# Patient Record
Sex: Female | Born: 1950 | Race: White | Hispanic: No | Smoking: Former smoker
Health system: Southern US, Community
[De-identification: ages and names within clinical notes are randomized; demographics above are authoritative.]

## PROBLEM LIST (undated history)

## (undated) DIAGNOSIS — I890 Lymphedema, not elsewhere classified: Secondary | ICD-10-CM

## (undated) DIAGNOSIS — Z9189 Other specified personal risk factors, not elsewhere classified: Secondary | ICD-10-CM

## (undated) DIAGNOSIS — R51 Headache: Secondary | ICD-10-CM

## (undated) DIAGNOSIS — J302 Other seasonal allergic rhinitis: Secondary | ICD-10-CM

## (undated) DIAGNOSIS — F419 Anxiety disorder, unspecified: Secondary | ICD-10-CM

## (undated) DIAGNOSIS — K219 Gastro-esophageal reflux disease without esophagitis: Secondary | ICD-10-CM

## (undated) DIAGNOSIS — B009 Herpesviral infection, unspecified: Secondary | ICD-10-CM

## (undated) DIAGNOSIS — M1990 Unspecified osteoarthritis, unspecified site: Secondary | ICD-10-CM

## (undated) DIAGNOSIS — F329 Major depressive disorder, single episode, unspecified: Secondary | ICD-10-CM

## (undated) DIAGNOSIS — C801 Malignant (primary) neoplasm, unspecified: Secondary | ICD-10-CM

## (undated) DIAGNOSIS — Z5111 Encounter for antineoplastic chemotherapy: Secondary | ICD-10-CM

## (undated) DIAGNOSIS — Z923 Personal history of irradiation: Secondary | ICD-10-CM

## (undated) DIAGNOSIS — C50919 Malignant neoplasm of unspecified site of unspecified female breast: Secondary | ICD-10-CM

## (undated) DIAGNOSIS — T7840XA Allergy, unspecified, initial encounter: Secondary | ICD-10-CM

## (undated) DIAGNOSIS — I10 Essential (primary) hypertension: Secondary | ICD-10-CM

## (undated) DIAGNOSIS — Z91B Personal risk factor of exposure to diethylstilbestrol: Secondary | ICD-10-CM

## (undated) HISTORY — DX: Essential (primary) hypertension: I10

## (undated) HISTORY — DX: Gastro-esophageal reflux disease without esophagitis: K21.9

## (undated) HISTORY — DX: Major depressive disorder, single episode, unspecified: F32.9

## (undated) HISTORY — DX: Encounter for antineoplastic chemotherapy: Z51.11

## (undated) HISTORY — DX: Herpesviral infection, unspecified: B00.9

## (undated) HISTORY — DX: Personal risk factor of exposure to diethylstilbestrol: Z91.B

## (undated) HISTORY — DX: Malignant (primary) neoplasm, unspecified: C80.1

## (undated) HISTORY — DX: Other specified personal risk factors, not elsewhere classified: Z91.89

---

## 1997-11-28 ENCOUNTER — Other Ambulatory Visit: Admission: RE | Admit: 1997-11-28 | Discharge: 1997-11-28 | Payer: Self-pay | Admitting: *Deleted

## 1999-03-14 ENCOUNTER — Ambulatory Visit (HOSPITAL_COMMUNITY): Admission: RE | Admit: 1999-03-14 | Discharge: 1999-03-14 | Payer: Self-pay | Admitting: Gastroenterology

## 1999-04-16 ENCOUNTER — Other Ambulatory Visit: Admission: RE | Admit: 1999-04-16 | Discharge: 1999-04-16 | Payer: Self-pay | Admitting: *Deleted

## 1999-11-13 ENCOUNTER — Encounter: Payer: Self-pay | Admitting: *Deleted

## 1999-11-13 ENCOUNTER — Encounter: Admission: RE | Admit: 1999-11-13 | Discharge: 1999-11-13 | Payer: Self-pay | Admitting: *Deleted

## 2002-08-12 ENCOUNTER — Encounter: Admission: RE | Admit: 2002-08-12 | Discharge: 2002-08-12 | Payer: Self-pay | Admitting: *Deleted

## 2002-08-12 ENCOUNTER — Encounter: Payer: Self-pay | Admitting: *Deleted

## 2011-07-10 ENCOUNTER — Ambulatory Visit: Payer: Self-pay | Admitting: Internal Medicine

## 2011-07-10 VITALS — BP 149/91 | HR 77 | Temp 97.7°F | Resp 16 | Ht 63.5 in | Wt 160.0 lb

## 2011-07-10 DIAGNOSIS — N6459 Other signs and symptoms in breast: Secondary | ICD-10-CM

## 2011-07-10 DIAGNOSIS — Z719 Counseling, unspecified: Secondary | ICD-10-CM

## 2011-07-10 DIAGNOSIS — N6452 Nipple discharge: Secondary | ICD-10-CM

## 2011-07-10 DIAGNOSIS — N63 Unspecified lump in unspecified breast: Secondary | ICD-10-CM

## 2011-07-10 DIAGNOSIS — N644 Mastodynia: Secondary | ICD-10-CM

## 2011-07-10 DIAGNOSIS — Z789 Other specified health status: Secondary | ICD-10-CM | POA: Insufficient documentation

## 2011-07-10 NOTE — Patient Instructions (Signed)
Galactorrhea Galactorrhea is when there is a milky nipple discharge. It is different from normal milk in nursing mothers. It usually comes from both nipples. Galactorrhea is not a disease but may be a symptom of a problem. It may continue for years after weaning. Galactorrhea is caused by the hormone prolactin, which stimulates milk production. If the breast discharge looks like pus, is bloody or if there is a lump present in the affected breast, the discharge may be caused by other problems including:  A benign cyst.   Papilloma.   Breast cancer.   A breast infection.   A breast abscess.  It can also be seen in men who have a low or absent female hormone (testosterone) level. Galactorrhea can be present in a newborn if the mother had high female hormone (estrogen) levels that crossed into the baby through the placenta. The baby usually has enlarged breasts, but in time, it all goes away on its own. CAUSES   Tumor of the pituitary gland in the brain.   Problems with the hypothalamus in the brain that stimulates the pituitary gland.   Low thyroid function (hypothyroid disease).   Chronic kidney failure.   Medications, antidepressants, tranquilizers and blood pressure medication.   Herbal medications (nettle, fennel, blessed thistle, anise and fenugreek seed).   Illegal drugs (marijuana and opiates).   Breast stimulation during sexual activity or too many and frequent self breast exams.   Birth control pills.   Surgery or trauma to the breast causing nerve damage.   Spinal cord injury.  SYMPTOMS   White, yellow or green discharge from one or both breasts.   No menstrual period (amenorrhea) or infrequent menstrual periods (hypomenorrhea).   Hot flashes, lack of sexual desire or vaginal dryness.   Infertility in women and men.   Headaches and vision problems.   Decrease in calcium in your bones (developing osteopenia or osteoporosis).  DIAGNOSIS  Your caregiver may be able  to know your problem by taking a detailed history and physical exam of you. Tests that may be done, include:  Blood tests to check for the prolactin hormone, your female and thyroid hormones and a pregnancy test.   A detailed eye exam.   Mammogram.   X-rays, CT scan or MRI of breasts or your brain looking for a tumor.  TREATMENT   Stopping medications that may be causing the galactorrhea.   Treating low thyroid function with thyroid hormones.   Medical or surgical (if necessary) treatment of a pituitary gland tumor.   Medication to lower the prolactin hormone level when no cause can be found.   Surgery as a last resort to remove the breasts ducts if the discharge persists with treatment and is a problem.   Treatment may not be necessary if you are not bothered by the breast discharge.  HOME CARE INSTRUCTIONS   Before seeing your caregiver, make a list of all your symptoms, medications, when the breast discharge started and questions you may have.   Avoid breast stimulation during sexual activity.   Perform breast self exam once a month.   Avoid clothes that rub on your nipples.   Use breasts pads to absorb the discharge.   Wear a support bra.  SEEK MEDICAL CARE IF:   You have galactorrhea and you are trying to get pregnant.   You develop hot flashes, vaginal dryness or lack of sexual desire.   You stop having menstrual periods or they are irregular or far apart.     You have headaches.   You have vision problems.  SEEK IMMEDIATE MEDICAL CARE IF:   Your breast discharge is bloody or pus-like.   You have breast pain.   You feel a lump in your breast.   Your breast shows wrinkling or dimpling.   Your breast becomes red and swollen.  Document Released: 05/22/2004 Document Revised: 04/03/2011 Document Reviewed: 04/04/2008 Exit[employer] Patient Information 2012 [employer]. 

## 2011-07-10 NOTE — Progress Notes (Signed)
  Subjective:    Patient ID: Rachael Weaver, female    DOB: August 04, 1950, 61 y.o.   MRN: [id number]  HPI Has 3 weeks of new breast sxs. Has discharge and pain R breast. Repeat BP 146/96. Exercises daily, no smoke , eats healthy.   Review of Systems Migrains controlled OTC    Objective:   Physical Exam  Constitutional: She is oriented to person, place, and time. She appears well-developed and well-nourished.  Eyes: EOM are normal.  Neck: Normal range of motion. Neck supple. No thyromegaly present.  Cardiovascular: Normal rate, regular rhythm and normal heart sounds.   Pulmonary/Chest: Effort normal and breath sounds normal. Right breast exhibits mass, nipple discharge and tenderness. Right breast exhibits no inverted nipple and no skin change. Left breast exhibits tenderness. Left breast exhibits no inverted nipple, no mass, no nipple discharge and no skin change.       Both breasts appear normal to inspection. R breast has a superior mobile , irregular, tender, mass. L normal  Neurological: She is alert and oriented to person, place, and time.  Skin: Skin is warm and dry. No rash noted.  Psychiatric: She has a normal mood and affect.          Assessment & Plan:   Order stat US and diagnostic mammogram Schedule CPE Monitor home BP DASH diet

## 2011-07-18 ENCOUNTER — Ambulatory Visit
Admission: RE | Admit: 2011-07-18 | Discharge: 2011-07-18 | Disposition: A | Discharge status: Home / Self Care | Payer: Self-pay | Source: Ambulatory Visit | Attending: Internal Medicine | Admitting: Internal Medicine

## 2011-07-18 ENCOUNTER — Other Ambulatory Visit: Payer: Self-pay | Admitting: Internal Medicine

## 2011-07-18 ENCOUNTER — Other Ambulatory Visit: Payer: Self-pay | Admitting: Obstetrics and Gynecology

## 2011-07-18 DIAGNOSIS — N6452 Nipple discharge: Secondary | ICD-10-CM

## 2011-07-18 DIAGNOSIS — N63 Unspecified lump in unspecified breast: Secondary | ICD-10-CM

## 2011-07-18 DIAGNOSIS — N6310 Unspecified lump in the right breast, unspecified quadrant: Secondary | ICD-10-CM

## 2011-07-22 ENCOUNTER — Other Ambulatory Visit: Payer: Self-pay | Admitting: Obstetrics and Gynecology

## 2011-07-22 ENCOUNTER — Ambulatory Visit
Admission: RE | Admit: 2011-07-22 | Discharge: 2011-07-22 | Disposition: A | Discharge status: Home / Self Care | Payer: No Typology Code available for payment source | Source: Ambulatory Visit | Attending: Obstetrics and Gynecology | Admitting: Obstetrics and Gynecology

## 2011-07-22 ENCOUNTER — Ambulatory Visit (INDEPENDENT_AMBULATORY_CARE_PROVIDER_SITE_OTHER): Payer: Self-pay | Admitting: *Deleted

## 2011-07-22 VITALS — BP 142/94 | HR 85 | Temp 98.0°F | Ht 64.0 in | Wt 161.2 lb

## 2011-07-22 DIAGNOSIS — Z01419 Encounter for gynecological examination (general) (routine) without abnormal findings: Secondary | ICD-10-CM

## 2011-07-22 DIAGNOSIS — N63 Unspecified lump in unspecified breast: Secondary | ICD-10-CM

## 2011-07-22 DIAGNOSIS — N6310 Unspecified lump in the right breast, unspecified quadrant: Secondary | ICD-10-CM

## 2011-07-22 NOTE — Patient Instructions (Addendum)
Taught patient how to perform BSE. Let her know [facility] will cover Pap smears every 3 years unless has a history of abnormal Pap smears. Patient is scheduled for a right breast biopsy this afternoon at 1400. Patient aware of appointment and will be there. Let patient know will follow up with her within the next couple weeks with results.Talked with patient about blood pressure since elevated today. Per patient it has been running around 140/90 and spoken to her doctor about that. Patient encouraged to check blood pressure regularly and to follow up with PCP. Patient verbalized understanding.

## 2011-07-22 NOTE — Progress Notes (Signed)
Complaints of right breast lump. Patient referred from the [facility] [place]. Diagnostic mammogram was completed 07/18/11.  Pap Smear:    Pap smear completed today. Per patient last Pap smear was 6 years ago and normal. Per patient no history of abnormal Pap smears. Patient stated her mother took DES while pregnant for her and has had multiple colposcopies due to that. No Pap smear results in EPIC.  Physical exam: Breasts Breasts symmetrical. No skin abnormalities bilateral breasts. No nipple retraction bilateral breasts. No nipple discharge bilateral breasts. Patient stated she has had some clear right nipple discharge a couple of weeks ago. No lymphadenopathy. No lumps palpated left breast. Palpated lump in right breast at 12 o'clock 7 cm from the nipple. Patient complained of pain on palpation of lump. Patient scheduled for a right breast biopsy today at 1400 at the [facility] [place].          Pelvic/Bimanual   Ext Genitalia No lesions, no swelling and no discharge observed on external genitalia.         Vagina Vagina pink and normal texture. No lesions or discharge observed in vagina.          Cervix Cervix is present. Cervix pink and of normal texture. No discharge observed.     Uterus Uterus is present and palpable. Uterus in normal position and normal size.        Adnexae Bilateral ovaries present and palpable. No tenderness on palpation.          Rectovaginal No rectal exam completed today since patient had no rectal complaints. No skin abnormalities observed on exam.

## 2011-07-23 ENCOUNTER — Other Ambulatory Visit: Payer: Self-pay | Admitting: Obstetrics and Gynecology

## 2011-07-23 DIAGNOSIS — C50911 Malignant neoplasm of unspecified site of right female breast: Secondary | ICD-10-CM

## 2011-07-24 ENCOUNTER — Telehealth: Payer: Self-pay | Admitting: *Deleted

## 2011-07-24 ENCOUNTER — Other Ambulatory Visit: Payer: Self-pay | Admitting: *Deleted

## 2011-07-24 DIAGNOSIS — C50419 Malignant neoplasm of upper-outer quadrant of unspecified female breast: Secondary | ICD-10-CM

## 2011-07-24 DIAGNOSIS — C50411 Malignant neoplasm of upper-outer quadrant of right female breast: Secondary | ICD-10-CM | POA: Insufficient documentation

## 2011-07-24 DIAGNOSIS — Z170 Estrogen receptor positive status [ER+]: Secondary | ICD-10-CM | POA: Insufficient documentation

## 2011-07-24 NOTE — Telephone Encounter (Signed)
Confirmed [facility] for 07/30/11 at 0800.  Instructions and contact information given.  

## 2011-07-25 ENCOUNTER — Ambulatory Visit
Admission: RE | Admit: 2011-07-25 | Discharge: 2011-07-25 | Disposition: A | Discharge status: Home / Self Care | Payer: No Typology Code available for payment source | Source: Ambulatory Visit | Attending: Obstetrics and Gynecology | Admitting: Obstetrics and Gynecology

## 2011-07-25 DIAGNOSIS — C50911 Malignant neoplasm of unspecified site of right female breast: Secondary | ICD-10-CM

## 2011-07-25 MED ORDER — GADOBENATE DIMEGLUMINE 529 MG/ML IV SOLN
15.0000 mL | Freq: Once | INTRAVENOUS | Status: AC | PRN
  Administered 2011-07-25: 15 mL via INTRAVENOUS

## 2011-07-30 ENCOUNTER — Encounter: Payer: Self-pay | Admitting: Oncology

## 2011-07-30 ENCOUNTER — Ambulatory Visit (HOSPITAL_BASED_OUTPATIENT_CLINIC_OR_DEPARTMENT_OTHER): Payer: Self-pay | Admitting: Oncology

## 2011-07-30 ENCOUNTER — Ambulatory Visit: Payer: Medicaid Other | Attending: Surgery | Admitting: Physical Therapy

## 2011-07-30 ENCOUNTER — Other Ambulatory Visit (INDEPENDENT_AMBULATORY_CARE_PROVIDER_SITE_OTHER): Payer: Self-pay | Admitting: Surgery

## 2011-07-30 ENCOUNTER — Ambulatory Visit: Payer: Self-pay

## 2011-07-30 ENCOUNTER — Other Ambulatory Visit: Payer: Self-pay | Admitting: Lab

## 2011-07-30 ENCOUNTER — Encounter: Payer: Self-pay | Admitting: *Deleted

## 2011-07-30 ENCOUNTER — Ambulatory Visit
Admission: RE | Admit: 2011-07-30 | Discharge: 2011-07-30 | Disposition: A | Discharge status: Home / Self Care | Payer: Medicaid Other | Source: Ambulatory Visit | Attending: Radiation Oncology | Admitting: Radiation Oncology

## 2011-07-30 ENCOUNTER — Ambulatory Visit (HOSPITAL_BASED_OUTPATIENT_CLINIC_OR_DEPARTMENT_OTHER): Payer: PRIVATE HEALTH INSURANCE | Admitting: Surgery

## 2011-07-30 VITALS — BP 151/92 | HR 78 | Temp 98.3°F | Ht 64.0 in | Wt 160.9 lb

## 2011-07-30 VITALS — BP 151/92 | HR 78 | Temp 98.3°F | Wt 160.0 lb

## 2011-07-30 DIAGNOSIS — C50419 Malignant neoplasm of upper-outer quadrant of unspecified female breast: Secondary | ICD-10-CM

## 2011-07-30 DIAGNOSIS — IMO0001 Reserved for inherently not codable concepts without codable children: Secondary | ICD-10-CM | POA: Insufficient documentation

## 2011-07-30 DIAGNOSIS — R2230 Localized swelling, mass and lump, unspecified upper limb: Secondary | ICD-10-CM

## 2011-07-30 DIAGNOSIS — R293 Abnormal posture: Secondary | ICD-10-CM | POA: Insufficient documentation

## 2011-07-30 LAB — CBC WITH DIFFERENTIAL/PLATELET
Basophils Absolute: 0 10*3/uL (ref 0.0–0.1)
Eosinophils Absolute: 0.1 10*3/uL (ref 0.0–0.5)
HCT: 44.6 % (ref 34.8–46.6)
HGB: 15.2 g/dL (ref 11.6–15.9)
LYMPH%: 40 % (ref 14.0–49.7)
MCV: 89.9 fL (ref 79.5–101.0)
MONO%: 7.6 % (ref 0.0–14.0)
NEUT#: 3 10*3/uL (ref 1.5–6.5)
NEUT%: 49.9 % (ref 38.4–76.8)
Platelets: 240 10*3/uL (ref 145–400)
RBC: 4.96 10*6/uL (ref 3.70–5.45)

## 2011-07-30 LAB — COMPREHENSIVE METABOLIC PANEL
BUN: 16 mg/dL (ref 6–23)
CO2: 27 mEq/L (ref 19–32)
Calcium: 9.7 mg/dL (ref 8.4–10.5)
Creatinine, Ser: 0.8 mg/dL (ref 0.50–1.10)
Glucose, Bld: 107 mg/dL — ABNORMAL HIGH (ref 70–99)
Total Bilirubin: 0.6 mg/dL (ref 0.3–1.2)

## 2011-07-30 NOTE — Progress Notes (Signed)
Patient came in today as a new patient,she has no insurance.I gave her an epp application to fill out and return to [place].

## 2011-07-30 NOTE — Progress Notes (Signed)
Referral MD Dr Ivor Reining; Dr Patrina Levering Reason for Referral: 61 year old woman who presents with nipple discharge and breast pain.    Chief Complaint  Patient presents with  . Breast Cancer  : This patient has not had mammogram in September 2001. She had presented there because of pain and nipple discharge. Diagnostic mammogram right breast ultrasound on 07/18/2011 showed a spiculated hypoechoic mass at 12:00, 7 cm from the nipple measuring 2.9 x 2.8 x 1.5 cm. This was palpable as well. Biopsy performed on 07/22/2011 showed this to be ER and PR positive at 100 and 59% respectively., Proliferative index 66% and HER-2 positive at 2.82. MRI scan of both breasts performed 07/25/2011 showed a mass measuring 2.9 x 2.6 x 1.8 cm at 12:00 9 cm from nipple. The right axilla there is a level I lymph node measuring 15 mm which is thickened cortex. Was not identified on ultrasound. This will likely require biopsy under MRI guidance.   HPI:   Past Medical History  Diagnosis Date  . Hypertension     borderline- no meds  :  Past Surgical History  Procedure Date  . Cesarean section   :  Current outpatient prescriptions:aspirin-acetaminophen-caffeine (EXCEDRIN MIGRAINE) 250-250-65 MG per tablet, Take 2 tablets by mouth as needed., Disp: , Rfl: ;  fexofenadine (ALLEGRA) 180 MG tablet, Take 180 mg by mouth as needed., Disp: , Rfl: :    :  No Known Allergies:  Family History  Problem Relation Age of Onset  . Diabetes Paternal Grandfather   . Heart disease Paternal Grandfather   . Breast cancer Maternal Grandmother Age 56's  . Cancer Maternal Grandmother   . Hypertension Father   . Stroke Father   . Breast cancer Maternal Aunt Age 23's  . Cancer Maternal Aunt   :  History   Social History  . Marital Status: Single, divorced x 66 y    Spouse Name: N/A    Number of Children: 2- 46, 7 yo  . Years of Education: N/A   Occupational History  . Not on file.  Teaching laboratory technician    Social History Main Topics  . Smoking status: Former Smoker -- 1.0 packs/day    Quit date: 04/29/1971  . Smokeless tobacco: Not on file  . Alcohol Use: 0.0 oz/week    0-1 Glasses of wine per week     rarely  . Drug Use: No  . Sexually Active: Not Currently    Birth Control/ Protection: None   Other Topics Concern  . Not on file   Social History Narrative  . No narrative on file5 brothers, both parents deceased; 1 brother died of HIV  :  @Reproductive  History@ G5P1 Menarche 15 Menopause since 2005 Hx of DEX exposure; extensive f/u till mid 9's A comprehensive review of systems was negative. She has lost 50 lbs over 2 y Exam:  @IPVITALS @ General appearance: alert, cooperative and appears stated age Eyes: conjunctivae/corneas clear. PERRL, EOM's intact. Fundi benign. Throat: lips, mucosa, and tongue normal; teeth and gums normal Resp: clear to auscultation bilaterally and normal percussion bilaterally Breasts: normal appearance, no masses or tenderness, mass ~2 cm upper outer rt breast Cardio: regular rate and rhythm, S1, S2 normal, no murmur, click, rub or gallop and normal apical impulse GI: soft, non-tender; bowel sounds normal; no masses,  no organomegaly Extremities: extremities normal, atraumatic, no cyanosis or edema Pulses: 2+ and symmetric Skin: Skin color, texture, turgor normal. No rashes or lesions Lymph nodes: Cervical, supraclavicular, and  axillary nodes normal. Neurologic: Grossly normal   Basename 07/30/11 0812  WBC 6.1  HGB 15.2  HCT 44.6  PLT 240    Basename 07/30/11 0812  NA 138  K 3.9  CL 102  CO2 27  GLUCOSE 107*  BUN 16  CREATININE 0.80  CALCIUM 9.7    Blood smear review: n/a  Pathology:as above  US Breast Right  07/18/2011  *RADIOLOGY REPORT*  Clinical Data:  Questioned palpable finding right breast 12 o'clock location  DIGITAL DIAGNOSTIC BILATERAL MAMMOGRAM WITH CAD AND RIGHT BREAST ULTRASOUND:  Comparison:  None.  Findings:   The breast parenchymal pattern demonstrates scattered fibroglandular densities.  There is an irregular spiculated mass in the right breast 12 o'clock location at the site of the questioned palpable finding.  No suspicious finding is seen in the left breast.  There is asymmetric benign appearing glandular tissue in the left upper outer quadrant incidentally noted. Mammographic images were processed with CAD.  On physical exam, I palpate a firm mass like thickening in the right breast 12 o'clock location.  Ultrasound is performed, showing a spiculated irregular hypoechoic shadowing mass with a component of adjacent satellite nodules in the right breast 12 o'clock location 7 cm from the nipple measuring 2.9 x 2.8 x 1.5 cm.  This corresponds to the mammographic and palpable finding. No right axillary lymphadenopathy is identified.  IMPRESSION: Suspicious right breast mass, 12 o'clock location.  Ultrasound- guided core biopsy will be performed after the patient is enrolled in the [facility] program.  She has an appointment 07/22/2011 at a.m. at [facility] for program involvement. Findings and recommendations discussed with the patient and provided in written form at the time of the exam.  I also called these findings to Dr. Perrin Maltese at the time of the exam.  BI-RADS CATEGORY 5:  Highly suggestive of malignancy - appropriate action should be taken.  Recommendation:  Right breast biopsy, ultrasound-guided  Original Report Authenticated By: Harrel Lemon, M.D.   Mr Breast Bilateral W Wo Contrast  07/25/2011  *RADIOLOGY REPORT*  Clinical Data: new diagnosis of breast cancer on the right  BUN and creatinine were obtained on site at [facility] at [address] Results:  BUN 14 mg/dL,  Creatinine 0.9 mg/dL.  BILATERAL BREAST MRI WITH AND WITHOUT CONTRAST  Technique: Multiplanar, multisequence MR images of both breasts were obtained prior to and following the intravenous administration of 15ml of Multihance.   Three dimensional images were evaluated at the independent DynaCad workstation.  Comparison:  07/18/11 mammogram  Findings: There is moderate symmetric bilateral background parenchymal enhancement.  There are no suspicious findings in the left breast.  On the right, there is a biopsy marker clip associated with a mass in the 12 o'clock position, 9cm from the nipple.  The mass demonstrates an irregular shape with spiculated margins, rapid initial phase enhancement, and a mixture of different delayed phase kinetics including washout.  The mass measures 29 x 26 x 18mm. There are no other masses in the right breast.  In the upper right axilla, there is a 15mm level I lymph node that demonstrates thickened cortex and loss of normal hilum.  It is hypervascular with rapid enhancment and washout.  IMPRESSION: Known invasive carcinoma in the 12 o'clock right breast, with morphologically suspicious right axillary lymph node.  BI-RADS CATEGORY 6:  Known biopsy-proven malignancy - appropriate action should be taken.  THREE-DIMENSIONAL MR IMAGE RENDERING ON INDEPENDENT WORKSTATION:  Three-dimensional MR images were rendered by post-processing  of the original MR data on an independent workstation.  The three- dimensional MR images were interpreted, and findings were reported in the accompanying complete MRI report for this study.  Original Report Authenticated By: [email or web address]   [place] Core Biopsy  07/23/2011  *RADIOLOGY REPORT*  Clinical Data:  2.9 x 2.8 x 1.5 cm spiculated mass at 12 o'clock 7 cm from the right nipple  ULTRASOUND GUIDED CORE BIOPSY OF THE RIGHT BREAST  The patient and I discussed the procedure of ultrasound-guided biopsy, including benefits and alternatives.  We discussed the high likelihood of a successful procedure. We discussed the risks of the procedure, including infection, bleeding, tissue injury, clip migration and inadequate sampling.  Informed written consent was given.  Using sterile technique, 2% lidocaine,  ultrasound guidance and a 14 gauge automated biopsy device, biopsy was performed of the mass at 12 o'clock in the right breast.  At the conclusion of the procedure, a ribbon tissue marker clip was deployed into the biopsy cavity.  Follow up 2 view mammogram was performed and dictated separately.  Histologic evaluation demonstrates invasive mammary carcinoma which is concordant with the imaging findings.  Report was discussed with the patient by telephone at her request.  Patient is very claustrophobic; she has been scheduled for possible breast MRI on 07/25/2011.  She has been scheduled to be seen in the the [facility] on 07/30/2011.  She reports no complications from the procedure.  IMPRESSION: Ultrasound guided biopsy of a mass at 12 o'clock in the right breast.  Invasive mammary carcinoma is diagnosed.  The patient has been scheduled to be seen in the [facility] on 07/30/2011.  We will attempt to breast MRI on 07/25/2011; the patient is very claustrophobic and may not be able to complete the exam.  No apparent complications.  Original Report Authenticated By: Daryl Eastern, M.D.   Mm Digital Diagnostic Bilat  07/18/2011  *RADIOLOGY REPORT*  Clinical Data:  Questioned palpable finding right breast 12 o'clock location  DIGITAL DIAGNOSTIC BILATERAL MAMMOGRAM WITH CAD AND RIGHT BREAST ULTRASOUND:  Comparison:  None.  Findings:  The breast parenchymal pattern demonstrates scattered fibroglandular densities.  There is an irregular spiculated mass in the right breast 12 o'clock location at the site of the questioned palpable finding.  No suspicious finding is seen in the left breast.  There is asymmetric benign appearing glandular tissue in the left upper outer quadrant incidentally noted. Mammographic images were processed with CAD.  On physical exam, I palpate a firm mass like thickening in the right breast 12 o'clock location.  Ultrasound  is performed, showing a spiculated irregular hypoechoic shadowing mass with a component of adjacent satellite nodules in the right breast 12 o'clock location 7 cm from the nipple measuring 2.9 x 2.8 x 1.5 cm.  This corresponds to the mammographic and palpable finding. No right axillary lymphadenopathy is identified.  IMPRESSION: Suspicious right breast mass, 12 o'clock location.  Ultrasound- guided core biopsy will be performed after the patient is enrolled in the [facility] program.  She has an appointment 07/22/2011 at a.m. at [facility] for program involvement. Findings and recommendations discussed with the patient and provided in written form at the time of the exam.  I also called these findings to Dr. Perrin Maltese at the time of the exam.  BI-RADS CATEGORY 5:  Highly suggestive of malignancy - appropriate action should be taken.  Recommendation:  Right breast biopsy, ultrasound-guided  Original Report Authenticated By: Estevan Oaks  GREEN, M.D.   Mm Digital Diagnostic Unilat R  07/22/2011  *RADIOLOGY REPORT*  Clinical Data:  Ultrasound-guided core needle biopsy of a mass at 12 o'clock in the right breast with clip placement  DIGITAL DIAGNOSTIC RIGHT MAMMOGRAM  Comparison:  None.  Findings:  Films are performed following ultrasound guided biopsy of a spiculated mass at 12 o'clock 7 cm from the right nipple.  The ribbon clip is appropriately positioned.  IMPRESSION: Appropriate clip placement following ultrasound-guided core needle biopsy of a mass at 12 o'clock 7 cm from the right nipple.  Original Report Authenticated By: Daryl Eastern, M.D.    Assessment and Plan:  Pleasant 61 year old woman who presents with ER/PR positive and HER-2 positive breast cancer. With a fairly lengthy discussion today regarding neoadjuvant therapy. She ultimately elect to go ahead with lumpectomy upfront followed by definitive therapy. We will go ahead with imaging studies, 2-D echo, chemotherapy teaching and port placement at  the time of surgery. I will therefore see her after she's had all these interventions to further define our treatment plan. I idea to lead to of chemotherapy side effects and choice of treatment and with Herceptin this setting. We also discussed side effects of hormonal therapy to some degree. She understands to go ahead of all these measures and is anxious to proceed.   45 minutes were spent with this patient half the time and patient-related counseling.  Pierce Crane M.D. FRCP C.

## 2011-07-30 NOTE — Progress Notes (Signed)
Patient ID: Rachael Weaver, female   DOB: 11/23/1950, 61 y.o.   MRN: [id number]  Chief Complaint  Patient presents with  . Breast Cancer    right    HPI Rachael Weaver is a 61 y.o. female.  She was referred by her primary physician for evaluation of a newly diagnosed right breast cancer. The patient has been having some increasing pain in her breasts particularly the right side. This prompted a mammogram and further evaluation has shown an invasive ductal carcinoma, receptor positive, HER-2 positive, 66% Ki-67. She is seen today for discussion about treatment options. She has a family history of breast cancer and is also scheduled to see genetic counselor. She has already been seen by the medical oncologist, Dr. Peter Rubin HPI  Past Medical History  Diagnosis Date  . Hypertension     borderline- no meds    Past Surgical History  Procedure Date  . Cesarean section     Family History  Problem Relation Age of Onset  . Diabetes Paternal Grandfather   . Heart disease Paternal Grandfather   . Breast cancer Maternal Grandmother   . Cancer Maternal Grandmother   . Hypertension Father   . Stroke Father   . Breast cancer Maternal Aunt   . Cancer Maternal Aunt     Social History History  Substance Use Topics  . Smoking status: Former Smoker -- 1.0 packs/day    Quit date: 04/29/1971  . Smokeless tobacco: Not on file  . Alcohol Use: 0.0 oz/week    0-1 Glasses of wine per week     rarely    No Known Allergies  Current Outpatient Prescriptions  Medication Sig Dispense Refill  . aspirin-acetaminophen-caffeine (EXCEDRIN MIGRAINE) 250-250-65 MG per tablet Take 2 tablets by mouth as needed.      . fexofenadine (ALLEGRA) 180 MG tablet Take 180 mg by mouth as needed.        Review of Systems Review of Systems  Constitutional: Negative for fever, chills and unexpected weight change.  HENT: Negative for hearing loss, congestion, sore throat, trouble swallowing and voice change.   Eyes:  Negative for visual disturbance.  Respiratory: Negative for cough and wheezing.   Cardiovascular: Negative for chest pain, palpitations and leg swelling.  Gastrointestinal: Negative for nausea, vomiting, abdominal pain, diarrhea, constipation, blood in stool, abdominal distention and anal bleeding.  Genitourinary: Negative for hematuria, vaginal bleeding and difficulty urinating.  Musculoskeletal: Positive for arthralgias.  Skin: Negative for rash and wound.  Neurological: Negative for seizures, syncope and headaches.  Hematological: Negative for adenopathy. Does not bruise/bleed easily.  Psychiatric/Behavioral: Negative for confusion.    Blood pressure 151/92, pulse 78, temperature 98.3 F (36.8 C), weight 160 lb (72.576 kg).  Physical Exam Wt Readings from Last 3 Encounters:  07/30/11 160 lb (72.576 kg)  07/30/11 160 lb 14.4 oz (72.984 kg)  07/22/11 161 lb 3.2 oz (73.12 kg)   Temp Readings from Last 3 Encounters:  07/30/11 98.3 F (36.8 C)   07/30/11 98.3 F (36.8 C)   07/22/11 98 F (36.7 C) Oral   BP Readings from Last 3 Encounters:  07/30/11 151/92  07/30/11 151/92  07/22/11 142/94   Pulse Readings from Last 3 Encounters:  07/30/11 78  07/30/11 78  07/22/11 85    Physical Exam  Vitals reviewed. Constitutional: She is oriented to person, place, and time. She appears well-developed and well-nourished. No distress.  HENT:  Head: Normocephalic and atraumatic.  Mouth/Throat: Oropharynx is clear and moist.  Eyes: Conjunctivae   and EOM are normal. Pupils are equal, round, and reactive to light. No scleral icterus.  Neck: Normal range of motion. Neck supple. No tracheal deviation present. No thyromegaly present.  Cardiovascular: Normal rate, regular rhythm, normal heart sounds and intact distal pulses.  Exam reveals no gallop and no friction rub.   No murmur heard. Pulmonary/Chest: Effort normal and breath sounds normal. No respiratory distress. She has no wheezes. She  has no rales.         About 3 cm hard area - ? The tumor vs post biopsy hematoma  Abdominal: Soft. Bowel sounds are normal. She exhibits no distension and no mass. There is no tenderness. There is no rebound and no guarding.  Musculoskeletal: Normal range of motion. She exhibits no edema and no tenderness.  Neurological: She is alert and oriented to person, place, and time.  Skin: Skin is warm and dry. No rash noted. She is not diaphoretic. No erythema.  Psychiatric: She has a normal mood and affect. Her behavior is normal. Judgment and thought content normal.    Data Reviewed I have reviewed the radiology films and reports personally and reviewed them with the radiologist. I have reviewed the pathology slides and reports with the pathologist.  Assessment    Clinical stage II right breast cancer, 12:00 position, receptor positive, HER-2 positive Suspicious right axillary lymph node found on MRI    Plan    I think she is a good candidate for a lumpectomy. We need a biopsy of the axillary node to see if this is positive to make a decision about whether she would need to have an axillary dissection or a sentinel node evaluation at the time of surgery. She is going to have chemotherapy and a decision will need to be made by her about whether she wishes to do neoadjuvant chemotherapy or proceed directly to lumpectomy and node dissection. She'll need a port for chemotherapy. I have explained the pathophysiology and staging of breast cancer with particular attention to her exact situation. We discussed the multidisciplinary approach to breast cancer which often includes both medical and radiation oncology consultations.  We also discussed surgical options for the treatment of breast cancer including lumpectomy and mastectomy with possible reconstructive surgery. In addition we talked about the evaluation and management of lymph nodes including a description of sentinel lymph node biopsy and  axillary dissections. We reviewed potential complications and risks including bleeding, infection, numbness,  lymphedema, and the potential need for additional surgery.  She understands that for patients who are candidate for lumpectomy or mastectomy there is an equal survival rate with either technique, but a slightly higher local recurrence rate with lumpectomy. In addition she knows that a lumpectomy usually requires postoperative radiation as part of the management of the breast cancer.  We have discussed the likely postoperative course and plans for followup.  I have given the patient some written information that reviewed all of these issues. I believe her questions are answered and that she has a good understanding of the issues.  She has discussed the issues with Dr Wentworth and Dr Rubin and would like to proceed with lumpectomy,and either sln or node dissection depending on the findings of the node biopsy. She will also need a portacath     CC:[name], CHRIS WARREN, MD, MD      [place] J 07/30/2011, 11:39 AM    

## 2011-07-30 NOTE — Progress Notes (Signed)
Radiation Oncology         [phone] ________________________________  Initial outpatient Consultation  Name: Rachael Weaver MRN: [id number]  Date: 07/30/2011  DOB: [birth year]  WU:JWJXB, [name], MD, MD  Streck, Reola Mosher, MD   REFERRING PHYSICIAN: Currie Paris, MD  DIAGNOSIS: Right breast cancer  HISTORY OF PRESENT ILLNESS::Rachael Weaver is a 61 y.o. female who is   Who is referred today for my opinion regarding radiation in the management of her newly diagnosed right breast cancer.  She has been having increasing tenderness and her bilateral breasts. The pain seemed to be worse on the right which prompted a mammogram. This showed a 2.9 x 2.8 x 1.5 cm mass. A biopsy of this area showed a grade 2 invasive ductal carcinoma. This tumor is ER positive HER-2 positive with a Ki 67 of 66%. Due to her family history of cancer she has been referred to see genetic counselor. Prior to this she had no other breast related complaints. She does state that her breasts have always been tender. An MRI of the bilateral breast showed this area to measure 2.9 x 2.6 x 1.8 cm. An enlarged right axillary lymph node was noted measuring 1.5 cm. This lymph node has not been biopsied.  PREVIOUS RADIATION THERAPY: No  PAST MEDICAL HISTORY:  has a past medical history of Hypertension.    PAST SURGICAL HISTORY: Past Surgical History  Procedure Date  . Cesarean section     FAMILY HISTORY: family history includes Breast cancer in her maternal aunt and maternal grandmother; Cancer in her maternal aunt and maternal grandmother; Diabetes in her paternal grandfather; Heart disease in her paternal grandfather; Hypertension in her father; and Stroke in her father.  SOCIAL HISTORY:  reports that she quit smoking about 40 years ago. She does not have any smokeless tobacco history on file. She reports that she drinks alcohol. She reports that she does not use illicit drugs.  ALLERGIES: Review of patient's allergies  indicates no known allergies.  MEDICATIONS:  Current Outpatient Prescriptions  Medication Sig Dispense Refill  . aspirin-acetaminophen-caffeine (EXCEDRIN MIGRAINE) 250-250-65 MG per tablet Take 2 tablets by mouth as needed.      . fexofenadine (ALLEGRA) 180 MG tablet Take 180 mg by mouth as needed.        REVIEW OF SYSTEMS:  A 15 point review of systems is documented in the electronic medical record. This was obtained by the nursing staff. However, I reviewed this with the patient to discuss relevant findings and make appropriate changes.  Pertinent items are noted in HPI.   PHYSICAL EXAM:   Well nourished, no distress. Appears stated age.  No palpable axillary, cervical, supraclavicular lymph nodes Palpable right upper quadrant mass with biopsy change No abnormalities of the left breast.    LABORATORY DATA:  Lab Results  Component Value Date   WBC 6.1 07/30/2011   HGB 15.2 07/30/2011   HCT 44.6 07/30/2011   MCV 89.9 07/30/2011   PLT 240 07/30/2011   Lab Results  Component Value Date   NA 138 07/30/2011   K 3.9 07/30/2011   CL 102 07/30/2011   CO2 27 07/30/2011   Lab Results  Component Value Date   ALT 15 07/30/2011   AST 21 07/30/2011   ALKPHOS 98 07/30/2011   BILITOT 0.6 07/30/2011     RADIOGRAPHY: US Breast Right  07/18/2011  *RADIOLOGY REPORT*  Clinical Data:  Questioned palpable finding right breast 12 o'clock location  DIGITAL DIAGNOSTIC BILATERAL MAMMOGRAM  WITH CAD AND RIGHT BREAST ULTRASOUND:  Comparison:  None.  Findings:  The breast parenchymal pattern demonstrates scattered fibroglandular densities.  There is an irregular spiculated mass in the right breast 12 o'clock location at the site of the questioned palpable finding.  No suspicious finding is seen in the left breast.  There is asymmetric benign appearing glandular tissue in the left upper outer quadrant incidentally noted. Mammographic images were processed with CAD.  On physical exam, I palpate a firm mass like thickening in the  right breast 12 o'clock location.  Ultrasound is performed, showing a spiculated irregular hypoechoic shadowing mass with a component of adjacent satellite nodules in the right breast 12 o'clock location 7 cm from the nipple measuring 2.9 x 2.8 x 1.5 cm.  This corresponds to the mammographic and palpable finding. No right axillary lymphadenopathy is identified.  IMPRESSION: Suspicious right breast mass, 12 o'clock location.  Ultrasound- guided core biopsy will be performed after the patient is enrolled in the [facility] program.  She has an appointment 07/22/2011 at a.m. at [facility] for program involvement. Findings and recommendations discussed with the patient and provided in written form at the time of the exam.  I also called these findings to Dr. Perrin Maltese at the time of the exam.  BI-RADS CATEGORY 5:  Highly suggestive of malignancy - appropriate action should be taken.  Recommendation:  Right breast biopsy, ultrasound-guided  Original Report Authenticated By: Harrel Lemon, M.D.   Mr Breast Bilateral W Wo Contrast  07/25/2011  *RADIOLOGY REPORT*  Clinical Data: new diagnosis of breast cancer on the right  BUN and creatinine were obtained on site at [facility] at [address] Results:  BUN 14 mg/dL,  Creatinine 0.9 mg/dL.  BILATERAL BREAST MRI WITH AND WITHOUT CONTRAST  Technique: Multiplanar, multisequence MR images of both breasts were obtained prior to and following the intravenous administration of 15ml of Multihance.  Three dimensional images were evaluated at the independent DynaCad workstation.  Comparison:  07/18/11 mammogram  Findings: There is moderate symmetric bilateral background parenchymal enhancement.  There are no suspicious findings in the left breast.  On the right, there is a biopsy marker clip associated with a mass in the 12 o'clock position, 9cm from the nipple.  The mass demonstrates an irregular shape with spiculated margins, rapid initial phase enhancement, and a  mixture of different delayed phase kinetics including washout.  The mass measures 29 x 26 x 18mm. There are no other masses in the right breast.  In the upper right axilla, there is a 15mm level I lymph node that demonstrates thickened cortex and loss of normal hilum.  It is hypervascular with rapid enhancment and washout.  IMPRESSION: Known invasive carcinoma in the 12 o'clock right breast, with morphologically suspicious right axillary lymph node.  BI-RADS CATEGORY 6:  Known biopsy-proven malignancy - appropriate action should be taken.  THREE-DIMENSIONAL MR IMAGE RENDERING ON INDEPENDENT WORKSTATION:  Three-dimensional MR images were rendered by post-processing of the original MR data on an independent workstation.  The three- dimensional MR images were interpreted, and findings were reported in the accompanying complete MRI report for this study.  Original Report Authenticated By: [email or web address]   [place] Core Biopsy  07/23/2011  *RADIOLOGY REPORT*  Clinical Data:  2.9 x 2.8 x 1.5 cm spiculated mass at 12 o'clock 7 cm from the right nipple  ULTRASOUND GUIDED CORE BIOPSY OF THE RIGHT BREAST  The patient and I discussed the procedure of ultrasound-guided biopsy, including benefits  and alternatives.  We discussed the high likelihood of a successful procedure. We discussed the risks of the procedure, including infection, bleeding, tissue injury, clip migration and inadequate sampling.  Informed written consent was given.  Using sterile technique, 2% lidocaine, ultrasound guidance and a 14 gauge automated biopsy device, biopsy was performed of the mass at 12 o'clock in the right breast.  At the conclusion of the procedure, a ribbon tissue marker clip was deployed into the biopsy cavity.  Follow up 2 view mammogram was performed and dictated separately.  Histologic evaluation demonstrates invasive mammary carcinoma which is concordant with the imaging findings.  Report was discussed with the patient by telephone at her request.   Patient is very claustrophobic; she has been scheduled for possible breast MRI on 07/25/2011.  She has been scheduled to be seen in the the [facility] on 07/30/2011.  She reports no complications from the procedure.  IMPRESSION: Ultrasound guided biopsy of a mass at 12 o'clock in the right breast.  Invasive mammary carcinoma is diagnosed.  The patient has been scheduled to be seen in the [facility] on 07/30/2011.  We will attempt to breast MRI on 07/25/2011; the patient is very claustrophobic and may not be able to complete the exam.  No apparent complications.  Original Report Authenticated By: Daryl Eastern, M.D.   Mm Digital Diagnostic Bilat  07/18/2011  *RADIOLOGY REPORT*  Clinical Data:  Questioned palpable finding right breast 12 o'clock location  DIGITAL DIAGNOSTIC BILATERAL MAMMOGRAM WITH CAD AND RIGHT BREAST ULTRASOUND:  Comparison:  None.  Findings:  The breast parenchymal pattern demonstrates scattered fibroglandular densities.  There is an irregular spiculated mass in the right breast 12 o'clock location at the site of the questioned palpable finding.  No suspicious finding is seen in the left breast.  There is asymmetric benign appearing glandular tissue in the left upper outer quadrant incidentally noted. Mammographic images were processed with CAD.  On physical exam, I palpate a firm mass like thickening in the right breast 12 o'clock location.  Ultrasound is performed, showing a spiculated irregular hypoechoic shadowing mass with a component of adjacent satellite nodules in the right breast 12 o'clock location 7 cm from the nipple measuring 2.9 x 2.8 x 1.5 cm.  This corresponds to the mammographic and palpable finding. No right axillary lymphadenopathy is identified.  IMPRESSION: Suspicious right breast mass, 12 o'clock location.  Ultrasound- guided core biopsy will be performed after the patient is enrolled in the B[facility]CP  program.  She has an appointment 07/22/2011 at a.m. at [facility] for program involvement. Findings and recommendations discussed with the patient and provided in written form at the time of the exam.  I also called these findings to Dr. Perrin Maltese at the time of the exam.  BI-RADS CATEGORY 5:  Highly suggestive of malignancy - appropriate action should be taken.  Recommendation:  Right breast biopsy, ultrasound-guided  Original Report Authenticated By: Harrel Lemon, M.D.   Mm Digital Diagnostic Unilat R  07/22/2011  *RADIOLOGY REPORT*  Clinical Data:  Ultrasound-guided core needle biopsy of a mass at 12 o'clock in the right breast with clip placement  DIGITAL DIAGNOSTIC RIGHT MAMMOGRAM  Comparison:  None.  Findings:  Films are performed following ultrasound guided biopsy of a spiculated mass at 12 o'clock 7 cm from the right nipple.  The ribbon clip is appropriately positioned.  IMPRESSION: Appropriate clip placement following ultrasound-guided core needle biopsy of a mass at 12  o'clock 7 cm from the right nipple.  Original Report Authenticated By: Daryl Eastern, M.D.      IMPRESSION: Stage II breast cancer  PLAN: Rachael Weaver is a pleasant 61 year old female with a newly diagnosed right breast mass. Dr. Jamey Ripa does think she is a good candidate for upfront lumpectomy. We do however need to know the status of this enlarged lymph node in that scheduled a biopsy of this area. I believe she is leaning towards upfront surgery. She honestly would be a candidate for neoadjuvant chemotherapy if this area it needs to shrink down to ensure adequate origins. Due to her HER-2 positive disease she will require chemotherapy regardless. We discussed the equivalence in terms of survival between lumpectomy and radiation versus mastectomy. We discussed that if she elected for mastectomy she wouldn't need radiation if this lymph node turned out to be positive. He discussed that radiation decreases local failure. We  discussed the difference between local and systemic treatment options. We discussed the process of simulation and the placement of tattoos. We discussed possible side effects of treatment including but not limited to skin redness, pain and lung damage. We discussed that radiation would occur after her surgery and chemotherapy. I will plan on seeing her back after her surgery and chemotherapy. I spent 60 minutes minutes face to face with the patient and more than 50% of that time was spent in counseling and/or coordination of care.   -----------------------------------------------

## 2011-07-31 ENCOUNTER — Encounter: Payer: Self-pay | Admitting: *Deleted

## 2011-07-31 ENCOUNTER — Encounter: Payer: Self-pay | Admitting: Specialist

## 2011-07-31 NOTE — Progress Notes (Signed)
I saw patient in multi-disciplinary breast clinic on 07/30/11.  She had not completed the distress screening, but she told me she would rate her distress at a "2".  She has some concern regarding her two teenage children and their response to her cancer, but otherwise, she seemed calm and determined to keep a positive attitude.  I gave March the support program calendar and told her about the breast cancer support group.  She also requested that I make a referral for her to [facility].

## 2011-07-31 NOTE — Progress Notes (Signed)
Mailed after appt letter to pt. 

## 2011-08-01 ENCOUNTER — Ambulatory Visit
Admission: RE | Admit: 2011-08-01 | Discharge: 2011-08-01 | Disposition: A | Discharge status: Home / Self Care | Payer: No Typology Code available for payment source | Source: Ambulatory Visit | Attending: Surgery | Admitting: Surgery

## 2011-08-01 ENCOUNTER — Other Ambulatory Visit (INDEPENDENT_AMBULATORY_CARE_PROVIDER_SITE_OTHER): Payer: Self-pay | Admitting: Surgery

## 2011-08-01 DIAGNOSIS — R2230 Localized swelling, mass and lump, unspecified upper limb: Secondary | ICD-10-CM

## 2011-08-04 ENCOUNTER — Encounter: Payer: Self-pay | Admitting: *Deleted

## 2011-08-04 ENCOUNTER — Telehealth: Payer: Self-pay | Admitting: *Deleted

## 2011-08-04 NOTE — Telephone Encounter (Signed)
patient confirmed over the phone all appointments for 08-12-2011 and 08-26-2011

## 2011-08-04 NOTE — Telephone Encounter (Signed)
Spoke to pt concerning [facility] from 4/3.  Pt denies questions or concerns regarding dx or treatment care plan.  Encourage pt to call with needs.  Informed pt she would be hearing from our schedulers about dates and times for CT/Bone Scan, echo, chemo class, and f/u appt with Dr. Donnie Coffin.  Received verbal understanding.  Contact information given.

## 2011-08-07 ENCOUNTER — Ambulatory Visit (HOSPITAL_BASED_OUTPATIENT_CLINIC_OR_DEPARTMENT_OTHER): Payer: Self-pay | Admitting: Genetic Counselor

## 2011-08-07 DIAGNOSIS — Z170 Estrogen receptor positive status [ER+]: Secondary | ICD-10-CM

## 2011-08-07 DIAGNOSIS — C50919 Malignant neoplasm of unspecified site of unspecified female breast: Secondary | ICD-10-CM

## 2011-08-07 NOTE — Progress Notes (Signed)
Dr. Donnie Coffin requested a consultation for genetic counseling and risk assessment for Rachael Weaver, a 61 y.o. female, for discussion of her breast cancer. She presents to clinic today to discuss the possibility of a genetic predisposition to cancer, and to further clarify her risks, as well as her family members' risks for cancer.   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 This will be treated with surgery on August 21, 2011 and chemotherapy.    Past Medical History  Diagnosis Date  . Hypertension     borderline- no meds    Past Surgical History  Procedure Date  . Cesarean section     History  Substance Use Topics  . Smoking status: Former Smoker -- 1.0 packs/day    Quit date: 04/29/1971  . Smokeless tobacco: Not on file  . Alcohol Use: 0.0 oz/week    0-1 Glasses of wine per week     rarely    REPRODUCTIVE HISTORY AND PERSONAL RISK ASSESSMENT FACTORS: Menarche was at age 83.   Post menopausal Uterus Intact: Yes Ovaries Intact: Yes [id number] , first live birth at age 89  She has not previously undergone treatment for infertility.   OCP use: 4 years   She has not used HRT in the past.    FAMILY HISTORY:  We obtained a detailed, 4-generation family history.  Significant diagnoses are listed below: Family History  Problem Relation Age of Onset  . Diabetes Paternal Grandfather   . Heart disease Paternal Grandfather   . Breast cancer Maternal Grandmother   . Cancer Maternal Grandmother   . Hypertension Father   . Stroke Father   . Breast cancer Maternal Aunt   . Cancer Maternal Aunt   The patient was diagnosed with breast cancer at age 57.  Her father was diagnosed with prostate cancer in his 59s-80s.  A paternal cousin was diagnosed with eitehr cervical cancer or uterine cancer in her 3s and died.  The patient's maternal aunt was diagnosed with breast cancer in her 61s and  died in her 62s.  The patient's maternal grandmother was diagnosed with breast cancer at 58.  There is no other reported cancer history.  Patient's maternal ancestors are of [place] and [place] descent, and paternal ancestors are of [place], [place] and [place] descent. There is no reported Ashkenazi Jewish ancestry. There is no known consanguinity.  GENETIC COUNSELING RISK ASSESSMENT, DISCUSSION, AND SUGGESTED FOLLOW UP: We reviewed the natural history and genetic etiology of sporadic, familial and hereditary cancer syndromes. Approximately 5-10% of breast cancer is hereditary, and of this, about 85% is the result of a BRCA1 or BRCA2 mutation.  We reviewed the red flags of hereditary breast cancer, and the dominant inheritance pattern seen in hereditary breast cancer.  We discussed that her insurance, currently the [employer] program, does not pay for genetic testing.  She is attempting to get [place] [employer]; however, unfortunately, Medicaid does not cover genetic testing either.  Ms. Vanecek does not qualify for financial assistance through the [employer] program either.    The patient's personal history of breast cancer is suggestive of the following possible diagnosis: hereditary breast and ovarian cancer.  We discussed that identification of a hereditary cancer syndrome may help her care providers tailor the patients medical management. If a mutation indicating hereditary breast cancer is detected in this case, the [employer] recommendations would include increased cancer screening and  possible prophylactic surgery. If a mutation is detected, the patient will be referred back to the referring provider and to any additional appropriate care providers to discuss the relevant options.   If a mutation is not found in the patient, this will decrease the likelihood of hereditary breast cancer as the explanation for her breast cancer. Cancer surveillance options would be discussed for the patient  according to the appropriate standard National Comprehensive Cancer Network and American Cancer Society guidelines, with consideration of their personal and family history risk factors. In this case, the patient will be referred back to their care providers for discussions of management.   In order to estimate her chance of having a BRCA1 or BRCA2 mutation, we used statistical models (Penn II) and laboratory data that take into account her personal medical history, family history and ancestry.  Because each model is different, there can be a lot of variability in the risks they give.  Therefore, these numbers must be considered a rough range and not a precise risk of having a BRCA1 and BRCA2 mutation.  These models estimate that she has approximately a 7% chance of having a mutation. Specifically, there is a 3% chance for a BRCA1 mutation and a 4% chance of a BRCA2 mutation.  Based on this assessment of her family and personal history, genetic testing is recommended.  After considering the risks, benefits, and limitations, the patient decided that she would wait until she either was covered by insurance or qualified for Medicare and reconsider genetic testing at that time..   The patient was seen for a total of 45 minutes, greater than 50% of which was spent face-to-face counseling.  This plan is being carried out per Dr. Renelda Loma recommendations.  This note will also be sent to the referring provider via the electronic medical record. The patient will be supplied with a summary of this genetic counseling discussion as well as educational information on the discussed hereditary cancer syndromes following the conclusion of their visit.   Patient was discussed with Dr. Drue Second.   _______________________________________________________________________ For Office Staff:  Number of people involved in session: 2 Was an Intern/ student involved with case: not applicable

## 2011-08-08 ENCOUNTER — Encounter: Payer: Self-pay | Admitting: Oncology

## 2011-08-08 NOTE — Progress Notes (Signed)
Patient denied for financial assistance (EPP) for family of 3 patient income [phone]

## 2011-08-12 ENCOUNTER — Other Ambulatory Visit: Payer: Self-pay | Admitting: *Deleted

## 2011-08-12 ENCOUNTER — Encounter (HOSPITAL_COMMUNITY)
Admission: RE | Admit: 2011-08-12 | Discharge: 2011-08-12 | Disposition: A | Discharge status: Home / Self Care | Payer: Medicaid Other | Source: Ambulatory Visit | Attending: Oncology | Admitting: Oncology

## 2011-08-12 ENCOUNTER — Encounter: Payer: Self-pay | Admitting: *Deleted

## 2011-08-12 ENCOUNTER — Encounter (HOSPITAL_COMMUNITY): Payer: Self-pay

## 2011-08-12 ENCOUNTER — Telehealth: Payer: Self-pay | Admitting: Oncology

## 2011-08-12 ENCOUNTER — Other Ambulatory Visit: Payer: Self-pay

## 2011-08-12 DIAGNOSIS — C50419 Malignant neoplasm of upper-outer quadrant of unspecified female breast: Secondary | ICD-10-CM

## 2011-08-12 DIAGNOSIS — M25519 Pain in unspecified shoulder: Secondary | ICD-10-CM | POA: Insufficient documentation

## 2011-08-12 DIAGNOSIS — C50919 Malignant neoplasm of unspecified site of unspecified female breast: Secondary | ICD-10-CM | POA: Insufficient documentation

## 2011-08-12 DIAGNOSIS — K7689 Other specified diseases of liver: Secondary | ICD-10-CM | POA: Insufficient documentation

## 2011-08-12 DIAGNOSIS — R599 Enlarged lymph nodes, unspecified: Secondary | ICD-10-CM | POA: Insufficient documentation

## 2011-08-12 DIAGNOSIS — R911 Solitary pulmonary nodule: Secondary | ICD-10-CM | POA: Insufficient documentation

## 2011-08-12 HISTORY — DX: Malignant neoplasm of unspecified site of unspecified female breast: C50.919

## 2011-08-12 MED ORDER — IOHEXOL 300 MG/ML  SOLN
100.0000 mL | Freq: Once | INTRAMUSCULAR | Status: AC | PRN
  Administered 2011-08-12: 100 mL via INTRAVENOUS

## 2011-08-12 MED ORDER — TECHNETIUM TC 99M MEDRONATE IV KIT
25.4000 | PACK | Freq: Once | INTRAVENOUS | Status: AC | PRN
  Administered 2011-08-12: 25.4 via INTRAVENOUS

## 2011-08-12 NOTE — Telephone Encounter (Signed)
S/w the pt and she is aware of her echo appt this Thursday 08/14/2011

## 2011-08-14 ENCOUNTER — Ambulatory Visit (HOSPITAL_COMMUNITY)
Admission: RE | Admit: 2011-08-14 | Discharge: 2011-08-14 | Disposition: A | Discharge status: Home / Self Care | Payer: Medicaid Other | Source: Ambulatory Visit | Attending: Oncology | Admitting: Oncology

## 2011-08-14 DIAGNOSIS — C50419 Malignant neoplasm of upper-outer quadrant of unspecified female breast: Secondary | ICD-10-CM | POA: Insufficient documentation

## 2011-08-14 DIAGNOSIS — Z5111 Encounter for antineoplastic chemotherapy: Secondary | ICD-10-CM

## 2011-08-14 DIAGNOSIS — Z01818 Encounter for other preprocedural examination: Secondary | ICD-10-CM | POA: Insufficient documentation

## 2011-08-14 NOTE — Progress Notes (Signed)
*  PRELIMINARY RESULTS* Echocardiogram 2D Echocardiogram has been performed.  Glean Salen [facility] 08/14/2011, 10:54 AM

## 2011-08-15 ENCOUNTER — Encounter (HOSPITAL_BASED_OUTPATIENT_CLINIC_OR_DEPARTMENT_OTHER): Payer: Self-pay | Admitting: *Deleted

## 2011-08-18 ENCOUNTER — Telehealth: Payer: Self-pay | Admitting: Nutrition

## 2011-08-18 NOTE — Telephone Encounter (Signed)
Scheduled patient for nutrition appointment at her request.

## 2011-08-21 ENCOUNTER — Ambulatory Visit (HOSPITAL_COMMUNITY): Payer: Medicaid Other

## 2011-08-21 ENCOUNTER — Ambulatory Visit
Admission: RE | Admit: 2011-08-21 | Discharge: 2011-08-21 | Disposition: A | Discharge status: Home / Self Care | Payer: Self-pay | Source: Ambulatory Visit | Attending: Surgery | Admitting: Surgery

## 2011-08-21 ENCOUNTER — Ambulatory Visit (HOSPITAL_BASED_OUTPATIENT_CLINIC_OR_DEPARTMENT_OTHER)
Admission: RE | Admit: 2011-08-21 | Discharge: 2011-08-21 | Disposition: A | Discharge status: Home Health | Payer: Medicaid Other | Source: Ambulatory Visit | Attending: Surgery | Admitting: Surgery

## 2011-08-21 ENCOUNTER — Ambulatory Visit (HOSPITAL_COMMUNITY)
Admission: RE | Admit: 2011-08-21 | Discharge: 2011-08-21 | Disposition: A | Discharge status: Home / Self Care | Payer: Medicaid Other | Source: Ambulatory Visit | Attending: Surgery | Admitting: Surgery

## 2011-08-21 ENCOUNTER — Encounter (HOSPITAL_BASED_OUTPATIENT_CLINIC_OR_DEPARTMENT_OTHER)
Admission: RE | Disposition: A | Discharge status: Home Health | Payer: Self-pay | Source: Ambulatory Visit | Attending: Surgery

## 2011-08-21 ENCOUNTER — Encounter (HOSPITAL_BASED_OUTPATIENT_CLINIC_OR_DEPARTMENT_OTHER): Payer: Self-pay | Admitting: Anesthesiology

## 2011-08-21 ENCOUNTER — Ambulatory Visit (HOSPITAL_BASED_OUTPATIENT_CLINIC_OR_DEPARTMENT_OTHER): Payer: Medicaid Other | Admitting: Anesthesiology

## 2011-08-21 DIAGNOSIS — C50419 Malignant neoplasm of upper-outer quadrant of unspecified female breast: Secondary | ICD-10-CM

## 2011-08-21 DIAGNOSIS — C50919 Malignant neoplasm of unspecified site of unspecified female breast: Secondary | ICD-10-CM | POA: Insufficient documentation

## 2011-08-21 DIAGNOSIS — D0590 Unspecified type of carcinoma in situ of unspecified breast: Secondary | ICD-10-CM

## 2011-08-21 DIAGNOSIS — I10 Essential (primary) hypertension: Secondary | ICD-10-CM | POA: Insufficient documentation

## 2011-08-21 DIAGNOSIS — R51 Headache: Secondary | ICD-10-CM | POA: Insufficient documentation

## 2011-08-21 HISTORY — PX: BREAST LUMPECTOMY: SHX2

## 2011-08-21 HISTORY — PX: PORTACATH PLACEMENT: SHX2246

## 2011-08-21 HISTORY — DX: Headache: R51

## 2011-08-21 SURGERY — BREAST LUMPECTOMY WITH NEEDLE LOCALIZATION AND AXILLARY SENTINEL LYMPH NODE BX
Anesthesia: General | Site: Chest | Laterality: Right | Wound class: Clean

## 2011-08-21 MED ORDER — SUCCINYLCHOLINE CHLORIDE 20 MG/ML IJ SOLN
INTRAMUSCULAR | Status: DC | PRN
  Administered 2011-08-21: 100 mg via INTRAVENOUS

## 2011-08-21 MED ORDER — CHLORHEXIDINE GLUCONATE 4 % EX LIQD: 1.0000 "application " | Freq: Once | CUTANEOUS | Status: DC

## 2011-08-21 MED ORDER — BUPIVACAINE HCL (PF) 0.25 % IJ SOLN
INTRAMUSCULAR | Status: DC | PRN
  Administered 2011-08-21: 30 mL

## 2011-08-21 MED ORDER — CEFAZOLIN SODIUM-DEXTROSE 2-3 GM-% IV SOLR
2.0000 g | INTRAVENOUS | Status: AC
  Administered 2011-08-21: 2 g via INTRAVENOUS

## 2011-08-21 MED ORDER — FENTANYL CITRATE 0.05 MG/ML IJ SOLN
100.0000 ug | INTRAMUSCULAR | Status: DC | PRN
  Administered 2011-08-21: 50 ug via INTRAVENOUS

## 2011-08-21 MED ORDER — TECHNETIUM TC 99M SULFUR COLLOID FILTERED: 1.0000 | Freq: Once | INTRAVENOUS | Status: AC | PRN

## 2011-08-21 MED ORDER — HEPARIN SOD (PORK) LOCK FLUSH 100 UNIT/ML IV SOLN
INTRAVENOUS | Status: DC | PRN
  Administered 2011-08-21: 500 [IU] via INTRAVENOUS

## 2011-08-21 MED ORDER — DEXAMETHASONE SODIUM PHOSPHATE 4 MG/ML IJ SOLN
INTRAMUSCULAR | Status: DC | PRN
  Administered 2011-08-21: 10 mg via INTRAVENOUS

## 2011-08-21 MED ORDER — MIDAZOLAM HCL 2 MG/2ML IJ SOLN
2.0000 mg | INTRAMUSCULAR | Status: AC | PRN
  Administered 2011-08-21 (×2): 1 mg via INTRAVENOUS

## 2011-08-21 MED ORDER — HEPARIN (PORCINE) IN NACL 2-0.9 UNIT/ML-% IJ SOLN
INTRAMUSCULAR | Status: DC | PRN
  Administered 2011-08-21: 500 mL via INTRAVENOUS

## 2011-08-21 MED ORDER — PROPOFOL 10 MG/ML IV EMUL
INTRAVENOUS | Status: DC | PRN
  Administered 2011-08-21: 50 mg via INTRAVENOUS
  Administered 2011-08-21: 30 mg via INTRAVENOUS
  Administered 2011-08-21: 20 mg via INTRAVENOUS
  Administered 2011-08-21: 200 mg via INTRAVENOUS

## 2011-08-21 MED ORDER — ACETAMINOPHEN 10 MG/ML IV SOLN
1000.0000 mg | Freq: Once | INTRAVENOUS | Status: AC
  Administered 2011-08-21: 1000 mg via INTRAVENOUS

## 2011-08-21 MED ORDER — ONDANSETRON HCL 4 MG/2ML IJ SOLN
INTRAMUSCULAR | Status: DC | PRN
  Administered 2011-08-21: 4 mg via INTRAVENOUS

## 2011-08-21 MED ORDER — FENTANYL CITRATE 0.05 MG/ML IJ SOLN
INTRAMUSCULAR | Status: DC | PRN
  Administered 2011-08-21 (×2): 25 ug via INTRAVENOUS
  Administered 2011-08-21: 100 ug via INTRAVENOUS

## 2011-08-21 MED ORDER — LACTATED RINGERS IV SOLN
INTRAVENOUS | Status: DC
  Administered 2011-08-21 (×2): via INTRAVENOUS

## 2011-08-21 MED ORDER — HYDROMORPHONE HCL PF 1 MG/ML IJ SOLN: 0.2500 mg | INTRAMUSCULAR | Status: DC | PRN

## 2011-08-21 MED ORDER — DROPERIDOL 2.5 MG/ML IJ SOLN
INTRAMUSCULAR | Status: DC | PRN
  Administered 2011-08-21: 0.625 mg via INTRAVENOUS

## 2011-08-21 MED ORDER — SODIUM CHLORIDE 0.9 % IJ SOLN
INTRAMUSCULAR | Status: DC | PRN
  Administered 2011-08-21: 12:00:00 via INTRADERMAL

## 2011-08-21 MED ORDER — MIDAZOLAM HCL 5 MG/5ML IJ SOLN
INTRAMUSCULAR | Status: DC | PRN
  Administered 2011-08-21: 1 mg via INTRAVENOUS

## 2011-08-21 MED ORDER — HYDROMORPHONE HCL 2 MG PO TABS: 2.0000 mg | ORAL_TABLET | ORAL | Status: DC | PRN

## 2011-08-21 SURGICAL SUPPLY — 81 items
APPLIER CLIP 11 MED OPEN (CLIP)
APPLIER CLIP 9.375 MED OPEN (MISCELLANEOUS)
BAG DECANTER FOR FLEXI CONT (MISCELLANEOUS) ×3 IMPLANT
BENZOIN TINCTURE PRP APPL 2/3 (GAUZE/BANDAGES/DRESSINGS) IMPLANT
BLADE HEX COATED 2.75 (ELECTRODE) ×6 IMPLANT
BLADE SURG 15 STRL LF DISP TIS (BLADE) ×4 IMPLANT
BLADE SURG 15 STRL SS (BLADE) ×2
CANISTER SUCTION 1200CC (MISCELLANEOUS) ×3 IMPLANT
CHLORAPREP W/TINT 26ML (MISCELLANEOUS) ×6 IMPLANT
CLIP APPLIE 11 MED OPEN (CLIP) IMPLANT
CLIP APPLIE 9.375 MED OPEN (MISCELLANEOUS) IMPLANT
CLIP TI MEDIUM 6 (CLIP) IMPLANT
CLIP TI WIDE RED SMALL 6 (CLIP) ×6 IMPLANT
CLOTH BEACON ORANGE TIMEOUT ST (SAFETY) ×3 IMPLANT
COVER MAYO STAND STRL (DRAPES) ×6 IMPLANT
COVER PROBE 5X48 (MISCELLANEOUS)
COVER PROBE W GEL 5X96 (DRAPES) ×3 IMPLANT
COVER TABLE BACK 60X90 (DRAPES) ×6 IMPLANT
DECANTER SPIKE VIAL GLASS SM (MISCELLANEOUS) IMPLANT
DERMABOND ADVANCED (GAUZE/BANDAGES/DRESSINGS) ×2
DERMABOND ADVANCED .7 DNX12 (GAUZE/BANDAGES/DRESSINGS) ×4 IMPLANT
DEVICE DUBIN W/COMP PLATE 8390 (MISCELLANEOUS) ×3 IMPLANT
DRAIN CHANNEL 19F RND (DRAIN) IMPLANT
DRAPE C-ARM 42X72 X-RAY (DRAPES) ×6 IMPLANT
DRAPE LAPAROSCOPIC ABDOMINAL (DRAPES) ×3 IMPLANT
DRAPE LAPAROTOMY TRNSV 102X78 (DRAPE) IMPLANT
DRAPE PED LAPAROTOMY (DRAPES) ×3 IMPLANT
DRAPE UTILITY XL STRL (DRAPES) ×6 IMPLANT
ELECT BLADE 4.0 EZ CLEAN MEGAD (MISCELLANEOUS)
ELECT REM PT RETURN 9FT ADLT (ELECTROSURGICAL) ×3
ELECTRODE BLDE 4.0 EZ CLN MEGD (MISCELLANEOUS) IMPLANT
ELECTRODE REM PT RTRN 9FT ADLT (ELECTROSURGICAL) ×2 IMPLANT
EVACUATOR SILICONE 100CC (DRAIN) IMPLANT
GLOVE BIO SURGEON STRL SZ 6.5 (GLOVE) ×3 IMPLANT
GLOVE BIO SURGEON STRL SZ8 (GLOVE) ×6 IMPLANT
GLOVE BIOGEL PI IND STRL 8 (GLOVE) ×4 IMPLANT
GLOVE BIOGEL PI INDICATOR 8 (GLOVE) ×2
GLOVE EUDERMIC 7 POWDERFREE (GLOVE) ×9 IMPLANT
GOWN PREVENTION PLUS XLARGE (GOWN DISPOSABLE) ×12 IMPLANT
GOWN PREVENTION PLUS XXLARGE (GOWN DISPOSABLE) ×6 IMPLANT
IV CATH PLACEMENT UNIT 16 GA (IV SOLUTION) IMPLANT
IV HEPARIN 1000UNITS/500ML (IV SOLUTION) ×3 IMPLANT
IV KIT MINILOC 20X1 SAFETY (NEEDLE) IMPLANT
KIT BARDPORT ISP (Port) IMPLANT
KIT CVR 48X5XPRB PLUP LF (MISCELLANEOUS) IMPLANT
KIT MARKER MARGIN INK (KITS) ×3 IMPLANT
KIT PORT POWER ISP 8FR (Catheter) IMPLANT
KIT POWER CATH 8FR (Catheter) ×3 IMPLANT
KIT POWER PORT SLIM 6FR (PORTABLE EQUIPMENT SUPPLIES) IMPLANT
NDL SAFETY ECLIPSE 18X1.5 (NEEDLE) ×2 IMPLANT
NEEDLE HYPO 18GX1.5 SHARP (NEEDLE) ×1
NEEDLE HYPO 25X1 1.5 SAFETY (NEEDLE) ×9 IMPLANT
NS IRRIG 1000ML POUR BTL (IV SOLUTION) ×3 IMPLANT
PACK BASIN DAY SURGERY FS (CUSTOM PROCEDURE TRAY) ×6 IMPLANT
PENCIL BUTTON HOLSTER BLD 10FT (ELECTRODE) ×6 IMPLANT
PIN SAFETY STERILE (MISCELLANEOUS) IMPLANT
SET SHEATH INTRODUCER 10FR (MISCELLANEOUS) IMPLANT
SHEATH COOK PEEL AWAY SET 9F (SHEATH) IMPLANT
SHEET MEDIUM DRAPE 40X70 STRL (DRAPES) IMPLANT
SLEEVE SCD COMPRESS KNEE MED (MISCELLANEOUS) ×3 IMPLANT
SPONGE GAUZE 4X4 12PLY (GAUZE/BANDAGES/DRESSINGS) IMPLANT
SPONGE INTESTINAL PEANUT (DISPOSABLE) IMPLANT
SPONGE LAP 18X18 X RAY DECT (DISPOSABLE) IMPLANT
SPONGE LAP 4X18 X RAY DECT (DISPOSABLE) ×6 IMPLANT
STAPLER VISISTAT (STAPLE) IMPLANT
STAPLER VISISTAT 35W (STAPLE) IMPLANT
STRIP CLOSURE SKIN 1/2X4 (GAUZE/BANDAGES/DRESSINGS) IMPLANT
SUT ETHILON 2 0 FS 18 (SUTURE) IMPLANT
SUT ETHILON 3 0 FSL (SUTURE) IMPLANT
SUT MNCRL AB 4-0 PS2 18 (SUTURE) ×6 IMPLANT
SUT PROLENE 2 0 SH DA (SUTURE) ×3 IMPLANT
SUT VIC AB 4-0 BRD 54 (SUTURE) IMPLANT
SUT VICRYL 3-0 CR8 SH (SUTURE) ×6 IMPLANT
SYR 5ML LUER SLIP (SYRINGE) ×3 IMPLANT
SYR BULB 3OZ (MISCELLANEOUS) ×3 IMPLANT
SYR CONTROL 10ML LL (SYRINGE) ×9 IMPLANT
TOWEL OR 17X24 6PK STRL BLUE (TOWEL DISPOSABLE) ×6 IMPLANT
TOWEL OR NON WOVEN STRL DISP B (DISPOSABLE) ×3 IMPLANT
TUBE CONNECTING 20X1/4 (TUBING) ×6 IMPLANT
WATER STERILE IRR 1000ML POUR (IV SOLUTION) IMPLANT
YANKAUER SUCT BULB TIP NO VENT (SUCTIONS) ×6 IMPLANT

## 2011-08-21 NOTE — H&P (View-Only) (Signed)
Patient ID: Rachael Weaver, female   DOB: 08-Nov-1950, 61 y.o.   MRN: [id number]  Chief Complaint  Patient presents with  . Breast Cancer    right    HPI Rachael Weaver is a 61 y.o. female.  She was referred by her primary physician for evaluation of a newly diagnosed right breast cancer. The patient has been having some increasing pain in her breasts particularly the right side. This prompted a mammogram and further evaluation has shown an invasive ductal carcinoma, receptor positive, HER-2 positive, 66% Ki-67. She is seen today for discussion about treatment options. She has a family history of breast cancer and is also scheduled to see genetic counselor. She has already been seen by the medical oncologist, Dr. Pierce Crane HPI  Past Medical History  Diagnosis Date  . Hypertension     borderline- no meds    Past Surgical History  Procedure Date  . Cesarean section     Family History  Problem Relation Age of Onset  . Diabetes Paternal Grandfather   . Heart disease Paternal Grandfather   . Breast cancer Maternal Grandmother   . Cancer Maternal Grandmother   . Hypertension Father   . Stroke Father   . Breast cancer Maternal Aunt   . Cancer Maternal Aunt     Social History History  Substance Use Topics  . Smoking status: Former Smoker -- 1.0 packs/day    Quit date: 04/29/1971  . Smokeless tobacco: Not on file  . Alcohol Use: 0.0 oz/week    0-1 Glasses of wine per week     rarely    No Known Allergies  Current Outpatient Prescriptions  Medication Sig Dispense Refill  . aspirin-acetaminophen-caffeine (EXCEDRIN MIGRAINE) 250-250-65 MG per tablet Take 2 tablets by mouth as needed.      . fexofenadine (ALLEGRA) 180 MG tablet Take 180 mg by mouth as needed.        Review of Systems Review of Systems  Constitutional: Negative for fever, chills and unexpected weight change.  HENT: Negative for hearing loss, congestion, sore throat, trouble swallowing and voice change.   Eyes:  Negative for visual disturbance.  Respiratory: Negative for cough and wheezing.   Cardiovascular: Negative for chest pain, palpitations and leg swelling.  Gastrointestinal: Negative for nausea, vomiting, abdominal pain, diarrhea, constipation, blood in stool, abdominal distention and anal bleeding.  Genitourinary: Negative for hematuria, vaginal bleeding and difficulty urinating.  Musculoskeletal: Positive for arthralgias.  Skin: Negative for rash and wound.  Neurological: Negative for seizures, syncope and headaches.  Hematological: Negative for adenopathy. Does not bruise/bleed easily.  Psychiatric/Behavioral: Negative for confusion.    Blood pressure 151/92, pulse 78, temperature 98.3 F (36.8 C), weight 160 lb (72.576 kg).  Physical Exam Wt Readings from Last 3 Encounters:  07/30/11 160 lb (72.576 kg)  07/30/11 160 lb 14.4 oz (72.984 kg)  07/22/11 161 lb 3.2 oz (73.12 kg)   Temp Readings from Last 3 Encounters:  07/30/11 98.3 F (36.8 C)   07/30/11 98.3 F (36.8 C)   07/22/11 98 F (36.7 C) Oral   BP Readings from Last 3 Encounters:  07/30/11 151/92  07/30/11 151/92  07/22/11 142/94   Pulse Readings from Last 3 Encounters:  07/30/11 78  07/30/11 78  07/22/11 85    Physical Exam  Vitals reviewed. Constitutional: She is oriented to person, place, and time. She appears well-developed and well-nourished. No distress.  HENT:  Head: Normocephalic and atraumatic.  Mouth/Throat: Oropharynx is clear and moist.  Eyes: Conjunctivae  and EOM are normal. Pupils are equal, round, and reactive to light. No scleral icterus.  Neck: Normal range of motion. Neck supple. No tracheal deviation present. No thyromegaly present.  Cardiovascular: Normal rate, regular rhythm, normal heart sounds and intact distal pulses.  Exam reveals no gallop and no friction rub.   No murmur heard. Pulmonary/Chest: Effort normal and breath sounds normal. No respiratory distress. She has no wheezes. She  has no rales.         About 3 cm hard area - ? The tumor vs post biopsy hematoma  Abdominal: Soft. Bowel sounds are normal. She exhibits no distension and no mass. There is no tenderness. There is no rebound and no guarding.  Musculoskeletal: Normal range of motion. She exhibits no edema and no tenderness.  Neurological: She is alert and oriented to person, place, and time.  Skin: Skin is warm and dry. No rash noted. She is not diaphoretic. No erythema.  Psychiatric: She has a normal mood and affect. Her behavior is normal. Judgment and thought content normal.    Data Reviewed I have reviewed the radiology films and reports personally and reviewed them with the radiologist. I have reviewed the pathology slides and reports with the pathologist.  Assessment    Clinical stage II right breast cancer, 12:00 position, receptor positive, HER-2 positive Suspicious right axillary lymph node found on MRI    Plan    I think she is a good candidate for a lumpectomy. We need a biopsy of the axillary node to see if this is positive to make a decision about whether she would need to have an axillary dissection or a sentinel node evaluation at the time of surgery. She is going to have chemotherapy and a decision will need to be made by her about whether she wishes to do neoadjuvant chemotherapy or proceed directly to lumpectomy and node dissection. She'll need a port for chemotherapy. I have explained the pathophysiology and staging of breast cancer with particular attention to her exact situation. We discussed the multidisciplinary approach to breast cancer which often includes both medical and radiation oncology consultations.  We also discussed surgical options for the treatment of breast cancer including lumpectomy and mastectomy with possible reconstructive surgery. In addition we talked about the evaluation and management of lymph nodes including a description of sentinel lymph node biopsy and  axillary dissections. We reviewed potential complications and risks including bleeding, infection, numbness,  lymphedema, and the potential need for additional surgery.  She understands that for patients who are candidate for lumpectomy or mastectomy there is an equal survival rate with either technique, but a slightly higher local recurrence rate with lumpectomy. In addition she knows that a lumpectomy usually requires postoperative radiation as part of the management of the breast cancer.  We have discussed the likely postoperative course and plans for followup.  I have given the patient some written information that reviewed all of these issues. I believe her questions are answered and that she has a good understanding of the issues.  She has discussed the issues with Dr Michell Heinrich and Dr Donnie Coffin and would like to proceed with lumpectomy,and either sln or node dissection depending on the findings of the node biopsy. She will also need a portacath     WU:[id number], [name], MD, MD      Cataleya Cristina J 07/30/2011, 11:39 AM

## 2011-08-21 NOTE — Op Note (Signed)
Rachael Weaver Sep 02, 1950 [id number] 07/30/2011  Preoperative diagnosis: Breast cancer, right, invasive ductal, clinical stage II  Postoperative diagnosis: Same  Procedure: Needle guided right partial mastectomy with blue dye injection and axillary sentinel lymph node excision; Port-A-Cath placement  Surgeon: Currie Paris, MD, F  Anesthesia: General   Clinical History and Indications: This patient has recently been diagnosed with a right breast cancer upper-outer quadrant that she has decided to proceed to surgical excision and sentinel lymph node evaluation. She is also planning postoperative chemotherapy and a port was required.    Description of Procedure: I saw the patient in the preoperative area and confirmed the plans and answered questions. I marked the right breast to the operative side for the lumpectomy. I reviewed the x-rays for the wire localization placement.  The patient was taken to the operating room and after satisfactory general endotracheal anesthesia had been obtained the right breast was prepped and draped in the timeout performed. I had injected blue dye after the initial induction of anesthesia and timeout was performed at that point.  I made a curvilinear incision just below the guidewire entry site. I could palpate what I thought was a tumor. I raised fairly long thin skin flaps in all directions. I divided the breast tissue to the chest wall superior to the mass in medial thin inferior. I was unable to free it up from the underlying muscle taking the muscle fascia. The specimen was manipulated into the wound and I divided the final lateral attachments. By gross inspection I was well around the tumor in all directions. A specimen mammogram showed the clip in the specimen to contain the tumor. This was inked for pathology orientation purposes. The incision was injected with 20 cc of 0.25% plain Marcaine and closed in layers with 3-0 Vicryl, 4-0 Monocryl subcuticular, and  Dermabond.  Using the neoprobe identified a hot area in the right axilla. I divided the skin and subcutaneous tissues and identified axillary fat. Almost immediately I saw it lymphatic leading to a blue lymph node which was excised and had counts of about 4500. Using the neoprobe I found a second area that was hot and found a second lymph node had counts of around [phone] but no blue dye.There were no other abnormal palpable lymph nodes nor any areas of high counts. This incision was closed with 3-0 Vicryl, 4-0 Monocryl subcuticular and Dermabond. 10 cc of 0.25% plain Marcaine was injected here.  The patient was repositioned with the arms tucked. The left breast and upper chest were prepped and draped as a sterile field. Another timeout was performed. The left subclavian vein was entered on the initial attempt and the guidewire threaded but did not advance. Fluoroscopy showed it was curling up so it was removed. Another attempt did not enter the vein but on the third attempt I I entered the vein and easily passed the guidewire using fluoroscopy to confirm its position in the superior vena cava right atrial area.  A transverse skin incision was madeAnd a pocket fashioned with cautery. The catheter tubing was pulled through from the skin pocket to the guidewire site and flushed. Using fluoroscopy I advanced the peel-away sheath and dilator over the guidewire then removed the dilator and guidewire and threaded the catheter to 25 cm. The peel-away sheath was removed. The guidewires back up to 20 cm where it appeared to be in the distal SVC. It aspirated and flushed easily. The reservoir was flushed and attached in a locking mechanism engaged.  This aspirated and flushed easily. It was placed in the pocket and secured with 2-0 Prolene sutures. Fluoroscopy showed good positioning, no kinks, and the tip in the SVC. It was then flushed with concentrated aqueous heparin.  The incision was closed in layers with 3-0  Vicryl, 4-0 Monocryl subcuticular, and Dermabond.  The patient tolerated the procedure well. There were no complications. All counts were correct.  Currie Paris, MD, FACS 08/21/2011 1:58 PM

## 2011-08-21 NOTE — Anesthesia Postprocedure Evaluation (Signed)
  Anesthesia Post-op Note  Patient: Rachael Weaver  Procedure(s) Performed: Procedure(s) (LRB): BREAST LUMPECTOMY WITH NEEDLE LOCALIZATION AND AXILLARY SENTINEL LYMPH NODE BX (Right) INSERTION PORT-A-CATH (Left)  Patient Location: PACU  Anesthesia Type: General  Level of Consciousness: awake and alert   Airway and Oxygen Therapy: Patient Spontanous Breathing  Post-op Pain: mild  Post-op Assessment: Post-op Vital signs reviewed, Patient's Cardiovascular Status Stable, Respiratory Function Stable, Patent Airway and No signs of Nausea or vomiting  Post-op Vital Signs: Reviewed and stable  Complications: No apparent anesthesia complications

## 2011-08-21 NOTE — Interval H&P Note (Signed)
History and Physical Interval Note:  08/21/2011 11:21 AM  Rachael Weaver  has presented today for surgery, with the diagnosis of right breast cancer  The various methods of treatment have been discussed with the patient and family. After consideration of risks, benefits and other options for treatment, the patient has consented to  Procedure(s) (LRB): BREAST LUMPECTOMY WITH NEEDLE LOCALIZATION AND AXILLARY SENTINEL LYMPH NODE BX (N/A) INSERTION PORT-A-CATH (N/A) as a surgical intervention .  The patients' history has been reviewed, patient examined, no change in status, stable for surgery.  I have reviewed the patients' chart and labs.  Questions were answered to the patient's satisfaction.   We had planned a bx of a axillary LN but after additional review it was believed that the node was negative and we should proceed to SLN.  Gjon Letarte J

## 2011-08-21 NOTE — Discharge Instructions (Addendum)
[facility]___[facility] [place] °[phone] ° ° °BREAST BIOPSY/ PARTIAL MASTECTOMY: POST OP INSTRUCTIONS ° °Always review your discharge instruction sheet given to you by the facility where your surgery was performed. ° °IF YOU HAVE DISABILITY OR FAMILY LEAVE FORMS, YOU MUST BRING THEM TO THE OFFICE FOR PROCESSING.  DO NOT GIVE THEM TO YOUR DOCTOR. ° °1. A prescription for pain medication will be given to you upon discharge.  Take your pain medication as prescribed, as needed.  If narcotic pain medicine is not needed, then you may take ibuprofen (Advil) as needed. °2. Take your usually prescribed medications unless otherwise directed °3. If you need a refill on your pain medication, please contact your pharmacy.  They will contact our office to request authorization.  Prescriptions will not be filled after 5pm or on week-ends. °4. You should eat very light the first 24 hours after surgery, such as soup, crackers, pudding, etc.  Resume your normal diet the day after surgery. °5. Most patients will experience some swelling and bruising in the breast.  Ice packs and a good support bra will help.  Swelling and bruising can take several days to resolve.  °6. It is common to experience some constipation if taking pain medication after surgery.  Increasing fluid intake and taking a stool softener will usually help or prevent this problem from occurring.  A mild laxative (Milk of Magnesia or Miralax) should be taken according to package directions if there are no bowel movements after 48 hours. °7. Unless discharge instructions indicate otherwise, you may remove your bandages 24 hours after surgery, and you may shower at that time.  If your surgeon used skin glue on the incision, you may shower in 24 hours.  The glue will flake off over the next 2-3 weeks. °8. DRAINS:  If you have drain, it is important to keep a list of the amount of drainage produced each day in your drains.  Before leaving the hospital, you should  be instructed on drain care.  Call our office if you have any questions about your drains. BE SURE TO BRING THE RECORD OF THE AMOUNT OF DRAINAGE TO YOUR OFFICE VISITS. We use this to determine when the drains can be removed. °9. ACTIVITIES:  You may resume regular daily activities (gradually increasing) beginning the next day.  Wearing a good support bra or sports bra minimizes pain and swelling.  You may have sexual intercourse when it is comfortable. °a. You may drive when you no longer are taking prescription pain medication, you can comfortably wear a seatbelt, and you can safely maneuver your car and apply brakes. °b. RETURN TO WORK:  ______________________________________________________________________________________ °10. You should see your doctor in the office for a follow-up appointment approximately two weeks after your surgery.  Your doctor’s nurse will typically make your follow-up appointment when she calls you with your pathology report.  Expect your pathology report 2-3 business days after your surgery.  You may call to check if you do not hear from us after three days. °11. OTHER INSTRUCTIONS: _______________________________________________________________________________________________ _____________________________________________________________________________________________________________________________________ °_____________________________________________________________________________________________________________________________________ °_____________________________________________________________________________________________________________________________________ ° °WHEN TO CALL YOUR DOCTOR: °1. Fever over 101.0 °2. Nausea and/or vomiting. °3. Extreme swelling or bruising. °4. Continued bleeding from incision. °5. Increased pain, redness, or drainage from the incision. ° °The clinic staff is available to answer your questions during regular business hours.  Please don’t  hesitate to call and ask to speak to one of the nurses for clinical concerns.  If you have a medical emergency, go   to the nearest emergency room or call 911.  A surgeon from [facility] is always on call at the hospital. ° °[address], [address], [place], [place]  [address] ?  °[address] [address], , [place]   [address] °                          [phone] ? [phone] ? FAX [phone] °Web site: [email or web address] ° ° ° °Post Anesthesia Home Care Instructions ° °Activity: °Get plenty of rest for the remainder of the day. A responsible adult should stay with you for 24 hours following the procedure.  °For the next 24 hours, DO NOT: °-Drive a car °-Operate machinery °-Drink alcoholic beverages °-Take any medication unless instructed by your physician °-Make any legal decisions or sign important papers. ° °Meals: °Start with liquid foods such as gelatin or soup. Progress to regular foods as tolerated. Avoid greasy, spicy, heavy foods. If nausea and/or vomiting occur, drink only clear liquids until the nausea and/or vomiting subsides. Call your physician if vomiting continues. ° °Special Instructions/Symptoms: °Your throat may feel dry or sore from the anesthesia or the breathing tube placed in your throat during surgery. If this causes discomfort, gargle with warm salt water. The discomfort should disappear within 24 hours. ° ° °

## 2011-08-21 NOTE — Anesthesia Preprocedure Evaluation (Addendum)
Anesthesia Evaluation  Patient identified by MRN, date of birth, ID band Patient awake    Reviewed: Allergy & Precautions, H&P , NPO status , Patient's Chart, lab work & pertinent test results  Airway Mallampati: II TM Distance: >3 FB Neck ROM: Full    Dental No notable dental hx. (+) Teeth Intact and Dental Advisory Given   Pulmonary neg pulmonary ROS,  breath sounds clear to auscultation  Pulmonary exam normal       Cardiovascular hypertension, Rhythm:Regular Rate:Normal     Neuro/Psych  Headaches, negative psych ROS   GI/Hepatic negative GI ROS, Neg liver ROS,   Endo/Other  negative endocrine ROS  Renal/GU negative Renal ROS  negative genitourinary   Musculoskeletal   Abdominal   Peds  Hematology negative hematology ROS (+)   Anesthesia Other Findings   Reproductive/Obstetrics negative OB ROS                          Anesthesia Physical Anesthesia Plan  ASA: II  Anesthesia Plan: General   Post-op Pain Management:    Induction: Intravenous  Airway Management Planned: LMA  Additional Equipment:   Intra-op Plan:   Post-operative Plan: Extubation in OR  Informed Consent: I have reviewed the patients History and Physical, chart, labs and discussed the procedure including the risks, benefits and alternatives for the proposed anesthesia with the patient or authorized representative who has indicated his/her understanding and acceptance.   Dental advisory given  Plan Discussed with: CRNA  Anesthesia Plan Comments:         Anesthesia Quick Evaluation

## 2011-08-21 NOTE — Anesthesia Procedure Notes (Addendum)
Procedure Name: LMA Insertion Date/Time: 08/21/2011 11:46 AM Performed by: Gar Gibbon Pre-anesthesia Checklist: Patient identified, Emergency Drugs available, Suction available and Patient being monitored Patient Re-evaluated:Patient Re-evaluated prior to inductionOxygen Delivery Method: Circle System Utilized Preoxygenation: Pre-oxygenation with 100% oxygen Intubation Type: IV induction Ventilation: Mask ventilation without difficulty LMA: LMA inserted LMA Size: 4.0 Number of attempts: 1 Airway Equipment and Method: bite block Placement Confirmation: positive ETCO2 Tube secured with: Tape Dental Injury: Teeth and Oropharynx as per pre-operative assessment    Procedure Name: Intubation Date/Time: 08/21/2011 12:09 PM Performed by: Gar Gibbon Pre-anesthesia Checklist: Patient identified, Emergency Drugs available, Suction available and Patient being monitored Patient Re-evaluated:Patient Re-evaluated prior to inductionOxygen Delivery Method: Circle System Utilized Preoxygenation: Pre-oxygenation with 100% oxygen Intubation Type: IV induction Ventilation: Mask ventilation without difficulty Laryngoscope Size: Mac and 3 Grade View: Grade II Tube type: Oral Tube size: 7.0 mm Number of attempts: 1 Airway Equipment and Method: stylet and oral airway Placement Confirmation: ETT inserted through vocal cords under direct vision,  positive ETCO2 and breath sounds checked- equal and bilateral Secured at: 22 cm Tube secured with: Tape Dental Injury: Teeth and Oropharynx as per pre-operative assessment

## 2011-08-21 NOTE — Transfer of Care (Signed)
Immediate Anesthesia Transfer of Care Note  Patient: Rachael Weaver  Procedure(s) Performed: Procedure(s) (LRB): BREAST LUMPECTOMY WITH NEEDLE LOCALIZATION AND AXILLARY SENTINEL LYMPH NODE BX (Right) INSERTION PORT-A-CATH (Left)  Patient Location: PACU  Anesthesia Type: General  Level of Consciousness: sedated  Airway & Oxygen Therapy: Patient Spontanous Breathing and Patient connected to face mask oxygen  Post-op Assessment: Report given to PACU RN and Post -op Vital signs reviewed and stable  Post vital signs: Reviewed and stable  Complications: No apparent anesthesia complications

## 2011-08-22 ENCOUNTER — Encounter (HOSPITAL_BASED_OUTPATIENT_CLINIC_OR_DEPARTMENT_OTHER): Payer: Self-pay | Admitting: Surgery

## 2011-08-25 ENCOUNTER — Other Ambulatory Visit: Payer: Self-pay | Admitting: *Deleted

## 2011-08-25 DIAGNOSIS — C50419 Malignant neoplasm of upper-outer quadrant of unspecified female breast: Secondary | ICD-10-CM

## 2011-08-25 LAB — POCT HEMOGLOBIN-HEMACUE: Hemoglobin: 12.4 g/dL (ref 12.0–15.0)

## 2011-08-26 ENCOUNTER — Other Ambulatory Visit (HOSPITAL_BASED_OUTPATIENT_CLINIC_OR_DEPARTMENT_OTHER): Payer: Self-pay | Admitting: Lab

## 2011-08-26 ENCOUNTER — Ambulatory Visit (HOSPITAL_BASED_OUTPATIENT_CLINIC_OR_DEPARTMENT_OTHER): Payer: Medicaid Other | Admitting: Oncology

## 2011-08-26 ENCOUNTER — Encounter (INDEPENDENT_AMBULATORY_CARE_PROVIDER_SITE_OTHER): Payer: Self-pay | Admitting: Surgery

## 2011-08-26 ENCOUNTER — Ambulatory Visit (INDEPENDENT_AMBULATORY_CARE_PROVIDER_SITE_OTHER): Payer: PRIVATE HEALTH INSURANCE | Admitting: Surgery

## 2011-08-26 VITALS — BP 148/88 | HR 88 | Temp 98.4°F | Ht 63.0 in | Wt 165.7 lb

## 2011-08-26 VITALS — BP 132/76 | HR 76 | Resp 16 | Ht 64.0 in | Wt 164.0 lb

## 2011-08-26 DIAGNOSIS — C50419 Malignant neoplasm of upper-outer quadrant of unspecified female breast: Secondary | ICD-10-CM

## 2011-08-26 DIAGNOSIS — C50919 Malignant neoplasm of unspecified site of unspecified female breast: Secondary | ICD-10-CM | POA: Insufficient documentation

## 2011-08-26 DIAGNOSIS — E559 Vitamin D deficiency, unspecified: Secondary | ICD-10-CM

## 2011-08-26 DIAGNOSIS — Z9889 Other specified postprocedural states: Secondary | ICD-10-CM

## 2011-08-26 DIAGNOSIS — Z170 Estrogen receptor positive status [ER+]: Secondary | ICD-10-CM

## 2011-08-26 LAB — CBC WITH DIFFERENTIAL/PLATELET
BASO%: 0.4 % (ref 0.0–2.0)
HCT: 42 % (ref 34.8–46.6)
HGB: 14.3 g/dL (ref 11.6–15.9)
MCHC: 34 g/dL (ref 31.5–36.0)
MONO#: 0.4 10*3/uL (ref 0.1–0.9)
NEUT#: 3.7 10*3/uL (ref 1.5–6.5)
NEUT%: 55.7 % (ref 38.4–76.8)
WBC: 6.7 10*3/uL (ref 3.9–10.3)
lymph#: 2.4 10*3/uL (ref 0.9–3.3)

## 2011-08-26 MED ORDER — LORAZEPAM 0.5 MG PO TABS: 0.5000 mg | ORAL_TABLET | Freq: Four times a day (QID) | ORAL | Status: DC | PRN

## 2011-08-26 MED ORDER — PROCHLORPERAZINE MALEATE 10 MG PO TABS: 10.0000 mg | ORAL_TABLET | Freq: Four times a day (QID) | ORAL | Status: DC | PRN

## 2011-08-26 MED ORDER — PROCHLORPERAZINE 25 MG RE SUPP: 25.0000 mg | Freq: Two times a day (BID) | RECTAL | Status: DC | PRN

## 2011-08-26 MED ORDER — LIDOCAINE-PRILOCAINE 2.5-2.5 % EX CREA: TOPICAL_CREAM | Freq: Once | CUTANEOUS | Status: DC

## 2011-08-26 MED ORDER — ACYCLOVIR 400 MG PO TABS: 400.0000 mg | ORAL_TABLET | Freq: Two times a day (BID) | ORAL | Status: AC

## 2011-08-26 MED ORDER — ONDANSETRON HCL 8 MG PO TABS: ORAL_TABLET | ORAL | Status: DC

## 2011-08-26 MED ORDER — DEXAMETHASONE 4 MG PO TABS: ORAL_TABLET | ORAL | Status: DC

## 2011-08-26 NOTE — Progress Notes (Signed)
Rachael Weaver    [id number] 08/26/2011    1950-08-28   CC: Post op lumpectomy, sln and PAC  HPI: The patient returns for post op follow-up. She underwent a right lumpectomy, SLN and PAC placement on 08/21/2011. Over all she feels that she is doing well. She has taken only Tylenol or advil for pain  PE: The incision is healing nicely and there is no evidence of infection or hematoma.   DATA REVIEWED: Pathology report showed 3.1 cm IDC, negative margins and 0/3 involved LN's  IMPRESSION: Patient doing well.   PLAN: Her next visit will be in two months. She starts chemo soon.

## 2011-08-26 NOTE — Progress Notes (Signed)
Hematology and Oncology Follow Up Visit  Rachael Weaver [id number] 02-Apr-1951 61 y.o. 08/26/2011 11:56 AM   DIAGNOSIS: ER/PR positive breast cancer, HER-2 positive  Encounter Diagnoses  Name Primary?  . Cancer of upper-outer quadrant of female breast Yes  . Unspecified vitamin D deficiency   . Breast cancer      PAST THERAPY: Lumpectomy sentinel lymph node evaluation, 08/21/2011    Interim History:  Returns for followup. She had surgery about a week ago. Final pathology showed a 3.1 cm lymph node negative breast cancer. Of the original pathology showed this to be ER PR positive, HER-2 positive with a ratio of 2.88 with a high proliferative index. 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 Her staging evaluation was essentially negative, bone scan negative, CT scan showing a tiny oh lung nodule measuring 5 mm without evidence of metastatic disease. A 2-D echo done was normal ejection fraction of 60%.  Medications: I have reviewed the patient's current medications.  Allergies: No Known Allergies  Past Medical History, Surgical history, Social history, and Family History were reviewed and updated.  Review of Systems: Constitutional:  Negative for fever, chills, night sweats, anorexia, weight loss, pain. Cardiovascular: negative Respiratory: negative Neurological: negative Dermatological: negative ENT: negative Skin Gastrointestinal: negative Genito-Urinary: negative Hematological and Lymphatic: negative Breast: negative Musculoskeletal: negative Remaining ROS negative.  Physical Exam:  Blood pressure 148/88, pulse 88, temperature 98.4 F (36.9 C), height 5\' 3"  (1.6 m), weight 165 lb 11.2 oz (75.161 kg).  ECOG: 0   General appearance: alert, cooperative and appears stated age Resp: clear to auscultation bilaterally and normal percussion bilaterally Breasts: normal appearance, no masses or tenderness GI: soft, non-tender; bowel sounds normal; no masses,  no  organomegaly Skin: Skin color, texture, turgor normal. No rashes or lesions Lymph nodes: Cervical, supraclavicular, and axillary nodes normal.   Lab Results: Lab Results  Component Value Date   WBC 6.7 08/26/2011   HGB 14.3 08/26/2011   HCT 42.0 08/26/2011   MCV 90.0 08/26/2011   PLT 239 08/26/2011     Chemistry      Component Value Date/Time   NA 138 07/30/2011 0812   K 3.9 07/30/2011 0812   CL 102 07/30/2011 0812   CO2 27 07/30/2011 0812   BUN 16 07/30/2011 0812   CREATININE 0.80 07/30/2011 0812      Component Value Date/Time   CALCIUM 9.7 07/30/2011 0812   ALKPHOS 98 07/30/2011 0812   AST 21 07/30/2011 0812   ALT 15 07/30/2011 0812   BILITOT 0.6 07/30/2011 4098       Radiological Studies:  Ct Chest W Contrast  08/12/2011  *RADIOLOGY REPORT*  Clinical Data:  Staging breast cancer.  Chemotherapy radiation therapy planning. Right breast cancer.  CT CHEST, ABDOMEN AND PELVIS WITH CONTRAST  Technique:  Multidetector CT imaging of the chest, abdomen and pelvis was performed following the standard protocol during bolus administration of intravenous contrast.  Contrast: OMNIPAQUE IOHEXOL 300 MG/ML  SOLN  Comparison:   None.  CT CHEST  Findings:  There is an irregular 2.2 x 2.3 mass in the right breast with a central biopsy clip (image 15).  No pathologically enlarged right axillary lymph node.  Prominent axillary node measures 7 mm short axis (image 17).  No sub pectoralis adenopathy.  No supraclavicular or infraclavicular lymphadenopathy.  No enlarged internal mammary lymph nodes.  There is a high right paratracheal lymph node which measures 5 mm short axis (image 14).  This is not pathologic by  CT size criteria.  No mediastinal lymphadenopathy in the left axillary adenopathy.  The review of the lung parenchyma demonstrates a 5 mm nodule in the right upper lobe (image 29).  IMPRESSION:  1.  Small axillary and  mediastinal lymph nodes are not pathologic by CT size criteria. 2.  Right breast mass with  biopsy clip. 3.  Small 5 mm right lobe pulmonary nodule is likely benign. Recommend attention on follow-up.  CT ABDOMEN AND PELVIS  Findings:  There is a hypodense lesion within the right hepatic lobe measures 4 mm (image 54) and is too small to characterize. There is focal fatty infiltration along the falciform ligament. The gallbladder, pancreas, spleen, and kidneys are normal.  The adrenal glands are mildly thickened which likely represents hyperplasia.  The stomach, small bowel, and cecum are normal.  The colon and rectosigmoid colon normal.  No retroperitoneal or periportal lymphadenopathy.  There are mildly enlarged central mesenteric lymph nodes.  For example 7 mm short axis node along the SMA (image 78).  Small lymph nodes in the ileocecal mesentery additionally (image 86).  No peritoneal implants present.  No free fluid the pelvis.  Uterus and ovaries are normal.  No pelvic lymphadenopathy.  Review of  bone windows demonstrates no aggressive osseous lesions.  IMPRESSION:  1.  No evidence metastasis in the abdomen pelvis. 2.  Small hypodense liver is too small to characterize but likely benign. 3.  Prominent mesenteric lymph nodes likely inflammatory.  Unlikely pattern for breast cancer nodal metastasis.  Original Report Authenticated By: Genevive Bi, M.D.   Nm Bone Scan Whole Body  08/12/2011  *RADIOLOGY REPORT*  Clinical Data: Right breast cancer and right shoulder blade pain.  NUCLEAR MEDICINE WHOLE BODY BONE SCINTIGRAPHY  Technique:  Whole body anterior and posterior images were obtained approximately 3 hours after intravenous injection of radiopharmaceutical.  Radiopharmaceutical: 25.4MILLI CURIE TC-MDP TECHNETIUM TC 28M MEDRONATE IV KIT  Comparison: CT of the chest abdomen pelvis dated 08/12/2011  Findings: Expected uptake within the urinary bladder.  Patchy areas of uptake involving both knees and both feet.  No abnormal uptake in the right shoulder blade region.  No abnormal uptake involving  the axial skeleton. There is a small focus of increased uptake involving the posterior left greater trochanter.  This uptake corresponds with a small area of sclerosis and lucency on the recent CT but this finding is nonspecific.  Small amount of uptake in the left cervical spine is likely degenerative in etiology.  IMPRESSION: No evidence for osseous metastatic disease.  Increased uptake involving both feet and knees.  These findings are likely degenerative in etiology.  Small focus of increased uptake in the posterior left femoral trochanter.  There is a small area of irregularity on the CT images but these findings are nonspecific.  Recommend attention to this area on followup imaging.  Original Report Authenticated By: Richarda Overlie, M.D.   US Breast Right  08/01/2011  *RADIOLOGY REPORT*  Clinical Data:  Right-sided breast cancer.  Lymphadenopathy by MRI.  RIGHT BREAST ULTRASOUND  Comparison:  None.  On physical exam, I palpate normal tissue in the right axilla.  Findings: Ultrasound is performed, showing two adjacent lymph nodes in the right axilla, both of which have a thin cortex and a fatty hilum.  Therefore, no biopsy is done.  IMPRESSION: Normal right axillary ultrasound.  BI-RADS CATEGORY 6:  Known biopsy-proven malignancy - appropriate action should be taken.  Original Report Authenticated By: Hiram Gash, M.D.   Ct  Abdomen Pelvis W Contrast  08/12/2011  *RADIOLOGY REPORT*  Clinical Data:  Staging breast cancer.  Chemotherapy radiation therapy planning. Right breast cancer.  CT CHEST, ABDOMEN AND PELVIS WITH CONTRAST  Technique:  Multidetector CT imaging of the chest, abdomen and pelvis was performed following the standard protocol during bolus administration of intravenous contrast.  Contrast: OMNIPAQUE IOHEXOL 300 MG/ML  SOLN  Comparison:   None.  CT CHEST  Findings:  There is an irregular 2.2 x 2.3 mass in the right breast with a central biopsy clip (image 15).  No pathologically  enlarged right axillary lymph node.  Prominent axillary node measures 7 mm short axis (image 17).  No sub pectoralis adenopathy.  No supraclavicular or infraclavicular lymphadenopathy.  No enlarged internal mammary lymph nodes.  There is a high right paratracheal lymph node which measures 5 mm short axis (image 14).  This is not pathologic by CT size criteria.  No mediastinal lymphadenopathy in the left axillary adenopathy.  The review of the lung parenchyma demonstrates a 5 mm nodule in the right upper lobe (image 29).  IMPRESSION:  1.  Small axillary and  mediastinal lymph nodes are not pathologic by CT size criteria. 2.  Right breast mass with biopsy clip. 3.  Small 5 mm right lobe pulmonary nodule is likely benign. Recommend attention on follow-up.  CT ABDOMEN AND PELVIS  Findings:  There is a hypodense lesion within the right hepatic lobe measures 4 mm (image 54) and is too small to characterize. There is focal fatty infiltration along the falciform ligament. The gallbladder, pancreas, spleen, and kidneys are normal.  The adrenal glands are mildly thickened which likely represents hyperplasia.  The stomach, small bowel, and cecum are normal.  The colon and rectosigmoid colon normal.  No retroperitoneal or periportal lymphadenopathy.  There are mildly enlarged central mesenteric lymph nodes.  For example 7 mm short axis node along the SMA (image 78).  Small lymph nodes in the ileocecal mesentery additionally (image 86).  No peritoneal implants present.  No free fluid the pelvis.  Uterus and ovaries are normal.  No pelvic lymphadenopathy.  Review of  bone windows demonstrates no aggressive osseous lesions.  IMPRESSION:  1.  No evidence metastasis in the abdomen pelvis. 2.  Small hypodense liver is too small to characterize but likely benign. 3.  Prominent mesenteric lymph nodes likely inflammatory.  Unlikely pattern for breast cancer nodal metastasis.  Original Report Authenticated By: Genevive Bi, M.D.    Nm Sentinel Node Inj-no Rpt (breast)  08/21/2011  CLINICAL DATA: right breast cancer   Sulfur colloid was injected intradermally by the nuclear medicine  technologist for breast cancer sentinel node localization.     Dg Chest Port 1 View  08/21/2011  *RADIOLOGY REPORT*  Clinical Data: Status post Port-A-Cath placement.  PORTABLE CHEST - 1 VIEW  Comparison: CT chest 08/12/2011.  Findings: New left subclavian Port-A-Cath is in place.  Tip is in the lower superior vena cava.  No pneumothorax.  Lungs are clear. Heart size is normal.  IMPRESSION: Port-A-Cath in good position.  No pneumothorax or other acute finding.  Original Report Authenticated By: Bernadene Bell. Maricela Curet, M.D.   Mm Breast Surgical Specimen  08/21/2011  *RADIOLOGY REPORT*  Clinical Data:  Cancer involving the right breast.  The patient presents for needle localization prior to lumpectomy.  NEEDLE LOCALIZATION WITH MAMMOGRAPHIC GUIDANCE AND SPECIMEN RADIOGRAPH  Patient presents for needle localization prior to lumpectomy.  I met with the patient and we discussed the  procedure of needle localization including benefits and alternatives. We discussed the high likelihood of a successful procedure. We discussed the risks of the procedure, including infection, bleeding, tissue injury, and further surgery. Informed, written consent was given.  Using mammographic guidance, sterile technique, 2% lidocaine and a modified Kopans needle, mass and clip are localized using a cephalad approach.  The films are marked for Dr. Jamey Ripa.  [facility], and confirms mass, clip, and wire to be present in the tissue sample. The specimen is marked for pathology.  IMPRESSION: Needle localization of the right breast.  No apparent complications.  Original Report Authenticated By: Patterson Hammersmith, M.D.   Mm Breast Wire Localization Right  08/21/2011  *RADIOLOGY REPORT*  Clinical Data:  Cancer involving the right breast.   The patient presents for needle localization prior to lumpectomy.  NEEDLE LOCALIZATION WITH MAMMOGRAPHIC GUIDANCE AND SPECIMEN RADIOGRAPH  Patient presents for needle localization prior to lumpectomy.  I met with the patient and we discussed the procedure of needle localization including benefits and alternatives. We discussed the high likelihood of a successful procedure. We discussed the risks of the procedure, including infection, bleeding, tissue injury, and further surgery. Informed, written consent was given.  Using mammographic guidance, sterile technique, 2% lidocaine and a modified Kopans needle, mass and clip are localized using a cephalad approach.  The films are marked for Dr. Jamey Ripa.  Specimen radiograph [facility], and confirms mass, clip, and wire to be present in the tissue sample. The specimen is marked for pathology.  IMPRESSION: Needle localization of the right breast.  No apparent complications.  Original Report Authenticated By: Patterson Hammersmith, M.D.   Dg Fluoro Guide Cv Line-no Report  08/21/2011  CLINICAL DATA: portacath insertion   FLOURO GUIDE CV LINE  Fluoroscopy was utilized by the requesting physician.  No radiographic  interpretation.       IMPRESSIONS AND PLAN: A 61 y.o. female with   History of HER-2 positive, ER/PR positive breast cancer. We spent 30 minutes today discussing adjuvant treatment with [facility]. I have sent prescriptions with appropriate premedications as well as EMLA cream. Her port is in place. Anticipate starting treatment on May 9. She will receive Neulasta on May 10 and be seen in followup a week later. I am optimistic that she will do well, tolerate treatment well and did have a excellent prognosis. Of note is a genetic testing was  Not performed as should this was not covered by her insurance. 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  Spent more than half the time coordinating care.    Mariyah Upshaw 4/30/201311:56 AM

## 2011-08-26 NOTE — Patient Instructions (Signed)
See me again in about two months 

## 2011-08-27 ENCOUNTER — Telehealth: Payer: Self-pay | Admitting: *Deleted

## 2011-08-27 LAB — COMPREHENSIVE METABOLIC PANEL
Alkaline Phosphatase: 90 U/L (ref 39–117)
BUN: 18 mg/dL (ref 6–23)
CO2: 23 mEq/L (ref 19–32)
Creatinine, Ser: 0.8 mg/dL (ref 0.50–1.10)
Glucose, Bld: 145 mg/dL — ABNORMAL HIGH (ref 70–99)
Sodium: 139 mEq/L (ref 135–145)
Total Bilirubin: 0.5 mg/dL (ref 0.3–1.2)

## 2011-08-27 LAB — VITAMIN D 25 HYDROXY (VIT D DEFICIENCY, FRACTURES): Vit D, 25-Hydroxy: 43 ng/mL (ref 30–89)

## 2011-08-27 NOTE — Telephone Encounter (Signed)
left voice message to inform the patient of the add on time for 09-11-2011 starting at 08:45 will give patient printed out calendar on 09-04-2011

## 2011-08-29 ENCOUNTER — Telehealth (INDEPENDENT_AMBULATORY_CARE_PROVIDER_SITE_OTHER): Payer: Self-pay

## 2011-08-29 NOTE — Telephone Encounter (Signed)
The pt called complaining of a rash on the left side of her body that the port is on and it starts from the breast to the armpit.  Her neck is now also itching.  She has no fever.  It is not happening on the right side that the lumpectomy was done on.  She starts chemo next Thursday.  I spoke to Dr Luisa Hart and he thinks it was from the cleanser/skin prep that was used in surgery.  He recommends Hydrocortisone cream for one to two days.  She can start taking Benadryl 25mg  q 6hrs which will make her drowsy so don't drive.  I notified the pt and asked her to let us know early next week if no better.

## 2011-09-01 ENCOUNTER — Other Ambulatory Visit: Payer: Self-pay | Admitting: *Deleted

## 2011-09-01 NOTE — Telephone Encounter (Signed)
Emla cream called into [facility] pharmacy per pt request. MD e-scribed all premeds to [facility] on [address], however Emla cream cannot be escribed

## 2011-09-04 ENCOUNTER — Ambulatory Visit (HOSPITAL_BASED_OUTPATIENT_CLINIC_OR_DEPARTMENT_OTHER): Payer: Medicaid Other

## 2011-09-04 ENCOUNTER — Ambulatory Visit: Payer: Medicaid Other

## 2011-09-04 VITALS — BP 134/83 | HR 98 | Temp 98.4°F

## 2011-09-04 DIAGNOSIS — C50419 Malignant neoplasm of upper-outer quadrant of unspecified female breast: Secondary | ICD-10-CM

## 2011-09-04 DIAGNOSIS — C50919 Malignant neoplasm of unspecified site of unspecified female breast: Secondary | ICD-10-CM

## 2011-09-04 DIAGNOSIS — Z5111 Encounter for antineoplastic chemotherapy: Secondary | ICD-10-CM

## 2011-09-04 MED ORDER — HEPARIN SOD (PORK) LOCK FLUSH 100 UNIT/ML IV SOLN
500.0000 [IU] | Freq: Once | INTRAVENOUS | Status: DC | PRN
  Filled 2011-09-04: qty 5

## 2011-09-04 MED ORDER — SODIUM CHLORIDE 0.9 % IV SOLN: Freq: Once | INTRAVENOUS | Status: DC

## 2011-09-04 MED ORDER — SODIUM CHLORIDE 0.9 % IV SOLN
Freq: Once | INTRAVENOUS | Status: AC
  Administered 2011-09-04: 12:00:00 via INTRAVENOUS

## 2011-09-04 MED ORDER — SODIUM CHLORIDE 0.9 % IV SOLN
650.0000 mg | Freq: Once | INTRAVENOUS | Status: AC
  Administered 2011-09-04: 650 mg via INTRAVENOUS
  Filled 2011-09-04: qty 65

## 2011-09-04 MED ORDER — SODIUM CHLORIDE 0.9 % IJ SOLN
10.0000 mL | INTRAMUSCULAR | Status: DC | PRN
  Filled 2011-09-04: qty 10

## 2011-09-04 MED ORDER — TRASTUZUMAB CHEMO INJECTION 440 MG
4.0000 mg/kg | Freq: Once | INTRAVENOUS | Status: AC
  Administered 2011-09-04: 294 mg via INTRAVENOUS
  Filled 2011-09-04: qty 14

## 2011-09-04 MED ORDER — DIPHENHYDRAMINE HCL 25 MG PO CAPS
50.0000 mg | ORAL_CAPSULE | Freq: Once | ORAL | Status: AC
  Administered 2011-09-04: 50 mg via ORAL

## 2011-09-04 MED ORDER — DEXAMETHASONE SODIUM PHOSPHATE 4 MG/ML IJ SOLN
20.0000 mg | Freq: Once | INTRAMUSCULAR | Status: AC
  Administered 2011-09-04: 20 mg via INTRAVENOUS

## 2011-09-04 MED ORDER — ONDANSETRON 16 MG/50ML IVPB (CHCC)
16.0000 mg | Freq: Once | INTRAVENOUS | Status: AC
  Administered 2011-09-04: 16 mg via INTRAVENOUS

## 2011-09-04 MED ORDER — ACETAMINOPHEN 325 MG PO TABS
650.0000 mg | ORAL_TABLET | Freq: Once | ORAL | Status: AC
  Administered 2011-09-04: 650 mg via ORAL

## 2011-09-04 MED ORDER — DOCETAXEL CHEMO INJECTION 160 MG/16ML
75.0000 mg/m2 | Freq: Once | INTRAVENOUS | Status: AC
  Administered 2011-09-04: 140 mg via INTRAVENOUS
  Filled 2011-09-04: qty 14

## 2011-09-04 NOTE — Patient Instructions (Signed)
[facility] Discharge Instructions for Patients Receiving Chemotherapy  Today you received the following chemotherapy agents taxotere, carboplatin, herceptin  To help prevent nausea and vomiting after your treatment, we encourage you to take your nausea medication * and take it as often as prescribed   If you develop nausea and vomiting that is not controlled by your nausea medication, call the clinic. If it is after clinic hours your family physician or the after hours number for the clinic or go to the Emergency Department.   BELOW ARE SYMPTOMS THAT SHOULD BE REPORTED IMMEDIATELY:  *FEVER GREATER THAN 100.5 F  *CHILLS WITH OR WITHOUT FEVER  NAUSEA AND VOMITING THAT IS NOT CONTROLLED WITH YOUR NAUSEA MEDICATION  *UNUSUAL SHORTNESS OF BREATH  *UNUSUAL BRUISING OR BLEEDING  TENDERNESS IN MOUTH AND THROAT WITH OR WITHOUT PRESENCE OF ULCERS  *URINARY PROBLEMS  *BOWEL PROBLEMS  UNUSUAL RASH Items with * indicate a potential emergency and should be followed up as soon as possible.  One of the nurses will contact you 24 hours after your treatment. Please let the nurse know about any problems that you may have experienced. Feel free to call the clinic you have any questions or concerns. The clinic phone number is [phone].   I have been informed and understand all the instructions given to me. I know to contact the clinic, my physician, or go to the Emergency Department if any problems should occur. I do not have any questions at this time, but understand that I may call the clinic during office hours   should I have any questions or need assistance in obtaining follow up care.    __________________________________________  _____________  __________ Signature of Patient or Authorized Representative            Date                   Time    __________________________________________ Nurse's Signature

## 2011-09-05 ENCOUNTER — Ambulatory Visit: Payer: Medicaid Other | Admitting: Nutrition

## 2011-09-05 ENCOUNTER — Ambulatory Visit (HOSPITAL_BASED_OUTPATIENT_CLINIC_OR_DEPARTMENT_OTHER): Payer: Medicaid Other

## 2011-09-05 VITALS — BP 120/70 | HR 91 | Temp 98.1°F

## 2011-09-05 DIAGNOSIS — C50919 Malignant neoplasm of unspecified site of unspecified female breast: Secondary | ICD-10-CM

## 2011-09-05 DIAGNOSIS — C50419 Malignant neoplasm of upper-outer quadrant of unspecified female breast: Secondary | ICD-10-CM

## 2011-09-05 MED ORDER — PEGFILGRASTIM INJECTION 6 MG/0.6ML
6.0000 mg | Freq: Once | SUBCUTANEOUS | Status: AC
  Administered 2011-09-05: 6 mg via SUBCUTANEOUS
  Filled 2011-09-05: qty 0.6

## 2011-09-05 NOTE — Assessment & Plan Note (Signed)
MEDICAL DIAGNOSIS:  Rachael Weaver is a 61 year old female patient of Dr. Donnie Coffin diagnosed with ER PR positive breast cancer, status post lumpectomy and her 1st chemotherapy treatment.  MEDICAL HISTORY INCLUDES:  Vitamin D deficiency.  MEDICATIONS INCLUDE:  Decadron, Ativan, Zofran and Compazine.  LABS:  Glucose of 135, and vitamin D of 43.  HEIGHT:  64 inches. WEIGHT:  164 pounds. USUAL BODY WEIGHT:  About 160 pounds. BMI:  28.14.  REASON FOR ASSESSMENT:  Patient is interested in nutrition information during chemotherapy.  She reports recent weight gain since diagnosis. She does have a history of obesity, for which she had managed to lose quite a bit weight by incorporating a healthier diet and pretty strenuous exercise regimen.  She has been unable to exercise during the last month or so secondary to her diagnosis. However, she has been given clearance by her physician to return to her normal activity level.  NUTRITION DIAGNOSIS:  Food and nutrition related knowledge deficit related to diagnosis of breast cancer and associated treatments as evidenced by no prior need for nutrition related information.  INTERVENTION:  I have encouraged Rachael Weaver to follow a lower fat, plant based diet with plenty of protein and adequate calories to promote maintenance of lean body minimum and safe weight loss.  I have reviewed recommendations for breast cancer patients to include topics on organic foods, soy consumption, antioxidants and plant based diet.  I have also reviewed exercise recommendations with the patient and provided her with fact sheets and my contact information and I have answered her questions.  MONITORING/EVALUATION (GOALS):  The patient will tolerate a healthy plant based diet to preserve lean body mass and promote slow safe weight loss.  NEXT VISIT:  Patient will call with questions or concerns.    ______________________________ Zenovia Jarred, RD, LDN Clinical Nutrition Specialist BN/MEDQ   D:  09/05/2011  T:  09/05/2011  Job:  [id number]

## 2011-09-11 ENCOUNTER — Telehealth: Payer: Self-pay | Admitting: Oncology

## 2011-09-11 ENCOUNTER — Ambulatory Visit (HOSPITAL_BASED_OUTPATIENT_CLINIC_OR_DEPARTMENT_OTHER): Payer: Medicaid Other

## 2011-09-11 ENCOUNTER — Other Ambulatory Visit (HOSPITAL_BASED_OUTPATIENT_CLINIC_OR_DEPARTMENT_OTHER): Payer: Medicaid Other | Admitting: Lab

## 2011-09-11 ENCOUNTER — Ambulatory Visit (HOSPITAL_BASED_OUTPATIENT_CLINIC_OR_DEPARTMENT_OTHER): Payer: Medicaid Other | Admitting: Physician Assistant

## 2011-09-11 VITALS — BP 122/79 | HR 106 | Temp 99.0°F | Ht 64.0 in | Wt 163.9 lb

## 2011-09-11 DIAGNOSIS — C50919 Malignant neoplasm of unspecified site of unspecified female breast: Secondary | ICD-10-CM

## 2011-09-11 DIAGNOSIS — C50419 Malignant neoplasm of upper-outer quadrant of unspecified female breast: Secondary | ICD-10-CM

## 2011-09-11 DIAGNOSIS — Z5112 Encounter for antineoplastic immunotherapy: Secondary | ICD-10-CM

## 2011-09-11 LAB — CBC WITH DIFFERENTIAL/PLATELET
Eosinophils Absolute: 0.1 10*3/uL (ref 0.0–0.5)
HCT: 40.6 % (ref 34.8–46.6)
LYMPH%: 20.2 % (ref 14.0–49.7)
MCV: 90.8 fL (ref 79.5–101.0)
MONO#: 1.9 10*3/uL — ABNORMAL HIGH (ref 0.1–0.9)
MONO%: 10.1 % (ref 0.0–14.0)
NEUT#: 12.7 10*3/uL — ABNORMAL HIGH (ref 1.5–6.5)
NEUT%: 69.1 % (ref 38.4–76.8)
Platelets: 180 10*3/uL (ref 145–400)
RBC: 4.47 10*6/uL (ref 3.70–5.45)
nRBC: 0 % (ref 0–0)

## 2011-09-11 LAB — COMPREHENSIVE METABOLIC PANEL
ALT: 17 U/L (ref 0–35)
BUN: 19 mg/dL (ref 6–23)
CO2: 24 mEq/L (ref 19–32)
Calcium: 8.4 mg/dL (ref 8.4–10.5)
Chloride: 103 mEq/L (ref 96–112)
Creatinine, Ser: 1.04 mg/dL (ref 0.50–1.10)
Glucose, Bld: 118 mg/dL — ABNORMAL HIGH (ref 70–99)
Total Bilirubin: 0.4 mg/dL (ref 0.3–1.2)

## 2011-09-11 MED ORDER — ACETAMINOPHEN 325 MG PO TABS
650.0000 mg | ORAL_TABLET | Freq: Once | ORAL | Status: AC
  Administered 2011-09-11: 650 mg via ORAL

## 2011-09-11 MED ORDER — DIPHENHYDRAMINE HCL 50 MG/ML IJ SOLN
25.0000 mg | Freq: Once | INTRAMUSCULAR | Status: AC
  Administered 2011-09-11: 25 mg via INTRAVENOUS

## 2011-09-11 MED ORDER — SODIUM CHLORIDE 0.9 % IV SOLN
Freq: Once | INTRAVENOUS | Status: AC
  Administered 2011-09-11: 11:00:00 via INTRAVENOUS

## 2011-09-11 MED ORDER — TRASTUZUMAB CHEMO INJECTION 440 MG
2.0000 mg/kg | Freq: Once | INTRAVENOUS | Status: AC
  Administered 2011-09-11: 147 mg via INTRAVENOUS
  Filled 2011-09-11: qty 7

## 2011-09-11 MED ORDER — SODIUM CHLORIDE 0.9 % IJ SOLN
10.0000 mL | INTRAMUSCULAR | Status: DC | PRN
  Administered 2011-09-11: 10 mL
  Filled 2011-09-11: qty 10

## 2011-09-11 MED ORDER — HEPARIN SOD (PORK) LOCK FLUSH 100 UNIT/ML IV SOLN
500.0000 [IU] | Freq: Once | INTRAVENOUS | Status: AC | PRN
  Administered 2011-09-11: 500 [IU]
  Filled 2011-09-11: qty 5

## 2011-09-11 NOTE — Telephone Encounter (Signed)
gve the pt her may appt calendar. Pt is aware to pick up her June 2013 appt calendar when she comes in next week. Had michelle to add the chemo appts pt is aware of the process

## 2011-09-11 NOTE — Telephone Encounter (Signed)
gve the pt her June 2013 appt calendar 

## 2011-09-11 NOTE — Progress Notes (Signed)
Hematology and Oncology Follow Up Visit  Rachael Weaver [id number] March 02, 1951 61 y.o. 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 She is currently week 2 cycle 1 of 6 planned every 3 week TCH with weekly Herceptin. Neulasta is utilize for granulocyte support on day 2.  Interim History:   Rachael Weaver is seen today for followup after her first cycle of every 3 week TCH. She is due for Herceptin today. She actually feels quite well overall denying any unexplained fevers, chills, or night sweats. She did receive Neulasta on day 2, she denies a diffuse bony discomfort. No shortness of breath or chest pain. She has not been troubled with nausea, or emesis. She had a small amount of constipation which has resolved. She is staying well hydrated. Her only complaint is the noted mild "chemo brain". 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  A detailed review of systems is otherwise noncontributory as noted below.  Review of Systems: Constitutional:  no weight loss, fever, night sweats, feels well and fatigued Eyes: uses glasses ENT: no complaints Cardiovascular: no chest pain or dyspnea on exertion Respiratory: no cough, shortness of breath, or wheezing Neurological: no TIA or stroke symptoms Dermatological: negative Gastrointestinal: no abdominal pain, change in bowel habits, or black or bloody stools Genito-Urinary: no dysuria, trouble voiding, or hematuria Hematological and Lymphatic: negative Breast: negative Musculoskeletal: negative Remaining ROS negative.  Medications:   I have reviewed the patient's current medications.  Current Outpatient Prescriptions    Medication Sig Dispense Refill  . aspirin-acetaminophen-caffeine (EXCEDRIN MIGRAINE) 250-250-65 MG per tablet Take 2 tablets by mouth as needed.      Marland Kitchen dexamethasone (DECADRON) 4 MG tablet Take 2 tablets two times a day the day before Taxotere. Then take 2 tabs two times a day starting the day after chemo for 3 days.  30 tablet  1  . fexofenadine (ALLEGRA) 180 MG tablet Take 180 mg by mouth as needed.      Marland Kitchen LORazepam (ATIVAN) 0.5 MG tablet Take 1 tablet (0.5 mg total) by mouth every 6 (six) hours as needed (Nausea or vomiting).  30 tablet  0  . ondansetron (ZOFRAN) 8 MG tablet Take 1 tablet two times a day starting the day after chemo for 3 days. Then take 1 tab two times a day as needed for nausea or vomiting.  30 tablet  1  . prochlorperazine (COMPAZINE) 10 MG tablet Take 1 tablet (10 mg total) by mouth every 6 (six) hours as needed (Nausea or vomiting).  30 tablet  1  . prochlorperazine (COMPAZINE) 25 MG suppository Place 1 suppository (25 mg total) rectally every 12 (twelve) hours as needed for nausea.  12 suppository  3   No current facility-administered medications for this visit.   Facility-Administered Medications Ordered in Other Visits  Medication Dose Route Frequency Provider Last Rate Last Dose  . lidocaine-prilocaine (EMLA) cream   Topical Once Pierce Crane, MD        Allergies: No Known Allergies  Physical Exam: Filed Vitals:   09/11/11 0915  BP: 122/79  Pulse: 106  Temp: 99 F (37.2 C)    Body mass index is 28.13 kg/(m^2). Weight: 163 lbs. HEENT:  Sclerae anicteric, conjunctivae pink.  Oropharynx clear.  No mucositis or candidiasis.   Nodes:  No cervical, supraclavicular, or axillary lymphadenopathy palpated.  Breast Exam:  Deferred. Lungs:  Clear to auscultation bilaterally.  No crackles, rhonchi, or wheezes.   Heart:  Regular rate and rhythm.   Abdomen:  Soft, nontender.  Positive bowel sounds.  No organomegaly or masses palpated.   Musculoskeletal:  No focal  spinal tenderness to palpation.  Extremities:  Benign.  No peripheral edema or cyanosis.   Skin:  Benign.   Neuro:  Nonfocal, alert and oriented x 3.  Lab Results: Lab Results  Component Value Date   WBC 18.4* 09/11/2011   HGB 14.0 09/11/2011   HCT 40.6 09/11/2011   MCV 90.8 09/11/2011   PLT 180 09/11/2011   NEUTROABS 12.7* 09/11/2011     Chemistry      Component Value Date/Time   NA 139 08/26/2011 1007   K 4.1 08/26/2011 1007   CL 103 08/26/2011 1007   CO2 23 08/26/2011 1007   BUN 18 08/26/2011 1007   CREATININE 0.80 08/26/2011 1007      Component Value Date/Time   CALCIUM 9.0 08/26/2011 1007   ALKPHOS 90 08/26/2011 1007   AST 19 08/26/2011 1007   ALT 11 08/26/2011 1007   BILITOT 0.5 08/26/2011 1007      Lab Results  Component Value Date   LABCA2 33 07/30/2011    Assessment:  Ms. 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 She is currently week 2 cycle 1 of 6 planned every 3 week TCH with weekly Herceptin. Neulasta is utilize for granulocyte support on day 2.  Case to be reviewed with Dr. Donnie Coffin upon his return.  Plan:  Rachael Weaver will receive week 2 cycle 1 of every 3 week TCH today as scheduled which is single agent Herceptin. Her Benadryl has been decreased to 25 mg. She will return in one week's time for labs and week 3 cycle 1 which again is single agent Herceptin, to be seen by Dr. Donnie Coffin on 09/25/2011 correlating with week 1 cycle 2 of every 3 week TCH. This plan was reviewed with the patient, who voices understanding and agreement.  She knows to call with any changes or problems.    Abdulkarim Eberlin T, PA-C 09/11/2011

## 2011-09-16 ENCOUNTER — Telehealth: Payer: Self-pay

## 2011-09-16 ENCOUNTER — Ambulatory Visit (HOSPITAL_BASED_OUTPATIENT_CLINIC_OR_DEPARTMENT_OTHER): Payer: Medicaid Other | Admitting: Lab

## 2011-09-16 ENCOUNTER — Ambulatory Visit (HOSPITAL_BASED_OUTPATIENT_CLINIC_OR_DEPARTMENT_OTHER): Payer: Medicaid Other | Admitting: Oncology

## 2011-09-16 ENCOUNTER — Ambulatory Visit (HOSPITAL_COMMUNITY)
Admission: RE | Admit: 2011-09-16 | Discharge: 2011-09-16 | Disposition: A | Discharge status: Home / Self Care | Payer: Medicaid Other | Source: Ambulatory Visit | Attending: Oncology | Admitting: Oncology

## 2011-09-16 VITALS — BP 139/75 | HR 114 | Temp 101.2°F | Ht 64.0 in | Wt 167.9 lb

## 2011-09-16 DIAGNOSIS — R509 Fever, unspecified: Secondary | ICD-10-CM | POA: Insufficient documentation

## 2011-09-16 DIAGNOSIS — C50419 Malignant neoplasm of upper-outer quadrant of unspecified female breast: Secondary | ICD-10-CM

## 2011-09-16 DIAGNOSIS — Z170 Estrogen receptor positive status [ER+]: Secondary | ICD-10-CM

## 2011-09-16 DIAGNOSIS — Z5111 Encounter for antineoplastic chemotherapy: Secondary | ICD-10-CM

## 2011-09-16 DIAGNOSIS — R05 Cough: Secondary | ICD-10-CM | POA: Insufficient documentation

## 2011-09-16 DIAGNOSIS — R059 Cough, unspecified: Secondary | ICD-10-CM | POA: Insufficient documentation

## 2011-09-16 DIAGNOSIS — J984 Other disorders of lung: Secondary | ICD-10-CM | POA: Insufficient documentation

## 2011-09-16 LAB — URINALYSIS, MICROSCOPIC - CHCC
Bilirubin (Urine): NEGATIVE
Glucose: NEGATIVE g/dL
Ketones: NEGATIVE mg/dL
Leukocyte Esterase: NEGATIVE
RBC count: NEGATIVE (ref 0–2)

## 2011-09-16 LAB — CBC WITH DIFFERENTIAL/PLATELET
BASO%: 0.3 % (ref 0.0–2.0)
Eosinophils Absolute: 0 10*3/uL (ref 0.0–0.5)
HCT: 37.8 % (ref 34.8–46.6)
LYMPH%: 7.7 % — ABNORMAL LOW (ref 14.0–49.7)
MCHC: 34.1 g/dL (ref 31.5–36.0)
MCV: 89.4 fL (ref 79.5–101.0)
MONO%: 4.2 % (ref 0.0–14.0)
NEUT%: 87.8 % — ABNORMAL HIGH (ref 38.4–76.8)
Platelets: 127 10*3/uL — ABNORMAL LOW (ref 145–400)
RBC: 4.23 10*6/uL (ref 3.70–5.45)
nRBC: 0 % (ref 0–0)

## 2011-09-16 MED ORDER — LEVOFLOXACIN 500 MG PO TABS: 500.0000 mg | ORAL_TABLET | Freq: Every day | ORAL | Status: DC

## 2011-09-16 NOTE — Telephone Encounter (Signed)
per orders from the triage nurse patient has an fever and the md ok the patient coming in

## 2011-09-16 NOTE — Telephone Encounter (Signed)
Received call from pt reporting she woke up feeling "spacey & out of it."  Pt did not improve with time, so took her temperature.  Pt found her temp to be 102.5.   Pt states "I didn't want to take anything or do anything until I talked to you."    Pt sounds hoarse on the phone and confirms that she is.  Also reports dry cough, but denies congestion.  Denies urinary sx.  Pt reports rt axillary lymph node bx site is tender and warm to touch; "maybe slightly red."   Pt received chemo on 5/9, and had nadar check on 5/16.    Per Dr Donnie Coffin, pt to come in for labs and to be evaluated by MD.  appt given to pt who confirms "I'll be there."  dph

## 2011-09-18 ENCOUNTER — Other Ambulatory Visit (HOSPITAL_BASED_OUTPATIENT_CLINIC_OR_DEPARTMENT_OTHER): Payer: Medicaid Other | Admitting: Lab

## 2011-09-18 ENCOUNTER — Ambulatory Visit (HOSPITAL_BASED_OUTPATIENT_CLINIC_OR_DEPARTMENT_OTHER): Payer: Medicaid Other

## 2011-09-18 VITALS — BP 118/74 | HR 103 | Temp 99.2°F

## 2011-09-18 DIAGNOSIS — C50419 Malignant neoplasm of upper-outer quadrant of unspecified female breast: Secondary | ICD-10-CM

## 2011-09-18 DIAGNOSIS — Z5112 Encounter for antineoplastic immunotherapy: Secondary | ICD-10-CM

## 2011-09-18 DIAGNOSIS — C50919 Malignant neoplasm of unspecified site of unspecified female breast: Secondary | ICD-10-CM

## 2011-09-18 DIAGNOSIS — Z170 Estrogen receptor positive status [ER+]: Secondary | ICD-10-CM

## 2011-09-18 LAB — CBC WITH DIFFERENTIAL/PLATELET
BASO%: 0.3 % (ref 0.0–2.0)
EOS%: 0.1 % (ref 0.0–7.0)
LYMPH%: 12.4 % — ABNORMAL LOW (ref 14.0–49.7)
MCH: 30.3 pg (ref 25.1–34.0)
MCHC: 33.5 g/dL (ref 31.5–36.0)
MCV: 90.3 fL (ref 79.5–101.0)
MONO%: 4.8 % (ref 0.0–14.0)
Platelets: 132 10*3/uL — ABNORMAL LOW (ref 145–400)
RBC: 3.9 10*6/uL (ref 3.70–5.45)
nRBC: 0 % (ref 0–0)

## 2011-09-18 MED ORDER — SODIUM CHLORIDE 0.9 % IJ SOLN
10.0000 mL | INTRAMUSCULAR | Status: DC | PRN
  Administered 2011-09-18: 10 mL
  Filled 2011-09-18: qty 10

## 2011-09-18 MED ORDER — ACETAMINOPHEN 325 MG PO TABS
650.0000 mg | ORAL_TABLET | Freq: Once | ORAL | Status: AC
  Administered 2011-09-18: 650 mg via ORAL

## 2011-09-18 MED ORDER — DIPHENHYDRAMINE HCL 50 MG/ML IJ SOLN
12.5000 mg | Freq: Once | INTRAMUSCULAR | Status: AC
  Administered 2011-09-18: 12.5 mg via INTRAVENOUS

## 2011-09-18 MED ORDER — SODIUM CHLORIDE 0.9 % IV SOLN
Freq: Once | INTRAVENOUS | Status: AC
  Administered 2011-09-18: 10:00:00 via INTRAVENOUS

## 2011-09-18 MED ORDER — TRASTUZUMAB CHEMO INJECTION 440 MG
2.0000 mg/kg | Freq: Once | INTRAVENOUS | Status: AC
  Administered 2011-09-18: 147 mg via INTRAVENOUS
  Filled 2011-09-18: qty 7

## 2011-09-18 MED ORDER — HEPARIN SOD (PORK) LOCK FLUSH 100 UNIT/ML IV SOLN
500.0000 [IU] | Freq: Once | INTRAVENOUS | Status: AC | PRN
  Administered 2011-09-18: 500 [IU]
  Filled 2011-09-18: qty 5

## 2011-09-18 NOTE — Patient Instructions (Signed)
[facility] Discharge Instructions for Patients Receiving Chemotherapy  Today you received the following chemotherapy agents Herceptin To help prevent nausea and vomiting after your treatment, we encourage you to take your nausea medication as prescribed.  If you develop nausea and vomiting that is not controlled by your nausea medication, call the clinic. If it is after clinic hours your family physician or the after hours number for the clinic or go to the Emergency Department.   BELOW ARE SYMPTOMS THAT SHOULD BE REPORTED IMMEDIATELY:  *FEVER GREATER THAN 100.5 F  *CHILLS WITH OR WITHOUT FEVER  NAUSEA AND VOMITING THAT IS NOT CONTROLLED WITH YOUR NAUSEA MEDICATION  *UNUSUAL SHORTNESS OF BREATH  *UNUSUAL BRUISING OR BLEEDING  TENDERNESS IN MOUTH AND THROAT WITH OR WITHOUT PRESENCE OF ULCERS  *URINARY PROBLEMS  *BOWEL PROBLEMS  UNUSUAL RASH Items with * indicate a potential emergency and should be followed up as soon as possible.  One of the nurses will contact you 24 hours after your treatment. Please let the nurse know about any problems that you may have experienced. Feel free to call the clinic you have any questions or concerns. The clinic phone number is [phone].   I have been informed and understand all the instructions given to me. I know to contact the clinic, my physician, or go to the Emergency Department if any problems should occur. I do not have any questions at this time, but understand that I may call the clinic during office hours   should I have any questions or need assistance in obtaining follow up care.    __________________________________________  _____________  __________ Signature of Patient or Authorized Representative            Date                   Time    __________________________________________ Nurse's Signature    

## 2011-09-18 NOTE — Progress Notes (Signed)
Hematology and Oncology Follow Up Visit  Rachael Weaver [id number] 1950-12-19 61 y.o. 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 She is currently week 2 cycle 1 of 6 planned every 3 week TCH with weekly Herceptin. Neulasta is utilize for granulocyte support on day 2.  Interim History:   Rachael Weaver is seen today , she developed a fever of 102 deg and she was asked to come in. She has  No other complaints, ie dysuria; she might have a little bit of cough and sinus drainage. . 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  A detailed review of systems is otherwise noncontributory as noted below.  Review of Systems: Constitutional:  no weight loss, fever, night sweats, feels well and fatigued Eyes: uses glasses ENT: no complaints Cardiovascular: no chest pain or dyspnea on exertion Respiratory: no cough, shortness of breath, or wheezing Neurological: no TIA or stroke symptoms Dermatological: negative Gastrointestinal: no abdominal pain, change in bowel habits, or black or bloody stools Genito-Urinary: no dysuria, trouble voiding, or hematuria Hematological and Lymphatic: negative Breast: negative Musculoskeletal: negative Remaining ROS negative.  Medications:   I have reviewed the patient's current medications.  Current Outpatient Prescriptions  Medication Sig Dispense Refill  . aspirin-acetaminophen-caffeine (EXCEDRIN MIGRAINE) 250-250-65 MG per tablet Take 2 tablets by mouth as needed.      Marland Kitchen dexamethasone (DECADRON) 4 MG tablet Take 2 tablets two times a day the day before Taxotere. Then take 2 tabs two times a day starting the day after chemo for 3 days.   30 tablet  1  . fexofenadine (ALLEGRA) 180 MG tablet Take 180 mg by mouth as needed.      Marland Kitchen LORazepam (ATIVAN) 0.5 MG tablet Take 1 tablet (0.5 mg total) by mouth every 6 (six) hours as needed (Nausea or vomiting).  30 tablet  0  . ondansetron (ZOFRAN) 8 MG tablet Take 1 tablet two times a day starting the day after chemo for 3 days. Then take 1 tab two times a day as needed for nausea or vomiting.  30 tablet  1  . prochlorperazine (COMPAZINE) 10 MG tablet Take 1 tablet (10 mg total) by mouth every 6 (six) hours as needed (Nausea or vomiting).  30 tablet  1  . prochlorperazine (COMPAZINE) 25 MG suppository Place 1 suppository (25 mg total) rectally every 12 (twelve) hours as needed for nausea.  12 suppository  3  . levofloxacin (LEVAQUIN) 500 MG tablet Take 1 tablet (500 mg total) by mouth daily.  7 tablet  0   No current facility-administered medications for this visit.   Facility-Administered Medications Ordered in Other Visits  Medication Dose Route Frequency Provider Last Rate Last Dose  . lidocaine-prilocaine (EMLA) cream   Topical Once Pierce Crane, MD        Allergies: No Known Allergies  Physical Exam: Filed Vitals:   09/16/11 1238  BP: 139/75  Pulse: 114  Temp: 101.2 F (38.4 C)    Body mass index is 28.82 kg/(m^2). Weight: 163 lbs. HEENT:  Sclerae anicteric, conjunctivae pink.  Oropharynx clear.  No mucositis or candidiasis.   Nodes:  No cervical, supraclavicular, or axillary lymphadenopathy palpated.  Breast Exam:  Deferred. Lungs:  Clear to auscultation bilaterally.  No crackles, rhonchi, or wheezes.   Heart:  Regular rate and rhythm.   Abdomen:  Soft, nontender.  Positive bowel sounds.  No organomegaly or masses palpated.   Musculoskeletal:  No focal spinal tenderness to palpation.  Extremities:  Benign.  No peripheral edema or cyanosis.   Skin:  Benign.   Neuro:  Nonfocal, alert and oriented x 3.  Lab Results: Lab Results  Component Value Date   WBC 19.3*  09/16/2011   HGB 12.9 09/16/2011   HCT 37.8 09/16/2011   MCV 89.4 09/16/2011   PLT 127* 09/16/2011   NEUTROABS 17.0* 09/16/2011     Chemistry      Component Value Date/Time   NA 135 09/11/2011 0845   K 4.4 09/11/2011 0845   CL 103 09/11/2011 0845   CO2 24 09/11/2011 0845   BUN 19 09/11/2011 0845   CREATININE 1.04 09/11/2011 0845      Component Value Date/Time   CALCIUM 8.4 09/11/2011 0845   ALKPHOS 94 09/11/2011 0845   AST 21 09/11/2011 0845   ALT 17 09/11/2011 0845   BILITOT 0.4 09/11/2011 0845      Lab Results  Component Value Date   LABCA2 33 07/30/2011    Assessment:  Her 2 + breast cancer, receiving TCH chemotherapy, s/p 1st cycle , now with high fevers, otherwise asymptomatic Case to be reviewed with Dr. Donnie Coffin upon his return.  Plan:  Rachael Weaver will be pancultured, she looks well and has the appropriate wbc response from neulasta, i will put her on empiric levaquin for 1 week.  Richardson Chiquito 09/18/2011

## 2011-09-22 LAB — CULTURE, BLOOD (SINGLE)

## 2011-09-25 ENCOUNTER — Ambulatory Visit (HOSPITAL_BASED_OUTPATIENT_CLINIC_OR_DEPARTMENT_OTHER): Payer: Medicaid Other | Admitting: Oncology

## 2011-09-25 ENCOUNTER — Other Ambulatory Visit (HOSPITAL_BASED_OUTPATIENT_CLINIC_OR_DEPARTMENT_OTHER): Payer: Medicaid Other | Admitting: Lab

## 2011-09-25 ENCOUNTER — Ambulatory Visit (HOSPITAL_BASED_OUTPATIENT_CLINIC_OR_DEPARTMENT_OTHER): Payer: Medicaid Other

## 2011-09-25 VITALS — BP 147/81 | HR 112 | Temp 98.6°F | Ht 64.0 in | Wt 167.5 lb

## 2011-09-25 DIAGNOSIS — C50419 Malignant neoplasm of upper-outer quadrant of unspecified female breast: Secondary | ICD-10-CM

## 2011-09-25 DIAGNOSIS — Z170 Estrogen receptor positive status [ER+]: Secondary | ICD-10-CM

## 2011-09-25 DIAGNOSIS — Z5111 Encounter for antineoplastic chemotherapy: Secondary | ICD-10-CM

## 2011-09-25 DIAGNOSIS — C50919 Malignant neoplasm of unspecified site of unspecified female breast: Secondary | ICD-10-CM

## 2011-09-25 LAB — CBC WITH DIFFERENTIAL/PLATELET
Eosinophils Absolute: 0 10*3/uL (ref 0.0–0.5)
LYMPH%: 8.8 % — ABNORMAL LOW (ref 14.0–49.7)
MONO#: 0.2 10*3/uL (ref 0.1–0.9)
NEUT#: 9.1 10*3/uL — ABNORMAL HIGH (ref 1.5–6.5)
Platelets: 362 10*3/uL (ref 145–400)
RBC: 4.02 10*6/uL (ref 3.70–5.45)
RDW: 13.4 % (ref 11.2–14.5)
WBC: 10.2 10*3/uL (ref 3.9–10.3)
lymph#: 0.9 10*3/uL (ref 0.9–3.3)

## 2011-09-25 LAB — COMPREHENSIVE METABOLIC PANEL
Albumin: 4.1 g/dL (ref 3.5–5.2)
CO2: 25 mEq/L (ref 19–32)
Chloride: 102 mEq/L (ref 96–112)
Glucose, Bld: 417 mg/dL — ABNORMAL HIGH (ref 70–99)
Potassium: 4.3 mEq/L (ref 3.5–5.3)
Sodium: 136 mEq/L (ref 135–145)
Total Protein: 6.4 g/dL (ref 6.0–8.3)

## 2011-09-25 LAB — LACTATE DEHYDROGENASE: LDH: 198 U/L (ref 94–250)

## 2011-09-25 MED ORDER — ACETAMINOPHEN 325 MG PO TABS
650.0000 mg | ORAL_TABLET | Freq: Once | ORAL | Status: AC
  Administered 2011-09-25: 650 mg via ORAL

## 2011-09-25 MED ORDER — DIPHENHYDRAMINE HCL 50 MG/ML IJ SOLN
12.5000 mg | Freq: Once | INTRAMUSCULAR | Status: AC
  Administered 2011-09-25: 12.5 mg via INTRAVENOUS

## 2011-09-25 MED ORDER — DEXTROSE 5 % IV SOLN
75.0000 mg/m2 | Freq: Once | INTRAVENOUS | Status: AC
  Administered 2011-09-25: 140 mg via INTRAVENOUS
  Filled 2011-09-25: qty 14

## 2011-09-25 MED ORDER — SODIUM CHLORIDE 0.9 % IV SOLN
570.0000 mg | Freq: Once | INTRAVENOUS | Status: AC
  Administered 2011-09-25: 570 mg via INTRAVENOUS
  Filled 2011-09-25: qty 57

## 2011-09-25 MED ORDER — SODIUM CHLORIDE 0.9 % IV SOLN: Freq: Once | INTRAVENOUS | Status: DC

## 2011-09-25 MED ORDER — DEXAMETHASONE SODIUM PHOSPHATE 4 MG/ML IJ SOLN
20.0000 mg | Freq: Once | INTRAMUSCULAR | Status: AC
  Administered 2011-09-25: 20 mg via INTRAVENOUS

## 2011-09-25 MED ORDER — ONDANSETRON 16 MG/50ML IVPB (CHCC)
16.0000 mg | Freq: Once | INTRAVENOUS | Status: AC
  Administered 2011-09-25: 16 mg via INTRAVENOUS

## 2011-09-25 MED ORDER — TRASTUZUMAB CHEMO INJECTION 440 MG
2.0000 mg/kg | Freq: Once | INTRAVENOUS | Status: AC
  Administered 2011-09-25: 147 mg via INTRAVENOUS
  Filled 2011-09-25: qty 7

## 2011-09-25 NOTE — Progress Notes (Signed)
Hematology and Oncology Follow Up Visit  Rachael Weaver [id number] Jul 21, 1950 61 y.o. 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 She is currently day 1 cycle 2 of 6 planned every 3 week TCH with weekly Herceptin. Neulasta is utilize for granulocyte support on day 2.  Interim History:   Rachael Weaver is seen today ,her fever resolved after 24 hrs , she took levaquin for 7 days. She feels fine, no tearing, neuropathy. She notes a slightly scratchy voice. No cough or sob.  A detailed review of systems is otherwise noncontributory as noted below.  Review of Systems: Constitutional:  no weight loss, fever, night sweats, feels well and fatigued Eyes: uses glasses ENT: no complaints Cardiovascular: no chest pain or dyspnea on exertion Respiratory: no cough, shortness of breath, or wheezing Neurological: no TIA or stroke symptoms Dermatological: negative Gastrointestinal: no abdominal pain, change in bowel habits, or black or bloody stools Genito-Urinary: no dysuria, trouble voiding, or hematuria Hematological and Lymphatic: negative Breast: negative Musculoskeletal: negative Remaining ROS negative.  Medications:   I have reviewed the patient's current medications.  Current Outpatient Prescriptions  Medication Sig Dispense Refill  . aspirin-acetaminophen-caffeine (EXCEDRIN MIGRAINE) 250-250-65 MG per tablet Take 2 tablets by mouth as needed.      Marland Kitchen dexamethasone (DECADRON) 4 MG tablet Take 2 tablets two times a day the day before Taxotere. Then take 2 tabs two times a day starting the day after chemo for 3 days.  30 tablet  1  . loratadine (CLARITIN) 10 MG tablet Take 10 mg by  mouth daily.      Marland Kitchen LORazepam (ATIVAN) 0.5 MG tablet Take 1 tablet (0.5 mg total) by mouth every 6 (six) hours as needed (Nausea or vomiting).  30 tablet  0  . ondansetron (ZOFRAN) 8 MG tablet Take 1 tablet two times a day starting the day after chemo for 3 days. Then take 1 tab two times a day as needed for nausea or vomiting.  30 tablet  1  . prochlorperazine (COMPAZINE) 10 MG tablet Take 1 tablet (10 mg total) by mouth every 6 (six) hours as needed (Nausea or vomiting).  30 tablet  1  . prochlorperazine (COMPAZINE) 25 MG suppository Place 1 suppository (25 mg total) rectally every 12 (twelve) hours as needed for nausea.  12 suppository  3   No current facility-administered medications for this visit.   Facility-Administered Medications Ordered in Other Visits  Medication Dose Route Frequency Provider Last Rate Last Dose  . lidocaine-prilocaine (EMLA) cream   Topical Once Pierce Crane, MD        Allergies: No Known Allergies  Physical Exam: Filed Vitals:   09/25/11 1216  BP: 147/81  Pulse: 112  Temp: 98.6 F (37 C)    Body mass index is 28.75 kg/(m^2). Weight: 163 lbs. HEENT:  Sclerae anicteric, conjunctivae pink.  Oropharynx clear.  No mucositis or candidiasis.   Nodes:  No cervical, supraclavicular, or axillary lymphadenopathy palpated.  Breast Exam:  Deferred. Lungs:  Clear to auscultation bilaterally.  No crackles, rhonchi, or wheezes.   Heart:  Regular rate and rhythm.   Abdomen:  Soft, nontender.  Positive bowel sounds.  No organomegaly or masses palpated.   Musculoskeletal:  No focal spinal tenderness to palpation.  Extremities:  Benign.  No peripheral edema or cyanosis.   Skin:  Benign.   Neuro:  Nonfocal, alert and oriented x 3.  Lab Results: Lab Results  Component Value Date   WBC 10.2 09/25/2011   HGB 12.4 09/25/2011   HCT 36.0 09/25/2011   MCV 89.6 09/25/2011   PLT 362 09/25/2011   NEUTROABS 9.1* 09/25/2011     Chemistry      Component Value Date/Time   NA 135  09/11/2011 0845   K 4.4 09/11/2011 0845   CL 103 09/11/2011 0845   CO2 24 09/11/2011 0845   BUN 19 09/11/2011 0845   CREATININE 1.04 09/11/2011 0845      Component Value Date/Time   CALCIUM 8.4 09/11/2011 0845   ALKPHOS 94 09/11/2011 0845   AST 21 09/11/2011 0845   ALT 17 09/11/2011 0845   BILITOT 0.4 09/11/2011 0845      Lab Results  Component Value Date   LABCA2 33 07/30/2011    Assessment:  Her 2 + breast cancer, receiving TCH chemotherapy, due for cycle 2.she is looking well. She will be seen in 1 week for f/u. Case to be reviewed with Dr. Donnie Coffin upon his return.    Pierce Crane, PA-C 09/25/2011

## 2011-09-26 ENCOUNTER — Ambulatory Visit (HOSPITAL_BASED_OUTPATIENT_CLINIC_OR_DEPARTMENT_OTHER): Payer: Medicaid Other

## 2011-09-26 VITALS — BP 127/77 | HR 91 | Temp 98.4°F

## 2011-09-26 DIAGNOSIS — C50419 Malignant neoplasm of upper-outer quadrant of unspecified female breast: Secondary | ICD-10-CM

## 2011-09-26 DIAGNOSIS — C50919 Malignant neoplasm of unspecified site of unspecified female breast: Secondary | ICD-10-CM

## 2011-09-26 MED ORDER — PEGFILGRASTIM INJECTION 6 MG/0.6ML
6.0000 mg | Freq: Once | SUBCUTANEOUS | Status: AC
  Administered 2011-09-26: 6 mg via SUBCUTANEOUS

## 2011-10-02 ENCOUNTER — Ambulatory Visit (HOSPITAL_BASED_OUTPATIENT_CLINIC_OR_DEPARTMENT_OTHER): Payer: Medicaid Other

## 2011-10-02 ENCOUNTER — Ambulatory Visit (HOSPITAL_BASED_OUTPATIENT_CLINIC_OR_DEPARTMENT_OTHER): Payer: Medicaid Other | Admitting: Physician Assistant

## 2011-10-02 ENCOUNTER — Other Ambulatory Visit: Payer: Self-pay | Admitting: *Deleted

## 2011-10-02 ENCOUNTER — Other Ambulatory Visit: Payer: Self-pay | Admitting: Oncology

## 2011-10-02 ENCOUNTER — Telehealth: Payer: Self-pay | Admitting: *Deleted

## 2011-10-02 ENCOUNTER — Other Ambulatory Visit (HOSPITAL_BASED_OUTPATIENT_CLINIC_OR_DEPARTMENT_OTHER): Payer: Medicaid Other

## 2011-10-02 VITALS — BP 125/78 | HR 98 | Temp 98.4°F | Ht 64.0 in | Wt 168.9 lb

## 2011-10-02 VITALS — BP 118/78 | HR 92 | Temp 98.8°F

## 2011-10-02 DIAGNOSIS — Z170 Estrogen receptor positive status [ER+]: Secondary | ICD-10-CM

## 2011-10-02 DIAGNOSIS — C50919 Malignant neoplasm of unspecified site of unspecified female breast: Secondary | ICD-10-CM

## 2011-10-02 DIAGNOSIS — C50419 Malignant neoplasm of upper-outer quadrant of unspecified female breast: Secondary | ICD-10-CM

## 2011-10-02 DIAGNOSIS — Z5112 Encounter for antineoplastic immunotherapy: Secondary | ICD-10-CM

## 2011-10-02 LAB — COMPREHENSIVE METABOLIC PANEL
ALT: 21 U/L (ref 0–35)
AST: 22 U/L (ref 0–37)
Alkaline Phosphatase: 104 U/L (ref 39–117)
Potassium: 4.1 mEq/L (ref 3.5–5.3)
Sodium: 139 mEq/L (ref 135–145)
Total Bilirubin: 0.4 mg/dL (ref 0.3–1.2)
Total Protein: 6.1 g/dL (ref 6.0–8.3)

## 2011-10-02 LAB — CBC WITH DIFFERENTIAL/PLATELET
EOS%: 0.3 % (ref 0.0–7.0)
Eosinophils Absolute: 0.1 10*3/uL (ref 0.0–0.5)
MCH: 30.3 pg (ref 25.1–34.0)
MCV: 89.8 fL (ref 79.5–101.0)
MONO%: 19.9 % — ABNORMAL HIGH (ref 0.0–14.0)
NEUT#: 7.6 10*3/uL — ABNORMAL HIGH (ref 1.5–6.5)
RBC: 4 10*6/uL (ref 3.70–5.45)
RDW: 13.2 % (ref 11.2–14.5)
lymph#: 3.8 10*3/uL — ABNORMAL HIGH (ref 0.9–3.3)
nRBC: 0 % (ref 0–0)

## 2011-10-02 MED ORDER — ACETAMINOPHEN 325 MG PO TABS
650.0000 mg | ORAL_TABLET | Freq: Once | ORAL | Status: AC
  Administered 2011-10-02: 650 mg via ORAL

## 2011-10-02 MED ORDER — DIPHENHYDRAMINE HCL 50 MG/ML IJ SOLN: 12.5000 mg | Freq: Once | INTRAMUSCULAR | Status: DC

## 2011-10-02 MED ORDER — SODIUM CHLORIDE 0.9 % IV SOLN
Freq: Once | INTRAVENOUS | Status: AC
  Administered 2011-10-02: 10:00:00 via INTRAVENOUS

## 2011-10-02 MED ORDER — SODIUM CHLORIDE 0.9 % IJ SOLN
10.0000 mL | INTRAMUSCULAR | Status: DC | PRN
  Administered 2011-10-02: 10 mL
  Filled 2011-10-02: qty 10

## 2011-10-02 MED ORDER — HEPARIN SOD (PORK) LOCK FLUSH 100 UNIT/ML IV SOLN
500.0000 [IU] | Freq: Once | INTRAVENOUS | Status: AC | PRN
  Administered 2011-10-02: 500 [IU]
  Filled 2011-10-02: qty 5

## 2011-10-02 MED ORDER — TRASTUZUMAB CHEMO INJECTION 440 MG
2.0000 mg/kg | Freq: Once | INTRAVENOUS | Status: AC
  Administered 2011-10-02: 147 mg via INTRAVENOUS
  Filled 2011-10-02: qty 7

## 2011-10-02 NOTE — Patient Instructions (Signed)
[facility] Discharge Instructions for Patients Receiving Chemotherapy  Today you received the following chemotherapy agents Herceptin To help prevent nausea and vomiting after your treatment, we encourage you to take your nausea medication as prescribed.  If you develop nausea and vomiting that is not controlled by your nausea medication, call the clinic. If it is after clinic hours your family physician or the after hours number for the clinic or go to the Emergency Department.   BELOW ARE SYMPTOMS THAT SHOULD BE REPORTED IMMEDIATELY:  *FEVER GREATER THAN 100.5 F  *CHILLS WITH OR WITHOUT FEVER  NAUSEA AND VOMITING THAT IS NOT CONTROLLED WITH YOUR NAUSEA MEDICATION  *UNUSUAL SHORTNESS OF BREATH  *UNUSUAL BRUISING OR BLEEDING  TENDERNESS IN MOUTH AND THROAT WITH OR WITHOUT PRESENCE OF ULCERS  *URINARY PROBLEMS  *BOWEL PROBLEMS  UNUSUAL RASH Items with * indicate a potential emergency and should be followed up as soon as possible.  One of the nurses will contact you 24 hours after your treatment. Please let the nurse know about any problems that you may have experienced. Feel free to call the clinic you have any questions or concerns. The clinic phone number is [phone].   I have been informed and understand all the instructions given to me. I know to contact the clinic, my physician, or go to the Emergency Department if any problems should occur. I do not have any questions at this time, but understand that I may call the clinic during office hours   should I have any questions or need assistance in obtaining follow up care.    __________________________________________  _____________  __________ Signature of Patient or Authorized Representative            Date                   Time    __________________________________________ Nurse's Signature    

## 2011-10-02 NOTE — Telephone Encounter (Signed)
SENT Rachael Weaver EMAIL THREW EPIC TO ADD THE PATIENT'S TREATMENT APPOINTMENTS

## 2011-10-02 NOTE — Progress Notes (Signed)
Hematology and Oncology Follow Up Visit  Rachael Weaver [id number] 09-13-1950 61 y.o. 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 She is currently week 2 cycle 2 of 6 planned every 3 week TCH with weekly Herceptin. Neulasta is utilize for granulocyte support on day 2.  Interim History:   Rachael Weaver is seen today for followup after her second cycle of every 3 week TCH. She is due for Herceptin today. She actually feels quite well overall denying any unexplained fevers, chills, or night sweats. She did receive Neulasta on day 2, she denies a diffuse bony discomfort. No shortness of breath or chest pain. She has not been troubled with nausea, or emesis. She had a small amount of constipation which has resolved. She is staying well hydrated. Her only complaint is the noted mild "chemo brain". 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 No peripheral neuropathy or nailbed dyscrasia. A detailed review of systems is otherwise noncontributory as noted below.  Review of Systems: Constitutional:  no weight loss, fever, night sweats, feels well and fatigued Eyes: uses glasses ENT: no complaints Cardiovascular: no chest pain or dyspnea on exertion Respiratory: no cough, shortness of breath, or wheezing Neurological: no TIA or stroke symptoms Dermatological: negative Gastrointestinal: no abdominal pain, change in bowel habits, or black or bloody stools Genito-Urinary: no dysuria, trouble voiding, or hematuria Hematological and Lymphatic: negative Breast: negative Musculoskeletal: negative Remaining ROS negative.  Medications:   I have reviewed the patient's current  medications.  Current Outpatient Prescriptions  Medication Sig Dispense Refill  . aspirin-acetaminophen-caffeine (EXCEDRIN MIGRAINE) 250-250-65 MG per tablet Take 2 tablets by mouth as needed.      Marland Kitchen dexamethasone (DECADRON) 4 MG tablet Take 2 tablets two times a day the day before Taxotere. Then take 2 tabs two times a day starting the day after chemo for 3 days.  30 tablet  1  . loratadine (CLARITIN) 10 MG tablet Take 10 mg by mouth daily.      Marland Kitchen LORazepam (ATIVAN) 0.5 MG tablet Take 1 tablet (0.5 mg total) by mouth every 6 (six) hours as needed (Nausea or vomiting).  30 tablet  0  . ondansetron (ZOFRAN) 8 MG tablet Take 1 tablet two times a day starting the day after chemo for 3 days. Then take 1 tab two times a day as needed for nausea or vomiting.  30 tablet  1  . prochlorperazine (COMPAZINE) 10 MG tablet Take 1 tablet (10 mg total) by mouth every 6 (six) hours as needed (Nausea or vomiting).  30 tablet  1  . prochlorperazine (COMPAZINE) 25 MG suppository Place 1 suppository (25 mg total) rectally every 12 (twelve) hours as needed for nausea.  12 suppository  3   No current facility-administered medications for this visit.   Facility-Administered Medications Ordered in Other Visits  Medication Dose Route Frequency Provider Last Rate Last Dose  . 0.9 %  sodium chloride infusion   Intravenous Once Pierce Crane, MD      . acetaminophen (TYLENOL) tablet 650 mg  650 mg Oral Once Pierce Crane, MD   650 mg at 10/02/11 0955  . heparin lock flush 100 unit/mL  500 Units Intracatheter Once PRN Theron Arista  Donnie Coffin, MD   500 Units at 10/02/11 1114  . trastuzumab (HERCEPTIN) 147 mg in sodium chloride 0.9 % 250 mL chemo infusion  2 mg/kg (Treatment Plan Actual) Intravenous Once Pierce Crane, MD   147 mg at 10/02/11 1025  . DISCONTD: diphenhydrAMINE (BENADRYL) injection 12.5 mg  12.5 mg Intravenous Once Pierce Crane, MD      . DISCONTD: sodium chloride 0.9 % injection 10 mL  10 mL Intracatheter PRN Pierce Crane, MD    10 mL at 10/02/11 1114    Allergies: No Known Allergies  Physical Exam: Filed Vitals:   10/02/11 1130  BP: 125/78  Pulse: 98  Temp: 98.4 F (36.9 C)    Body mass index is 28.99 kg/(m^2). Weight: 168 lbs. HEENT:  Sclerae anicteric, conjunctivae pink.  Oropharynx clear.  No mucositis or candidiasis.   Nodes:  No cervical, supraclavicular, or axillary lymphadenopathy palpated.  Breast Exam:  Deferred. Lungs:  Clear to auscultation bilaterally.  No crackles, rhonchi, or wheezes.   Heart:  Regular rate and rhythm.   Abdomen:  Soft, nontender.  Positive bowel sounds.  No organomegaly or masses palpated.   Musculoskeletal:  No focal spinal tenderness to palpation.  Extremities:  Benign.  No peripheral edema or cyanosis.   Skin:  Benign.   Neuro:  Nonfocal, alert and oriented x 3.  Lab Results: Lab Results  Component Value Date   WBC 14.4* 10/02/2011   HGB 12.1 10/02/2011   HCT 35.9 10/02/2011   MCV 89.8 10/02/2011   PLT 257 10/02/2011   NEUTROABS 7.6* 10/02/2011     Chemistry      Component Value Date/Time   NA 136 09/25/2011 1141   K 4.3 09/25/2011 1141   CL 102 09/25/2011 1141   CO2 25 09/25/2011 1141   BUN 18 09/25/2011 1141   CREATININE 0.87 09/25/2011 1141      Component Value Date/Time   CALCIUM 9.1 09/25/2011 1141   ALKPHOS 108 09/25/2011 1141   AST 12 09/25/2011 1141   ALT 16 09/25/2011 1141   BILITOT 0.3 09/25/2011 1141      Lab Results  Component Value Date   LABCA2 33 07/30/2011    Assessment:  Rachael Weaver is a 61 year old [place] woman with a 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 She is currently week 2 cycle 2 of 6 planned every 3 week TCH with weekly Herceptin. Neulasta is utilize for granulocyte support on day  2.  Case reviewed with Dr. Donnie Coffin.   Plan:  Rachael Weaver will receive week 2 cycle 1 of every 3 week TCH today as scheduled which is single agent Herceptin. Her Benadryl has been decreased to 12.5 mg. She will return in one week's time for labs and week 3 cycle 1 which again is single agent Herceptin, next follow up exam on 10/16/11 correlating with week 1 cycle 3 of every 3 week TCH. This plan was reviewed with the patient, who voices understanding and agreement.  She knows to call with any changes or problems.    Sarabi Sockwell T, PA-C 10/02/2011

## 2011-10-09 ENCOUNTER — Ambulatory Visit (HOSPITAL_BASED_OUTPATIENT_CLINIC_OR_DEPARTMENT_OTHER): Payer: Medicaid Other

## 2011-10-09 ENCOUNTER — Other Ambulatory Visit: Payer: Medicaid Other | Admitting: Lab

## 2011-10-09 VITALS — BP 124/75 | HR 78 | Temp 97.7°F

## 2011-10-09 DIAGNOSIS — C50419 Malignant neoplasm of upper-outer quadrant of unspecified female breast: Secondary | ICD-10-CM

## 2011-10-09 DIAGNOSIS — Z5112 Encounter for antineoplastic immunotherapy: Secondary | ICD-10-CM

## 2011-10-09 DIAGNOSIS — C50919 Malignant neoplasm of unspecified site of unspecified female breast: Secondary | ICD-10-CM

## 2011-10-09 MED ORDER — SODIUM CHLORIDE 0.9 % IJ SOLN
10.0000 mL | INTRAMUSCULAR | Status: DC | PRN
  Administered 2011-10-09: 10 mL
  Filled 2011-10-09: qty 10

## 2011-10-09 MED ORDER — ACETAMINOPHEN 325 MG PO TABS
650.0000 mg | ORAL_TABLET | Freq: Once | ORAL | Status: AC
  Administered 2011-10-09: 650 mg via ORAL

## 2011-10-09 MED ORDER — HEPARIN SOD (PORK) LOCK FLUSH 100 UNIT/ML IV SOLN
500.0000 [IU] | Freq: Once | INTRAVENOUS | Status: AC | PRN
  Administered 2011-10-09: 500 [IU]
  Filled 2011-10-09: qty 5

## 2011-10-09 MED ORDER — TRASTUZUMAB CHEMO INJECTION 440 MG
2.0000 mg/kg | Freq: Once | INTRAVENOUS | Status: AC
  Administered 2011-10-09: 147 mg via INTRAVENOUS
  Filled 2011-10-09: qty 7

## 2011-10-09 MED ORDER — DIPHENHYDRAMINE HCL 50 MG/ML IJ SOLN
12.5000 mg | Freq: Once | INTRAMUSCULAR | Status: AC
  Administered 2011-10-09: 12.5 mg via INTRAVENOUS

## 2011-10-09 MED ORDER — SODIUM CHLORIDE 0.9 % IV SOLN
Freq: Once | INTRAVENOUS | Status: AC
  Administered 2011-10-09: 10:00:00 via INTRAVENOUS

## 2011-10-09 NOTE — Progress Notes (Signed)
Upon entering the infusion center Rachael Weaver stated that she thought her port is infected. She stated that it had turned pink with red streaks....emanating from the port site.  Rachael Weaver denied pain, itching and other discomfort. Dr. Donnie Coffin stated that he will assess the site.

## 2011-10-09 NOTE — Patient Instructions (Addendum)
[facility] Discharge Instructions for Patients Receiving Chemotherapy  Today you received the following chemotherapy agents Herceptin  To help prevent nausea and vomiting after your treatment, we encourage you to take your nausea medication Begin taking it at 7 pm and take it as often as prescribed for the next 24 to 72 hours.   If you develop nausea and vomiting that is not controlled by your nausea medication, call the clinic. If it is after clinic hours your family physician or the after hours number for the clinic or go to the Emergency Department.   BELOW ARE SYMPTOMS THAT SHOULD BE REPORTED IMMEDIATELY:  *FEVER GREATER THAN 100.5 F  *CHILLS WITH OR WITHOUT FEVER  NAUSEA AND VOMITING THAT IS NOT CONTROLLED WITH YOUR NAUSEA MEDICATION  *UNUSUAL SHORTNESS OF BREATH  *UNUSUAL BRUISING OR BLEEDING  TENDERNESS IN MOUTH AND THROAT WITH OR WITHOUT PRESENCE OF ULCERS  *URINARY PROBLEMS  *BOWEL PROBLEMS  UNUSUAL RASH Items with * indicate a potential emergency and should be followed up as soon as possible.  One of the nurses will contact you 24 hours after your treatment. Please let the nurse know about any problems that you may have experienced. Feel free to call the clinic you have any questions or concerns. The clinic phone number is [phone].   I have been informed and understand all the instructions given to me. I know to contact the clinic, my physician, or go to the Emergency Department if any problems should occur. I do not have any questions at this time, but understand that I may call the clinic during office hours   should I have any questions or need assistance in obtaining follow up care.    __________________________________________  _____________  __________ Signature of Patient or Authorized Representative            Date                   Time    __________________________________________ Nurse's Signature    

## 2011-10-16 ENCOUNTER — Encounter: Payer: Self-pay | Admitting: Physician Assistant

## 2011-10-16 ENCOUNTER — Other Ambulatory Visit: Payer: Self-pay | Admitting: Physician Assistant

## 2011-10-16 ENCOUNTER — Other Ambulatory Visit (HOSPITAL_BASED_OUTPATIENT_CLINIC_OR_DEPARTMENT_OTHER): Payer: Medicaid Other | Admitting: Lab

## 2011-10-16 ENCOUNTER — Ambulatory Visit (HOSPITAL_BASED_OUTPATIENT_CLINIC_OR_DEPARTMENT_OTHER): Payer: Medicaid Other | Admitting: Physician Assistant

## 2011-10-16 ENCOUNTER — Ambulatory Visit (HOSPITAL_BASED_OUTPATIENT_CLINIC_OR_DEPARTMENT_OTHER): Payer: Medicaid Other

## 2011-10-16 VITALS — BP 143/80 | HR 102 | Temp 98.5°F | Ht 64.0 in | Wt 170.4 lb

## 2011-10-16 DIAGNOSIS — C50919 Malignant neoplasm of unspecified site of unspecified female breast: Secondary | ICD-10-CM

## 2011-10-16 DIAGNOSIS — Z170 Estrogen receptor positive status [ER+]: Secondary | ICD-10-CM

## 2011-10-16 DIAGNOSIS — C50419 Malignant neoplasm of upper-outer quadrant of unspecified female breast: Secondary | ICD-10-CM

## 2011-10-16 DIAGNOSIS — Z5111 Encounter for antineoplastic chemotherapy: Secondary | ICD-10-CM

## 2011-10-16 LAB — COMPREHENSIVE METABOLIC PANEL
ALT: 30 U/L (ref 0–35)
AST: 18 U/L (ref 0–37)
Alkaline Phosphatase: 97 U/L (ref 39–117)
BUN: 17 mg/dL (ref 6–23)
Calcium: 9.6 mg/dL (ref 8.4–10.5)
Creatinine, Ser: 0.82 mg/dL (ref 0.50–1.10)
Total Bilirubin: 0.4 mg/dL (ref 0.3–1.2)

## 2011-10-16 LAB — CBC WITH DIFFERENTIAL/PLATELET
BASO%: 0 % (ref 0.0–2.0)
Basophils Absolute: 0 10*3/uL (ref 0.0–0.1)
EOS%: 0 % (ref 0.0–7.0)
HCT: 35.3 % (ref 34.8–46.6)
HGB: 12.1 g/dL (ref 11.6–15.9)
LYMPH%: 9.8 % — ABNORMAL LOW (ref 14.0–49.7)
MCH: 31.1 pg (ref 25.1–34.0)
MCHC: 34.3 g/dL (ref 31.5–36.0)
MCV: 90.7 fL (ref 79.5–101.0)
MONO%: 3.4 % (ref 0.0–14.0)
NEUT%: 86.8 % — ABNORMAL HIGH (ref 38.4–76.8)

## 2011-10-16 MED ORDER — ONDANSETRON 16 MG/50ML IVPB (CHCC)
16.0000 mg | Freq: Once | INTRAVENOUS | Status: AC
  Administered 2011-10-16: 16 mg via INTRAVENOUS

## 2011-10-16 MED ORDER — DOCETAXEL CHEMO INJECTION 160 MG/16ML
75.0000 mg/m2 | Freq: Once | INTRAVENOUS | Status: AC
  Administered 2011-10-16: 140 mg via INTRAVENOUS
  Filled 2011-10-16: qty 14

## 2011-10-16 MED ORDER — HEPARIN SOD (PORK) LOCK FLUSH 100 UNIT/ML IV SOLN
500.0000 [IU] | Freq: Once | INTRAVENOUS | Status: AC | PRN
  Administered 2011-10-16: 500 [IU]
  Filled 2011-10-16: qty 5

## 2011-10-16 MED ORDER — DIPHENHYDRAMINE HCL 25 MG PO CAPS: 50.0000 mg | ORAL_CAPSULE | Freq: Once | ORAL | Status: DC

## 2011-10-16 MED ORDER — SODIUM CHLORIDE 0.9 % IJ SOLN
10.0000 mL | INTRAMUSCULAR | Status: DC | PRN
  Administered 2011-10-16: 10 mL
  Filled 2011-10-16: qty 10

## 2011-10-16 MED ORDER — SODIUM CHLORIDE 0.9 % IV SOLN
645.0000 mg | Freq: Once | INTRAVENOUS | Status: AC
  Administered 2011-10-16: 650 mg via INTRAVENOUS
  Filled 2011-10-16: qty 65

## 2011-10-16 MED ORDER — TRASTUZUMAB CHEMO INJECTION 440 MG
2.0000 mg/kg | Freq: Once | INTRAVENOUS | Status: AC
  Administered 2011-10-16: 147 mg via INTRAVENOUS
  Filled 2011-10-16: qty 7

## 2011-10-16 MED ORDER — DEXAMETHASONE SODIUM PHOSPHATE 4 MG/ML IJ SOLN
20.0000 mg | Freq: Once | INTRAMUSCULAR | Status: AC
  Administered 2011-10-16: 20 mg via INTRAVENOUS

## 2011-10-16 MED ORDER — DIPHENHYDRAMINE HCL 50 MG/ML IJ SOLN
12.5000 mg | Freq: Once | INTRAMUSCULAR | Status: AC
  Administered 2011-10-16: 12.5 mg via INTRAVENOUS

## 2011-10-16 MED ORDER — SODIUM CHLORIDE 0.9 % IV SOLN: Freq: Once | INTRAVENOUS | Status: DC

## 2011-10-16 MED ORDER — ACETAMINOPHEN 325 MG PO TABS
650.0000 mg | ORAL_TABLET | Freq: Once | ORAL | Status: AC
  Administered 2011-10-16: 650 mg via ORAL

## 2011-10-16 NOTE — Patient Instructions (Signed)
[facility] Discharge Instructions for Patients Receiving Chemotherapy  Today you received the following chemotherapy agents Taxotere/Herceptin/Carboplatin To help prevent nausea and vomiting after your treatment, we encourage you to take your nausea medication as prescribed.  If you develop nausea and vomiting that is not controlled by your nausea medication, call the clinic. If it is after clinic hours your family physician or the after hours number for the clinic or go to the Emergency Department.   BELOW ARE SYMPTOMS THAT SHOULD BE REPORTED IMMEDIATELY:  *FEVER GREATER THAN 100.5 F  *CHILLS WITH OR WITHOUT FEVER  NAUSEA AND VOMITING THAT IS NOT CONTROLLED WITH YOUR NAUSEA MEDICATION  *UNUSUAL SHORTNESS OF BREATH  *UNUSUAL BRUISING OR BLEEDING  TENDERNESS IN MOUTH AND THROAT WITH OR WITHOUT PRESENCE OF ULCERS  *URINARY PROBLEMS  *BOWEL PROBLEMS  UNUSUAL RASH Items with * indicate a potential emergency and should be followed up as soon as possible.  One of the nurses will contact you 24 hours after your treatment. Please let the nurse know about any problems that you may have experienced. Feel free to call the clinic you have any questions or concerns. The clinic phone number is [phone].   I have been informed and understand all the instructions given to me. I know to contact the clinic, my physician, or go to the Emergency Department if any problems should occur. I do not have any questions at this time, but understand that I may call the clinic during office hours   should I have any questions or need assistance in obtaining follow up care.    __________________________________________  _____________  __________ Signature of Patient or Authorized Representative            Date                   Time    __________________________________________ Nurse's Signature    

## 2011-10-16 NOTE — Progress Notes (Signed)
Hematology and Oncology Follow Up Visit  Rachael Weaver [id number] 02-05-1951 61 y.o. 10/16/2011    HPI: Ms. 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 She is currently day 1 cycle 3/6 planned every 3 week TCH with weekly Herceptin. Neulasta is utilize for granulocyte support on day 2.  Interim History:   Enyah is seen today prior to week 1 cycle 3 of 6 planned Taxotere/carboplatin and Herceptin. Again, Neulasta support on day 2. She is feeling well, denying any unexplained fevers, chills, or night sweats. She does have some easy fatigability with exertion, but denies frank shortness of breath otherwise, no cough. She's had some trace edema but does not reach her ankles, no frank pedal edema. She is urinating fine. She denies any nausea, emesis, diarrhea, or constipation. She denies any frank mouth sores. No nailbed sensitivity or neuropathy. No rashes. A detailed review of systems is otherwise noncontributory as noted below.  Review of Systems: Constitutional:  no weight loss, fever, night sweats, feels well and fatigued Eyes: uses glasses ENT: no complaints Cardiovascular: no chest pain or dyspnea on exertion Respiratory: no cough, shortness of breath, or wheezing Neurological: no TIA or stroke symptoms Dermatological: negative Gastrointestinal: no abdominal pain, change in bowel habits, or black or bloody stools Genito-Urinary: no dysuria, trouble voiding, or hematuria Hematological and Lymphatic: negative Breast: negative Musculoskeletal: negative Remaining ROS negative.  Medications:   I have reviewed the patient's current medications.  Current Outpatient Prescriptions  Medication Sig  Dispense Refill  . aspirin-acetaminophen-caffeine (EXCEDRIN MIGRAINE) 250-250-65 MG per tablet Take 2 tablets by mouth as needed.      Marland Kitchen dexamethasone (DECADRON) 4 MG tablet Take 2 tablets two times a day the day before Taxotere. Then take 2 tabs two times a day starting the day after chemo for 3 days.  30 tablet  1  . loratadine (CLARITIN) 10 MG tablet Take 10 mg by mouth daily.      Marland Kitchen LORazepam (ATIVAN) 0.5 MG tablet Take 1 tablet (0.5 mg total) by mouth every 6 (six) hours as needed (Nausea or vomiting).  30 tablet  0  . ondansetron (ZOFRAN) 8 MG tablet Take 1 tablet two times a day starting the day after chemo for 3 days. Then take 1 tab two times a day as needed for nausea or vomiting.  30 tablet  1  . prochlorperazine (COMPAZINE) 10 MG tablet Take 1 tablet (10 mg total) by mouth every 6 (six) hours as needed (Nausea or vomiting).  30 tablet  1  . prochlorperazine (COMPAZINE) 25 MG suppository Place 1 suppository (25 mg total) rectally every 12 (twelve) hours as needed for nausea.  12 suppository  3    Allergies: No Known Allergies  Physical Exam: Filed Vitals:   10/16/11 0911  BP: 143/80  Pulse: 102  Temp: 98.5 F (36.9 C)    Body mass index is 29.25 kg/(m^2). Weight: 170 lbs. HEENT:  Sclerae anicteric, conjunctivae pink.  Oropharynx clear.  No mucositis or candidiasis.   Nodes:  No cervical, supraclavicular, or axillary lymphadenopathy palpated.  Breast Exam:  Deferred. Lungs:  Clear to auscultation bilaterally.  No crackles, rhonchi, or wheezes.   Heart:  Regular rate and rhythm.   Abdomen:  Soft, nontender.  Positive bowel sounds.  No organomegaly or masses palpated.   Musculoskeletal:  No focal spinal tenderness to palpation.  Extremities:  Benign.  No peripheral edema or cyanosis.   Skin:  Benign.   Neuro:  Nonfocal, alert and oriented x 3.  Lab Results: Lab Results  Component Value Date   WBC 11.4* 10/16/2011   HGB 12.1 10/16/2011   HCT 35.3 10/16/2011   MCV 90.7  10/16/2011   PLT 227 10/16/2011   NEUTROABS 9.9* 10/16/2011     Chemistry      Component Value Date/Time   NA 139 10/02/2011 0904   K 4.1 10/02/2011 0904   CL 101 10/02/2011 0904   CO2 28 10/02/2011 0904   BUN 13 10/02/2011 0904   CREATININE 0.93 10/02/2011 0904      Component Value Date/Time   CALCIUM 8.6 10/02/2011 0904   ALKPHOS 104 10/02/2011 0904   AST 22 10/02/2011 0904   ALT 21 10/02/2011 0904   BILITOT 0.4 10/02/2011 0904      Lab Results  Component Value Date   LABCA2 33 07/30/2011    Assessment:  Ms. 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 She is currently day 1 cycle 3/6 planned every 3 week TCH with weekly Herceptin. Neulasta is utilize for granulocyte support on day 2.  Case reviewed with Dr. Pierce Crane.  Plan: Meilin will receive week 1 cycle 3 of 6 planned every 3 week Taxotere/carboplatin and Herceptin today as scheduled. She'll return on day 2 for Neulasta support. We will see her back in one week's time prior to week 2 which will be Herceptin alone. She knows to contact us sooner if the need should arise.  Apple Dearmas T, PA-C 10/16/2011

## 2011-10-17 ENCOUNTER — Ambulatory Visit (HOSPITAL_BASED_OUTPATIENT_CLINIC_OR_DEPARTMENT_OTHER): Payer: Medicaid Other

## 2011-10-17 VITALS — BP 130/74 | HR 91 | Temp 98.0°F

## 2011-10-17 DIAGNOSIS — C50419 Malignant neoplasm of upper-outer quadrant of unspecified female breast: Secondary | ICD-10-CM

## 2011-10-17 DIAGNOSIS — C50919 Malignant neoplasm of unspecified site of unspecified female breast: Secondary | ICD-10-CM

## 2011-10-17 MED ORDER — PEGFILGRASTIM INJECTION 6 MG/0.6ML
6.0000 mg | Freq: Once | SUBCUTANEOUS | Status: AC
  Administered 2011-10-17: 6 mg via SUBCUTANEOUS
  Filled 2011-10-17: qty 0.6

## 2011-10-23 ENCOUNTER — Telehealth: Payer: Self-pay | Admitting: *Deleted

## 2011-10-23 ENCOUNTER — Ambulatory Visit (HOSPITAL_BASED_OUTPATIENT_CLINIC_OR_DEPARTMENT_OTHER): Payer: Medicaid Other

## 2011-10-23 ENCOUNTER — Ambulatory Visit (HOSPITAL_BASED_OUTPATIENT_CLINIC_OR_DEPARTMENT_OTHER): Payer: Medicaid Other | Admitting: Physician Assistant

## 2011-10-23 ENCOUNTER — Other Ambulatory Visit: Payer: Self-pay | Admitting: Physician Assistant

## 2011-10-23 ENCOUNTER — Other Ambulatory Visit: Payer: Medicaid Other | Admitting: Lab

## 2011-10-23 VITALS — BP 119/72 | HR 106 | Temp 98.5°F | Ht 64.0 in | Wt 169.1 lb

## 2011-10-23 DIAGNOSIS — C50419 Malignant neoplasm of upper-outer quadrant of unspecified female breast: Secondary | ICD-10-CM

## 2011-10-23 DIAGNOSIS — C50919 Malignant neoplasm of unspecified site of unspecified female breast: Secondary | ICD-10-CM

## 2011-10-23 DIAGNOSIS — Z5112 Encounter for antineoplastic immunotherapy: Secondary | ICD-10-CM

## 2011-10-23 DIAGNOSIS — Z170 Estrogen receptor positive status [ER+]: Secondary | ICD-10-CM

## 2011-10-23 DIAGNOSIS — G609 Hereditary and idiopathic neuropathy, unspecified: Secondary | ICD-10-CM

## 2011-10-23 LAB — CBC WITH DIFFERENTIAL/PLATELET
Basophils Absolute: 0 10*3/uL (ref 0.0–0.1)
EOS%: 0.2 % (ref 0.0–7.0)
Eosinophils Absolute: 0 10*3/uL (ref 0.0–0.5)
HGB: 11.5 g/dL — ABNORMAL LOW (ref 11.6–15.9)
LYMPH%: 35.3 % (ref 14.0–49.7)
MCH: 31.4 pg (ref 25.1–34.0)
MCV: 92.1 fL (ref 79.5–101.0)
MONO%: 17.5 % — ABNORMAL HIGH (ref 0.0–14.0)
NEUT#: 3.8 10*3/uL (ref 1.5–6.5)
Platelets: 203 10*3/uL (ref 145–400)

## 2011-10-23 MED ORDER — ACETAMINOPHEN 325 MG PO TABS
650.0000 mg | ORAL_TABLET | Freq: Once | ORAL | Status: AC
  Administered 2011-10-23: 650 mg via ORAL

## 2011-10-23 MED ORDER — DIPHENHYDRAMINE HCL 50 MG/ML IJ SOLN
12.5000 mg | Freq: Once | INTRAMUSCULAR | Status: AC
  Administered 2011-10-23: 12.5 mg via INTRAVENOUS

## 2011-10-23 MED ORDER — TRASTUZUMAB CHEMO INJECTION 440 MG
2.0000 mg/kg | Freq: Once | INTRAVENOUS | Status: AC
  Administered 2011-10-23: 147 mg via INTRAVENOUS
  Filled 2011-10-23: qty 7

## 2011-10-23 MED ORDER — SODIUM CHLORIDE 0.9 % IJ SOLN
10.0000 mL | INTRAMUSCULAR | Status: DC | PRN
  Administered 2011-10-23: 10 mL
  Filled 2011-10-23: qty 10

## 2011-10-23 MED ORDER — SODIUM CHLORIDE 0.9 % IV SOLN
Freq: Once | INTRAVENOUS | Status: AC
  Administered 2011-10-23: 10:00:00 via INTRAVENOUS

## 2011-10-23 MED ORDER — HEPARIN SOD (PORK) LOCK FLUSH 100 UNIT/ML IV SOLN
500.0000 [IU] | Freq: Once | INTRAVENOUS | Status: AC | PRN
  Administered 2011-10-23: 500 [IU]
  Filled 2011-10-23: qty 5

## 2011-10-23 NOTE — Patient Instructions (Signed)
[facility] Discharge Instructions for Patients Receiving Chemotherapy  Today you received the following chemotherapy agents Herceptin To help prevent nausea and vomiting after your treatment, we encourage you to take your nausea medication as prescribed.  If you develop nausea and vomiting that is not controlled by your nausea medication, call the clinic. If it is after clinic hours your family physician or the after hours number for the clinic or go to the Emergency Department.   BELOW ARE SYMPTOMS THAT SHOULD BE REPORTED IMMEDIATELY:  *FEVER GREATER THAN 100.5 F  *CHILLS WITH OR WITHOUT FEVER  NAUSEA AND VOMITING THAT IS NOT CONTROLLED WITH YOUR NAUSEA MEDICATION  *UNUSUAL SHORTNESS OF BREATH  *UNUSUAL BRUISING OR BLEEDING  TENDERNESS IN MOUTH AND THROAT WITH OR WITHOUT PRESENCE OF ULCERS  *URINARY PROBLEMS  *BOWEL PROBLEMS  UNUSUAL RASH Items with * indicate a potential emergency and should be followed up as soon as possible.  One of the nurses will contact you 24 hours after your treatment. Please let the nurse know about any problems that you may have experienced. Feel free to call the clinic you have any questions or concerns. The clinic phone number is [phone].   I have been informed and understand all the instructions given to me. I know to contact the clinic, my physician, or go to the Emergency Department if any problems should occur. I do not have any questions at this time, but understand that I may call the clinic during office hours   should I have any questions or need assistance in obtaining follow up care.    __________________________________________  _____________  __________ Signature of Patient or Authorized Representative            Date                   Time    __________________________________________ Nurse's Signature    

## 2011-10-23 NOTE — Telephone Encounter (Signed)
Emailed michelle to move treatment to the morning time had to added on lab and midlevel appointment

## 2011-10-23 NOTE — Progress Notes (Signed)
Hematology and Oncology Follow Up Visit  Mirha Brucato [id number] 1950/10/29 61 y.o. 10/23/2011    HPI: Ms. 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 She is currently week 2 cycle 3/6 planned every 3 week TCH with weekly Herceptin. Neulasta is utilize for granulocyte support on day 2.  Interim History:   Sadeen is seen today for followup after her third cycle of every 3 week TCH. She is due for Herceptin today. She actually feels quite well overall denying any unexplained fevers, chills, or night sweats. She did receive Neulasta on day 2, she denies a diffuse bony discomfort. No shortness of breath or chest pain. She has not been troubled with nausea, or emesis. She had a small amount of constipation which has resolved. She is staying well hydrated. Her only complaint is the noted mild "chemo brain". 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 No nailbed dyscrasia, but she has noticed mild "tingling" of her fingertips and soles of her feet.  This sensation does not affect her ADLs. A detailed review of systems is otherwise noncontributory as noted below.  Review of Systems: Constitutional:  no weight loss, fever, night sweats, feels well and fatigued Eyes: uses glasses ENT: no complaints Cardiovascular: no chest pain or dyspnea on exertion Respiratory: no cough, shortness of breath, or wheezing Neurological: no TIA or stroke symptoms Dermatological: negative Gastrointestinal: no abdominal pain, change in bowel habits, or black or bloody stools Genito-Urinary: no dysuria, trouble voiding, or hematuria Hematological and Lymphatic: negative Breast: negative Musculoskeletal:  negative Remaining ROS negative.  Medications:   I have reviewed the patient's current medications.  Current Outpatient Prescriptions  Medication Sig Dispense Refill  . aspirin-acetaminophen-caffeine (EXCEDRIN MIGRAINE) 250-250-65 MG per tablet Take 2 tablets by mouth as needed.      Marland Kitchen dexamethasone (DECADRON) 4 MG tablet Take 2 tablets two times a day the day before Taxotere. Then take 2 tabs two times a day starting the day after chemo for 3 days.  30 tablet  1  . loratadine (CLARITIN) 10 MG tablet Take 10 mg by mouth daily.      Marland Kitchen LORazepam (ATIVAN) 0.5 MG tablet Take 1 tablet (0.5 mg total) by mouth every 6 (six) hours as needed (Nausea or vomiting).  30 tablet  0  . ondansetron (ZOFRAN) 8 MG tablet Take 1 tablet two times a day starting the day after chemo for 3 days. Then take 1 tab two times a day as needed for nausea or vomiting.  30 tablet  1  . prochlorperazine (COMPAZINE) 10 MG tablet Take 1 tablet (10 mg total) by mouth every 6 (six) hours as needed (Nausea or vomiting).  30 tablet  1  . prochlorperazine (COMPAZINE) 25 MG suppository Place 1 suppository (25 mg total) rectally every 12 (twelve) hours as needed for nausea.  12 suppository  3    Allergies: No Known Allergies  Physical Exam: Filed Vitals:   10/23/11 0909  BP: 119/72  Pulse: 106  Temp: 98.5 F (36.9 C)    Body mass index is 29.03 kg/(m^2). Weight: 169 lbs. HEENT:  Sclerae anicteric, conjunctivae pink.  Oropharynx clear.  No mucositis or candidiasis.   Nodes:  No cervical, supraclavicular, or axillary lymphadenopathy palpated.  Breast Exam:  Deferred. Lungs:  Clear to auscultation bilaterally.  No crackles, rhonchi, or wheezes.   Heart:  Regular rate and rhythm.   Abdomen:  Soft, nontender.  Positive bowel sounds.  No organomegaly or masses palpated.   Musculoskeletal:  No focal spinal tenderness to palpation.  Extremities:  Benign.  No peripheral edema or cyanosis.   Skin:  Benign.   Neuro:  Nonfocal,  alert and oriented x 3.  Lab Results: Lab Results  Component Value Date   WBC 8.2 10/23/2011   HGB 11.5* 10/23/2011   HCT 33.7* 10/23/2011   MCV 92.1 10/23/2011   PLT 203 10/23/2011   NEUTROABS 3.8 10/23/2011     Chemistry      Component Value Date/Time   NA 139 10/16/2011 0900   K 4.1 10/16/2011 0900   CL 103 10/16/2011 0900   CO2 23 10/16/2011 0900   BUN 17 10/16/2011 0900   CREATININE 0.82 10/16/2011 0900      Component Value Date/Time   CALCIUM 9.6 10/16/2011 0900   ALKPHOS 97 10/16/2011 0900   AST 18 10/16/2011 0900   ALT 30 10/16/2011 0900   BILITOT 0.4 10/16/2011 0900      Lab Results  Component Value Date   LABCA2 33 07/30/2011    Assessment:  Ms. 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 She is currently week 2 cycle 3/6 planned every 3 week TCH with weekly Herceptin. Neulasta is utilize for granulocyte support on day 2.  2. Mild peripheral neuropathy symptoms.  Case reviewed with Dr. Donnie Coffin.   Plan:  Willye will receive week 2 cycle 3 of every 3 week TCH today as scheduled which is single agent Herceptin. Her Benadryl has been decreased to 12.5 mg. She will return in one week's time for labs and week 3 cycle 3 which again is single agent Herceptin, next follow up exam on 11/06/11 correlating with week 1 cycle 4 of every 3 week TCH.  Pertaining to mild neuropathy symptoms, she will start a vitamin B complex daily. This plan was reviewed with the patient, who voices understanding and agreement.  She knows to call with any changes or problems.    Kiante Petrovich T, PA-C 10/23/2011

## 2011-10-28 ENCOUNTER — Ambulatory Visit (INDEPENDENT_AMBULATORY_CARE_PROVIDER_SITE_OTHER): Payer: Medicaid Other | Admitting: Surgery

## 2011-10-28 ENCOUNTER — Encounter (INDEPENDENT_AMBULATORY_CARE_PROVIDER_SITE_OTHER): Payer: Self-pay | Admitting: Surgery

## 2011-10-28 VITALS — BP 130/78 | HR 98 | Temp 97.6°F | Resp 16 | Ht 64.0 in | Wt 171.6 lb

## 2011-10-28 DIAGNOSIS — C50419 Malignant neoplasm of upper-outer quadrant of unspecified female breast: Secondary | ICD-10-CM

## 2011-10-28 NOTE — Patient Instructions (Addendum)
See me in about 3 or 4 months, after you have completed your radiation therapy.

## 2011-10-28 NOTE — Progress Notes (Signed)
Rachael Weaver    [id number] 10/28/2011    1951-01-22   CC: Post op lumpectomy, sln and PAC  HPI: The patient returns for post op follow-up. She underwent a right lumpectomy, SLN and PAC placement on 08/21/2011. Over all she feels that she is doing well.She has started chemo She has taken only Tylenol or advil for pain  PE: The incision is healing nicely and there is no evidence of infection or hematoma.   DATA REVIEWED: Pathology report showed 3.1 cm IDC, negative margins and 0/3 involved LN's  IMPRESSION: Patient doing well.   PLAN: Her next visit will be in 3 or 4 months, when she completed both chemotherapy and radiation therapy.

## 2011-10-31 ENCOUNTER — Other Ambulatory Visit (HOSPITAL_BASED_OUTPATIENT_CLINIC_OR_DEPARTMENT_OTHER): Payer: Medicaid Other | Admitting: Lab

## 2011-10-31 ENCOUNTER — Ambulatory Visit (HOSPITAL_BASED_OUTPATIENT_CLINIC_OR_DEPARTMENT_OTHER): Payer: Medicaid Other

## 2011-10-31 VITALS — BP 124/65 | HR 83 | Temp 98.3°F

## 2011-10-31 DIAGNOSIS — Z5112 Encounter for antineoplastic immunotherapy: Secondary | ICD-10-CM

## 2011-10-31 DIAGNOSIS — C50419 Malignant neoplasm of upper-outer quadrant of unspecified female breast: Secondary | ICD-10-CM

## 2011-10-31 DIAGNOSIS — C50919 Malignant neoplasm of unspecified site of unspecified female breast: Secondary | ICD-10-CM

## 2011-10-31 DIAGNOSIS — Z170 Estrogen receptor positive status [ER+]: Secondary | ICD-10-CM

## 2011-10-31 LAB — CBC WITH DIFFERENTIAL/PLATELET
Basophils Absolute: 0 10*3/uL (ref 0.0–0.1)
EOS%: 0.2 % (ref 0.0–7.0)
Eosinophils Absolute: 0 10*3/uL (ref 0.0–0.5)
HCT: 33.3 % — ABNORMAL LOW (ref 34.8–46.6)
HGB: 11.2 g/dL — ABNORMAL LOW (ref 11.6–15.9)
MCH: 31.2 pg (ref 25.1–34.0)
NEUT#: 4.8 10*3/uL (ref 1.5–6.5)
NEUT%: 57.2 % (ref 38.4–76.8)
lymph#: 3.1 10*3/uL (ref 0.9–3.3)

## 2011-10-31 MED ORDER — SODIUM CHLORIDE 0.9 % IJ SOLN
10.0000 mL | INTRAMUSCULAR | Status: DC | PRN
  Administered 2011-10-31: 10 mL
  Filled 2011-10-31: qty 10

## 2011-10-31 MED ORDER — TRASTUZUMAB CHEMO INJECTION 440 MG
2.0000 mg/kg | Freq: Once | INTRAVENOUS | Status: AC
  Administered 2011-10-31: 147 mg via INTRAVENOUS
  Filled 2011-10-31: qty 7

## 2011-10-31 MED ORDER — HEPARIN SOD (PORK) LOCK FLUSH 100 UNIT/ML IV SOLN
500.0000 [IU] | Freq: Once | INTRAVENOUS | Status: AC | PRN
  Administered 2011-10-31: 500 [IU]
  Filled 2011-10-31: qty 5

## 2011-10-31 MED ORDER — SODIUM CHLORIDE 0.9 % IV SOLN
Freq: Once | INTRAVENOUS | Status: AC
  Administered 2011-10-31: 10:00:00 via INTRAVENOUS

## 2011-10-31 MED ORDER — DIPHENHYDRAMINE HCL 50 MG/ML IJ SOLN
12.5000 mg | Freq: Once | INTRAMUSCULAR | Status: AC
  Administered 2011-10-31: 12.5 mg via INTRAVENOUS

## 2011-10-31 MED ORDER — ACETAMINOPHEN 325 MG PO TABS
650.0000 mg | ORAL_TABLET | Freq: Once | ORAL | Status: AC
  Administered 2011-10-31: 650 mg via ORAL

## 2011-10-31 NOTE — Patient Instructions (Signed)
Call MD for problems 

## 2011-11-06 ENCOUNTER — Other Ambulatory Visit (HOSPITAL_BASED_OUTPATIENT_CLINIC_OR_DEPARTMENT_OTHER): Payer: Medicaid Other | Admitting: Lab

## 2011-11-06 ENCOUNTER — Encounter: Payer: Self-pay | Admitting: Physician Assistant

## 2011-11-06 ENCOUNTER — Ambulatory Visit (HOSPITAL_BASED_OUTPATIENT_CLINIC_OR_DEPARTMENT_OTHER): Payer: Medicaid Other

## 2011-11-06 ENCOUNTER — Telehealth: Payer: Self-pay | Admitting: Oncology

## 2011-11-06 ENCOUNTER — Telehealth: Payer: Self-pay | Admitting: *Deleted

## 2011-11-06 ENCOUNTER — Ambulatory Visit (HOSPITAL_BASED_OUTPATIENT_CLINIC_OR_DEPARTMENT_OTHER): Payer: Medicaid Other | Admitting: Physician Assistant

## 2011-11-06 VITALS — BP 134/82 | HR 109 | Temp 98.8°F | Ht 64.0 in | Wt 171.0 lb

## 2011-11-06 DIAGNOSIS — Z5111 Encounter for antineoplastic chemotherapy: Secondary | ICD-10-CM

## 2011-11-06 DIAGNOSIS — Z5112 Encounter for antineoplastic immunotherapy: Secondary | ICD-10-CM

## 2011-11-06 DIAGNOSIS — C50919 Malignant neoplasm of unspecified site of unspecified female breast: Secondary | ICD-10-CM

## 2011-11-06 DIAGNOSIS — C50419 Malignant neoplasm of upper-outer quadrant of unspecified female breast: Secondary | ICD-10-CM

## 2011-11-06 DIAGNOSIS — Z170 Estrogen receptor positive status [ER+]: Secondary | ICD-10-CM

## 2011-11-06 DIAGNOSIS — R12 Heartburn: Secondary | ICD-10-CM

## 2011-11-06 LAB — CBC WITH DIFFERENTIAL/PLATELET
BASO%: 0.2 % (ref 0.0–2.0)
LYMPH%: 14 % (ref 14.0–49.7)
MCHC: 33.6 g/dL (ref 31.5–36.0)
MONO#: 0.5 10*3/uL (ref 0.1–0.9)
MONO%: 5.7 % (ref 0.0–14.0)
NEUT#: 6.4 10*3/uL (ref 1.5–6.5)
Platelets: 240 10*3/uL (ref 145–400)
RBC: 3.54 10*6/uL — ABNORMAL LOW (ref 3.70–5.45)
RDW: 17.2 % — ABNORMAL HIGH (ref 11.2–14.5)
WBC: 8.1 10*3/uL (ref 3.9–10.3)
nRBC: 0 % (ref 0–0)

## 2011-11-06 LAB — COMPREHENSIVE METABOLIC PANEL
ALT: 29 U/L (ref 0–35)
AST: 19 U/L (ref 0–37)
Chloride: 105 mEq/L (ref 96–112)
Creatinine, Ser: 0.72 mg/dL (ref 0.50–1.10)
Sodium: 139 mEq/L (ref 135–145)
Total Bilirubin: 0.4 mg/dL (ref 0.3–1.2)
Total Protein: 6.6 g/dL (ref 6.0–8.3)

## 2011-11-06 MED ORDER — DOCETAXEL CHEMO INJECTION 160 MG/16ML
75.0000 mg/m2 | Freq: Once | INTRAVENOUS | Status: AC
  Administered 2011-11-06: 140 mg via INTRAVENOUS
  Filled 2011-11-06: qty 14

## 2011-11-06 MED ORDER — HEPARIN SOD (PORK) LOCK FLUSH 100 UNIT/ML IV SOLN
500.0000 [IU] | Freq: Once | INTRAVENOUS | Status: AC | PRN
  Administered 2011-11-06: 500 [IU]
  Filled 2011-11-06: qty 5

## 2011-11-06 MED ORDER — ACETAMINOPHEN 325 MG PO TABS
650.0000 mg | ORAL_TABLET | Freq: Once | ORAL | Status: AC
  Administered 2011-11-06: 650 mg via ORAL

## 2011-11-06 MED ORDER — SODIUM CHLORIDE 0.9 % IV SOLN
645.0000 mg | Freq: Once | INTRAVENOUS | Status: AC
  Administered 2011-11-06: 650 mg via INTRAVENOUS
  Filled 2011-11-06: qty 65

## 2011-11-06 MED ORDER — DIPHENHYDRAMINE HCL 50 MG/ML IJ SOLN
12.5000 mg | Freq: Once | INTRAMUSCULAR | Status: AC
  Administered 2011-11-06: 12.5 mg via INTRAVENOUS

## 2011-11-06 MED ORDER — ONDANSETRON 16 MG/50ML IVPB (CHCC)
16.0000 mg | Freq: Once | INTRAVENOUS | Status: AC
  Administered 2011-11-06: 16 mg via INTRAVENOUS

## 2011-11-06 MED ORDER — SODIUM CHLORIDE 0.9 % IV SOLN
Freq: Once | INTRAVENOUS | Status: AC
  Administered 2011-11-06: 11:00:00 via INTRAVENOUS

## 2011-11-06 MED ORDER — DEXAMETHASONE SODIUM PHOSPHATE 4 MG/ML IJ SOLN
20.0000 mg | Freq: Once | INTRAMUSCULAR | Status: AC
  Administered 2011-11-06: 20 mg via INTRAVENOUS

## 2011-11-06 MED ORDER — SODIUM CHLORIDE 0.9 % IJ SOLN
10.0000 mL | INTRAMUSCULAR | Status: DC | PRN
  Administered 2011-11-06: 10 mL
  Filled 2011-11-06: qty 10

## 2011-11-06 MED ORDER — TRASTUZUMAB CHEMO INJECTION 440 MG
2.0000 mg/kg | Freq: Once | INTRAVENOUS | Status: AC
  Administered 2011-11-06: 147 mg via INTRAVENOUS
  Filled 2011-11-06: qty 7

## 2011-11-06 NOTE — Telephone Encounter (Signed)
The pt is aware to pick up her aug 2013 appt calendar along with the echo appt when she comes in the office tomorrow. Sent Rachael Weaver a staff message

## 2011-11-06 NOTE — Progress Notes (Signed)
Hematology and Oncology Follow Up Visit  Rachael Weaver [id number] 1950-05-10 61 y.o. 11/06/2011    HPI: Ms. 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 She is currently day 1 cycle 4/6 planned every 3 week TCH with weekly Herceptin. Neulasta is utilize for granulocyte support on day 2.  Interim History:   Rachael Weaver is seen today prior to week 1 cycle 4 of 6 planned Taxotere/carboplatin and Herceptin. Again, Neulasta support on day 2. She is feeling well, denying any unexplained fevers, chills, or night sweats. She does have some easy fatigability with exertion, but denies frank shortness of breath otherwise, no cough.  She denies any nausea, emesis, diarrhea, or constipation. She denies any frank mouth sores. No nailbed sensitivity or neuropathy. No rashes.  She has noted some increased heartburn symptoms for which Zantac has been on effective at this point, she  Has tried Prevacid 15 mg which has been quite effective, therefore prescription will be given today. A detailed review of systems is otherwise noncontributory as noted below.  Review of Systems: Constitutional:  no weight loss, fever, night sweats, feels well and fatigued Eyes: uses glasses ENT: no complaints Cardiovascular: no chest pain or dyspnea on exertion Respiratory: no cough, shortness of breath, or wheezing Neurological: no TIA or stroke symptoms Dermatological: negative Gastrointestinal: no abdominal pain, change in bowel habits, or black or bloody stools Genito-Urinary: no dysuria, trouble voiding, or hematuria Hematological and Lymphatic: negative Breast: negative Musculoskeletal: negative Remaining ROS negative.  Medications:     I have reviewed the patient's current medications.  Current Outpatient Prescriptions  Medication Sig Dispense Refill  . aspirin-acetaminophen-caffeine (EXCEDRIN MIGRAINE) 250-250-65 MG per tablet Take 2 tablets by mouth as needed.      Marland Kitchen dexamethasone (DECADRON) 4 MG tablet Take 2 tablets two times a day the day before Taxotere. Then take 2 tabs two times a day starting the day after chemo for 3 days.  30 tablet  1  . loratadine (CLARITIN) 10 MG tablet Take 10 mg by mouth daily.      Marland Kitchen LORazepam (ATIVAN) 0.5 MG tablet Take 1 tablet (0.5 mg total) by mouth every 6 (six) hours as needed (Nausea or vomiting).  30 tablet  0  . naproxen sodium (ANAPROX) 220 MG tablet Take 220 mg by mouth 2 (two) times daily with a meal.      . omeprazole (PRILOSEC) 10 MG capsule Take 10 mg by mouth daily.      . ondansetron (ZOFRAN) 8 MG tablet Take 1 tablet two times a day starting the day after chemo for 3 days. Then take 1 tab two times a day as needed for nausea or vomiting.  30 tablet  1  . prochlorperazine (COMPAZINE) 10 MG tablet Take 1 tablet (10 mg total) by mouth every 6 (six) hours as needed (Nausea or vomiting).  30 tablet  1  . prochlorperazine (COMPAZINE) 25 MG suppository Place 1 suppository (25 mg total) rectally every 12 (twelve) hours as needed for nausea.  12 suppository  3    Allergies: No Known Allergies  Physical Exam: Filed Vitals:   11/06/11 0944  BP: 134/82  Pulse: 109  Temp: 98.8 F (37.1 C)    Body mass index is 29.35 kg/(m^2).  Weight: 171 lbs. HEENT:  Sclerae anicteric, conjunctivae pink.  Oropharynx clear.  No mucositis or candidiasis.   Nodes:  No cervical, supraclavicular, or axillary lymphadenopathy palpated.  Breast Exam:  Deferred. Lungs:  Clear to auscultation bilaterally.  No crackles, rhonchi, or wheezes.   Heart:  Regular rate and rhythm.   Abdomen:  Soft, nontender.  Positive bowel sounds.  No organomegaly or masses palpated.   Musculoskeletal:  No focal spinal  tenderness to palpation.  Extremities:  Benign.  No peripheral edema or cyanosis.   Skin:  Benign.   Neuro:  Nonfocal, alert and oriented x 3.  Lab Results: Lab Results  Component Value Date   WBC 8.1 11/06/2011   HGB 11.2* 11/06/2011   HCT 33.3* 11/06/2011   MCV 94.1 11/06/2011   PLT 240 11/06/2011   NEUTROABS 6.4 11/06/2011     Chemistry      Component Value Date/Time   NA 139 10/16/2011 0900   K 4.1 10/16/2011 0900   CL 103 10/16/2011 0900   CO2 23 10/16/2011 0900   BUN 17 10/16/2011 0900   CREATININE 0.82 10/16/2011 0900      Component Value Date/Time   CALCIUM 9.6 10/16/2011 0900   ALKPHOS 97 10/16/2011 0900   AST 18 10/16/2011 0900   ALT 30 10/16/2011 0900   BILITOT 0.4 10/16/2011 0900      Lab Results  Component Value Date   LABCA2 33 07/30/2011    Assessment:  Ms. 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 She is currently day 1 cycle 4/6 planned every 3 week TCH with weekly Herceptin. Neulasta is utilize for granulocyte support on day 2.  Case reviewed with Dr. Pierce Crane.  Plan: Rachael Weaver will receive week 1 cycle 4 of 6 planned every 3 week Taxotere/carboplatin and Herceptin today as scheduled. I have provided her prescription of Prevacid 15 mg tablets to be taken 1 per day may increase to 2 per day. She'll return on day 2 for Neulasta support. We will see her back in one week's time prior to week 2 which will be Herceptin alone. She knows to contact us sooner if the need should arise.  Trista Ciocca T, PA-C 11/06/2011

## 2011-11-06 NOTE — Telephone Encounter (Signed)
Per staff message and POF I have scheduled appts.  JMW  

## 2011-11-06 NOTE — Patient Instructions (Addendum)
[facility] Discharge Instructions for Patients Receiving Chemotherapy  Today you received the following chemotherapy agents:  Taxotere, Carboplatin and Herceptin  To help prevent nausea and vomiting after your treatment, we encourage you to take your nausea medication as ordered per MD.    If you develop nausea and vomiting that is not controlled by your nausea medication, call the clinic. If it is after clinic hours your family physician or the after hours number for the clinic or go to the Emergency Department.   BELOW ARE SYMPTOMS THAT SHOULD BE REPORTED IMMEDIATELY:  *FEVER GREATER THAN 100.5 F  *CHILLS WITH OR WITHOUT FEVER  NAUSEA AND VOMITING THAT IS NOT CONTROLLED WITH YOUR NAUSEA MEDICATION  *UNUSUAL SHORTNESS OF BREATH  *UNUSUAL BRUISING OR BLEEDING  TENDERNESS IN MOUTH AND THROAT WITH OR WITHOUT PRESENCE OF ULCERS  *URINARY PROBLEMS  *BOWEL PROBLEMS  UNUSUAL RASH Items with * indicate a potential emergency and should be followed up as soon as possible.   Please let the nurse know about any problems that you may have experienced. Feel free to call the clinic you have any questions or concerns. The clinic phone number is [phone].   I have been informed and understand all the instructions given to me. I know to contact the clinic, my physician, or go to the Emergency Department if any problems should occur. I do not have any questions at this time, but understand that I may call the clinic during office hours   should I have any questions or need assistance in obtaining follow up care.    __________________________________________  _____________  __________ Signature of Patient or Authorized Representative            Date                   Time    __________________________________________ Nurse's Signature    

## 2011-11-07 ENCOUNTER — Ambulatory Visit (HOSPITAL_BASED_OUTPATIENT_CLINIC_OR_DEPARTMENT_OTHER): Payer: Medicaid Other

## 2011-11-07 VITALS — BP 140/87 | HR 87 | Temp 97.5°F

## 2011-11-07 DIAGNOSIS — C50919 Malignant neoplasm of unspecified site of unspecified female breast: Secondary | ICD-10-CM

## 2011-11-07 DIAGNOSIS — C50419 Malignant neoplasm of upper-outer quadrant of unspecified female breast: Secondary | ICD-10-CM

## 2011-11-07 MED ORDER — PEGFILGRASTIM INJECTION 6 MG/0.6ML
6.0000 mg | Freq: Once | SUBCUTANEOUS | Status: AC
  Administered 2011-11-07: 6 mg via SUBCUTANEOUS
  Filled 2011-11-07: qty 0.6

## 2011-11-10 ENCOUNTER — Telehealth: Payer: Self-pay | Admitting: Oncology

## 2011-11-10 NOTE — Telephone Encounter (Signed)
lmonvm adviising the pt of her echo appt on 11/19/2011 at [facility]

## 2011-11-10 NOTE — Telephone Encounter (Signed)
lmonvm advising the pt of her echo appt in July at [facility]

## 2011-11-13 ENCOUNTER — Ambulatory Visit (HOSPITAL_BASED_OUTPATIENT_CLINIC_OR_DEPARTMENT_OTHER): Payer: Medicaid Other

## 2011-11-13 ENCOUNTER — Other Ambulatory Visit (HOSPITAL_BASED_OUTPATIENT_CLINIC_OR_DEPARTMENT_OTHER): Payer: Medicaid Other | Admitting: Lab

## 2011-11-13 ENCOUNTER — Ambulatory Visit (HOSPITAL_BASED_OUTPATIENT_CLINIC_OR_DEPARTMENT_OTHER): Payer: Medicaid Other | Admitting: Physician Assistant

## 2011-11-13 VITALS — BP 134/74 | HR 103 | Temp 99.0°F | Ht 64.0 in | Wt 170.9 lb

## 2011-11-13 DIAGNOSIS — G609 Hereditary and idiopathic neuropathy, unspecified: Secondary | ICD-10-CM

## 2011-11-13 DIAGNOSIS — C50919 Malignant neoplasm of unspecified site of unspecified female breast: Secondary | ICD-10-CM

## 2011-11-13 DIAGNOSIS — C50419 Malignant neoplasm of upper-outer quadrant of unspecified female breast: Secondary | ICD-10-CM

## 2011-11-13 DIAGNOSIS — Z5112 Encounter for antineoplastic immunotherapy: Secondary | ICD-10-CM

## 2011-11-13 DIAGNOSIS — Z170 Estrogen receptor positive status [ER+]: Secondary | ICD-10-CM

## 2011-11-13 LAB — CBC WITH DIFFERENTIAL/PLATELET
BASO%: 0.5 % (ref 0.0–2.0)
Basophils Absolute: 0.1 10*3/uL (ref 0.0–0.1)
Eosinophils Absolute: 0 10*3/uL (ref 0.0–0.5)
HCT: 32.5 % — ABNORMAL LOW (ref 34.8–46.6)
HGB: 11 g/dL — ABNORMAL LOW (ref 11.6–15.9)
LYMPH%: 30.3 % (ref 14.0–49.7)
MONO#: 2 10*3/uL — ABNORMAL HIGH (ref 0.1–0.9)
MONO%: 16.8 % — ABNORMAL HIGH (ref 0.0–14.0)
NEUT#: 6.2 10*3/uL (ref 1.5–6.5)
NEUT%: 52.3 % (ref 38.4–76.8)
RDW: 16.1 % — ABNORMAL HIGH (ref 11.2–14.5)
WBC: 11.8 10*3/uL — ABNORMAL HIGH (ref 3.9–10.3)
lymph#: 3.6 10*3/uL — ABNORMAL HIGH (ref 0.9–3.3)

## 2011-11-13 MED ORDER — DIPHENHYDRAMINE HCL 50 MG/ML IJ SOLN
12.5000 mg | Freq: Once | INTRAMUSCULAR | Status: AC
  Administered 2011-11-13: 12.5 mg via INTRAVENOUS

## 2011-11-13 MED ORDER — SODIUM CHLORIDE 0.9 % IV SOLN
2.0000 mg/kg | Freq: Once | INTRAVENOUS | Status: AC
  Administered 2011-11-13: 147 mg via INTRAVENOUS
  Filled 2011-11-13: qty 7

## 2011-11-13 MED ORDER — HEPARIN SOD (PORK) LOCK FLUSH 100 UNIT/ML IV SOLN
500.0000 [IU] | Freq: Once | INTRAVENOUS | Status: AC | PRN
  Administered 2011-11-13: 500 [IU]
  Filled 2011-11-13: qty 5

## 2011-11-13 MED ORDER — SODIUM CHLORIDE 0.9 % IJ SOLN
10.0000 mL | INTRAMUSCULAR | Status: DC | PRN
  Administered 2011-11-13: 10 mL
  Filled 2011-11-13: qty 10

## 2011-11-13 MED ORDER — ACETAMINOPHEN 325 MG PO TABS
650.0000 mg | ORAL_TABLET | Freq: Once | ORAL | Status: AC
  Administered 2011-11-13: 650 mg via ORAL

## 2011-11-13 MED ORDER — SODIUM CHLORIDE 0.9 % IV SOLN
Freq: Once | INTRAVENOUS | Status: AC
  Administered 2011-11-13: 10:00:00 via INTRAVENOUS

## 2011-11-13 NOTE — Progress Notes (Signed)
Hematology and Oncology Follow Up Visit  Rachael Weaver [id number] Dec 12, 1950 61 y.o. 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 She is currently week 2 cycle 4/6 planned every 3 week TCH with weekly Herceptin. Neulasta is utilize for granulocyte support on day 2.  Interim History:   Rachael Weaver is seen today for followup after her fourth cycle of every 3 week TCH. She is due for Herceptin today. She actually feels quite well overall denying any unexplained fevers, chills, or night sweats. She did receive Neulasta on day 2, she denies a diffuse bony discomfort. No shortness of breath or chest pain. She has not been troubled with nausea, or emesis. She is staying well hydrated. Her only complaint is the noted mild "chemo brain", and increased fatigue, but feels this is to be expected. 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 No nailbed dyscrasia, but she has noticed mild "tingling" of her fingertips and soles of her feet.  This sensation does not affect her ADLs. A detailed review of systems is otherwise noncontributory as noted below.  Review of Systems: Constitutional:  no weight loss, fever, night sweats, feels well and fatigued Eyes: uses glasses ENT: no complaints Cardiovascular: no chest pain or dyspnea on exertion Respiratory: no cough, shortness of breath, or wheezing Neurological: no TIA or stroke symptoms Dermatological: negative Gastrointestinal: no abdominal pain, change in bowel habits, or black or bloody stools Genito-Urinary: no dysuria, trouble voiding, or hematuria Hematological and Lymphatic: negative Breast: negative Musculoskeletal:  negative Remaining ROS negative.  Medications:   I have reviewed the patient's current medications.  Current Outpatient Prescriptions  Medication Sig Dispense Refill  . aspirin-acetaminophen-caffeine (EXCEDRIN MIGRAINE) 250-250-65 MG per tablet Take 2 tablets by mouth as needed.      Marland Kitchen dexamethasone (DECADRON) 4 MG tablet Take 2 tablets two times a day the day before Taxotere. Then take 2 tabs two times a day starting the day after chemo for 3 days.  30 tablet  1  . lansoprazole (PREVACID) 15 MG capsule Take 15 mg by mouth daily.      Marland Kitchen loratadine (CLARITIN) 10 MG tablet Take 10 mg by mouth daily.      Marland Kitchen LORazepam (ATIVAN) 0.5 MG tablet Take 1 tablet (0.5 mg total) by mouth every 6 (six) hours as needed (Nausea or vomiting).  30 tablet  0  . naproxen sodium (ANAPROX) 220 MG tablet Take 220 mg by mouth 2 (two) times daily with a meal.      . omeprazole (PRILOSEC) 10 MG capsule Take 10 mg by mouth daily.      . ondansetron (ZOFRAN) 8 MG tablet Take 1 tablet two times a day starting the day after chemo for 3 days. Then take 1 tab two times a day as needed for nausea or vomiting.  30 tablet  1  . prochlorperazine (COMPAZINE) 10 MG tablet Take 1 tablet (10 mg total) by mouth every 6 (six) hours as needed (Nausea or vomiting).  30 tablet  1  . prochlorperazine (COMPAZINE) 25 MG suppository Place 1 suppository (25 mg total) rectally every 12 (twelve) hours as needed for nausea.  12 suppository  3    Allergies: No Known Allergies  Physical Exam: Filed Vitals:   11/13/11 0915  BP: 134/74  Pulse: 103  Temp: 99 F (37.2 C)    Body mass index is 29.34 kg/(m^2). Weight: 170 lbs. HEENT:  Sclerae anicteric, conjunctivae pink.  Oropharynx clear.  No mucositis or candidiasis.   Nodes:  No cervical, supraclavicular, or axillary lymphadenopathy palpated.  Breast Exam:  Deferred. Lungs:  Clear to auscultation bilaterally.  No crackles, rhonchi, or wheezes.   Heart:  Regular rate and rhythm.    Abdomen:  Soft, nontender.  Positive bowel sounds.  No organomegaly or masses palpated.   Musculoskeletal:  No focal spinal tenderness to palpation.  Extremities:  Benign.  No peripheral edema or cyanosis.   Skin:  Benign.   Neuro:  Nonfocal, alert and oriented x 3.  Lab Results: Lab Results  Component Value Date   WBC 11.8* 11/13/2011   HGB 11.0* 11/13/2011   HCT 32.5* 11/13/2011   MCV 93.9 11/13/2011   PLT 276 11/13/2011   NEUTROABS 6.2 11/13/2011     Chemistry      Component Value Date/Time   NA 139 11/06/2011 0934   K 3.9 11/06/2011 0934   CL 105 11/06/2011 0934   CO2 25 11/06/2011 0934   BUN 15 11/06/2011 0934   CREATININE 0.72 11/06/2011 0934      Component Value Date/Time   CALCIUM 9.1 11/06/2011 0934   ALKPHOS 96 11/06/2011 0934   AST 19 11/06/2011 0934   ALT 29 11/06/2011 0934   BILITOT 0.4 11/06/2011 0934      Lab Results  Component Value Date   LABCA2 33 07/30/2011    Assessment:  Rachael Weaver is a 61 year old [place] woman with a 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 She is currently week 2 cycle 4/6 planned every 3 week TCH with weekly Herceptin. Neulasta is utilize for granulocyte support on day 2.  2. Mild peripheral neuropathy symptoms.  Case reviewed with Dr. Donnie Coffin.   Plan:  Rachael Weaver will receive week 2 cycle 3 of every 3 week TCH today as scheduled which is single agent Herceptin. Her Benadryl has been decreased to 12.5 mg. She will return in one week's time for labs and week 3 cycle 3 which again is single agent Herceptin, next follow up exam on 11/27/11 correlating with week 1 cycle 5 of every 3 week TCH.   This plan was reviewed with the patient, who voices understanding and agreement.  She knows to call with  any changes or problems.    Arieh Bogue T, PA-C 11/13/2011

## 2011-11-13 NOTE — Patient Instructions (Addendum)
[facility] Discharge Instructions for Patients Receiving Chemotherapy  Today you received the following chemotherapy agents Herceptin To help prevent nausea and vomiting after your treatment, we encourage you to take your nausea medication as prescribed.  If you develop nausea and vomiting that is not controlled by your nausea medication, call the clinic. If it is after clinic hours your family physician or the after hours number for the clinic or go to the Emergency Department.   BELOW ARE SYMPTOMS THAT SHOULD BE REPORTED IMMEDIATELY:  *FEVER GREATER THAN 100.5 F  *CHILLS WITH OR WITHOUT FEVER  NAUSEA AND VOMITING THAT IS NOT CONTROLLED WITH YOUR NAUSEA MEDICATION  *UNUSUAL SHORTNESS OF BREATH  *UNUSUAL BRUISING OR BLEEDING  TENDERNESS IN MOUTH AND THROAT WITH OR WITHOUT PRESENCE OF ULCERS  *URINARY PROBLEMS  *BOWEL PROBLEMS  UNUSUAL RASH Items with * indicate a potential emergency and should be followed up as soon as possible.  One of the nurses will contact you 24 hours after your treatment. Please let the nurse know about any problems that you may have experienced. Feel free to call the clinic you have any questions or concerns. The clinic phone number is [phone].   I have been informed and understand all the instructions given to me. I know to contact the clinic, my physician, or go to the Emergency Department if any problems should occur. I do not have any questions at this time, but understand that I may call the clinic during office hours   should I have any questions or need assistance in obtaining follow up care.    __________________________________________  _____________  __________ Signature of Patient or Authorized Representative            Date                   Time    __________________________________________ Nurse's Signature    

## 2011-11-19 ENCOUNTER — Ambulatory Visit (HOSPITAL_COMMUNITY)
Admission: RE | Admit: 2011-11-19 | Discharge: 2011-11-19 | Disposition: A | Discharge status: Home / Self Care | Payer: Medicaid Other | Source: Ambulatory Visit | Attending: Oncology | Admitting: Oncology

## 2011-11-19 DIAGNOSIS — Z09 Encounter for follow-up examination after completed treatment for conditions other than malignant neoplasm: Secondary | ICD-10-CM | POA: Insufficient documentation

## 2011-11-19 DIAGNOSIS — C50419 Malignant neoplasm of upper-outer quadrant of unspecified female breast: Secondary | ICD-10-CM | POA: Insufficient documentation

## 2011-11-19 NOTE — Progress Notes (Signed)
  Echocardiogram 2D Echocardiogram has been performed.  Rachael Weaver 11/19/2011, 12:25 PM

## 2011-11-20 ENCOUNTER — Other Ambulatory Visit: Payer: Medicaid Other | Admitting: Lab

## 2011-11-20 ENCOUNTER — Other Ambulatory Visit: Payer: Self-pay | Admitting: Physician Assistant

## 2011-11-20 ENCOUNTER — Ambulatory Visit (HOSPITAL_BASED_OUTPATIENT_CLINIC_OR_DEPARTMENT_OTHER): Payer: Medicaid Other

## 2011-11-20 VITALS — BP 129/60 | HR 76 | Temp 98.5°F

## 2011-11-20 DIAGNOSIS — Z5112 Encounter for antineoplastic immunotherapy: Secondary | ICD-10-CM

## 2011-11-20 DIAGNOSIS — C50919 Malignant neoplasm of unspecified site of unspecified female breast: Secondary | ICD-10-CM

## 2011-11-20 DIAGNOSIS — C50419 Malignant neoplasm of upper-outer quadrant of unspecified female breast: Secondary | ICD-10-CM

## 2011-11-20 MED ORDER — TRASTUZUMAB CHEMO INJECTION 440 MG
2.0000 mg/kg | Freq: Once | INTRAVENOUS | Status: AC
  Administered 2011-11-20: 147 mg via INTRAVENOUS
  Filled 2011-11-20: qty 7

## 2011-11-20 MED ORDER — HEPARIN SOD (PORK) LOCK FLUSH 100 UNIT/ML IV SOLN
500.0000 [IU] | Freq: Once | INTRAVENOUS | Status: AC | PRN
  Administered 2011-11-20: 500 [IU]
  Filled 2011-11-20: qty 5

## 2011-11-20 MED ORDER — SODIUM CHLORIDE 0.9 % IJ SOLN
10.0000 mL | INTRAMUSCULAR | Status: DC | PRN
  Administered 2011-11-20: 10 mL
  Filled 2011-11-20: qty 10

## 2011-11-20 MED ORDER — SODIUM CHLORIDE 0.9 % IV SOLN
Freq: Once | INTRAVENOUS | Status: AC
  Administered 2011-11-20: 09:00:00 via INTRAVENOUS

## 2011-11-20 MED ORDER — DIPHENHYDRAMINE HCL 50 MG/ML IJ SOLN
12.5000 mg | Freq: Once | INTRAMUSCULAR | Status: AC
  Administered 2011-11-20: 12.5 mg via INTRAVENOUS

## 2011-11-20 MED ORDER — ACETAMINOPHEN 325 MG PO TABS
650.0000 mg | ORAL_TABLET | Freq: Once | ORAL | Status: AC
  Administered 2011-11-20: 650 mg via ORAL

## 2011-11-20 NOTE — Progress Notes (Signed)
Discharged at 10:50 am.  Ambulatory by herself.  [facility] right [facility].

## 2011-11-20 NOTE — Progress Notes (Signed)
Ms. Reach presented to the office today between schedule clinic visits, noting that she's had a "pulling" sensation in her right upper extremity,  present for about a week and half. Of note, she was seen on 11/13/2011 correlating with week 2 cycle 4 of 6 planned every 3 week TCH, she forgot to mention this symptom then. Upon brief physical exam, there is no evidence to suggest any frank cording, or increased vascularity. There's no erythema or obvious edema. Patient has excellent range of motion. Ms. Schreckengost knows that this is also her "dominant arm". She is continuing to work full-time. I will refer her to the [facility]ymphed[facility] for assessment and appropriate proactive measures. She was instructed to contact us  if her symptomatology should abruptly change, i.e. Erythema should  develop, increased edema, or pain. She is scheduled for followup with Dr. Donnie Coffin on 11/27/2011 correlating with week 1 cycle 5 of her planned Taxotere/carboplatin/Herceptin regimen.  C.T [facility] Madison Hights PA-C 11/20/11

## 2011-11-20 NOTE — Patient Instructions (Addendum)
[facility] Discharge Instructions for Patients Receiving Chemotherapy  Today you received the following chemotherapy agents Herceptin  To help prevent nausea and vomiting after your treatment, we encourage you to take your nausea medication Zofran, compazine or ativan Begin taking it at anytime upon discharge and take it as often as prescribed for the next 48 hours.   If you develop nausea and vomiting that is not controlled by your nausea medication, call the clinic. If it is after clinic hours your family physician or the after hours number for the clinic or go to the Emergency Department.   BELOW ARE SYMPTOMS THAT SHOULD BE REPORTED IMMEDIATELY:  *FEVER GREATER THAN 100.5 F  *CHILLS WITH OR WITHOUT FEVER  NAUSEA AND VOMITING THAT IS NOT CONTROLLED WITH YOUR NAUSEA MEDICATION  *UNUSUAL SHORTNESS OF BREATH  *UNUSUAL BRUISING OR BLEEDING  TENDERNESS IN MOUTH AND THROAT WITH OR WITHOUT PRESENCE OF ULCERS  *URINARY PROBLEMS  *BOWEL PROBLEMS  UNUSUAL RASH Items with * indicate a potential emergency and should be followed up as soon as possible.  Please call to let a nurse know about any problems that you may have experienced. Herceptin may cause flu-like symptoms and tylenol or benadryl may be taken as needed.  Feel free to call the clinic you have any questions or concerns. The clinic phone number is [phone].   I have been informed and understand all the instructions given to me. I know to contact the clinic, my physician, or go to the Emergency Department if any problems should occur. I do not have any questions at this time, but understand that I may call the clinic during office hours   should I have any questions or need assistance in obtaining follow up care.    __________________________________________  _____________  __________ Signature of Patient or Authorized Representative            Date                    Time    __________________________________________ Nurse's Signature

## 2011-11-26 ENCOUNTER — Other Ambulatory Visit: Payer: Self-pay | Admitting: Family

## 2011-11-26 DIAGNOSIS — C50919 Malignant neoplasm of unspecified site of unspecified female breast: Secondary | ICD-10-CM

## 2011-11-27 ENCOUNTER — Other Ambulatory Visit: Payer: Self-pay | Admitting: Emergency Medicine

## 2011-11-27 ENCOUNTER — Ambulatory Visit (HOSPITAL_BASED_OUTPATIENT_CLINIC_OR_DEPARTMENT_OTHER): Payer: Medicaid Other | Admitting: Lab

## 2011-11-27 ENCOUNTER — Ambulatory Visit (HOSPITAL_BASED_OUTPATIENT_CLINIC_OR_DEPARTMENT_OTHER): Payer: Medicaid Other | Admitting: Oncology

## 2011-11-27 ENCOUNTER — Encounter: Payer: Self-pay | Admitting: Pharmacist

## 2011-11-27 ENCOUNTER — Ambulatory Visit (HOSPITAL_BASED_OUTPATIENT_CLINIC_OR_DEPARTMENT_OTHER): Payer: Medicaid Other

## 2011-11-27 VITALS — BP 145/78 | HR 96 | Temp 98.4°F | Ht 64.0 in | Wt 175.1 lb

## 2011-11-27 DIAGNOSIS — C50919 Malignant neoplasm of unspecified site of unspecified female breast: Secondary | ICD-10-CM

## 2011-11-27 DIAGNOSIS — Z5112 Encounter for antineoplastic immunotherapy: Secondary | ICD-10-CM

## 2011-11-27 DIAGNOSIS — C50419 Malignant neoplasm of upper-outer quadrant of unspecified female breast: Secondary | ICD-10-CM

## 2011-11-27 DIAGNOSIS — Z5111 Encounter for antineoplastic chemotherapy: Secondary | ICD-10-CM

## 2011-11-27 DIAGNOSIS — I890 Lymphedema, not elsewhere classified: Secondary | ICD-10-CM

## 2011-11-27 DIAGNOSIS — Z170 Estrogen receptor positive status [ER+]: Secondary | ICD-10-CM

## 2011-11-27 LAB — CBC WITH DIFFERENTIAL/PLATELET
BASO%: 0.1 % (ref 0.0–2.0)
Basophils Absolute: 0 10*3/uL (ref 0.0–0.1)
HCT: 32.8 % — ABNORMAL LOW (ref 34.8–46.6)
HGB: 11 g/dL — ABNORMAL LOW (ref 11.6–15.9)
MCHC: 33.5 g/dL (ref 31.5–36.0)
MONO#: 0.6 10*3/uL (ref 0.1–0.9)
NEUT%: 79.1 % — ABNORMAL HIGH (ref 38.4–76.8)
RDW: 17.7 % — ABNORMAL HIGH (ref 11.2–14.5)
WBC: 8.3 10*3/uL (ref 3.9–10.3)
lymph#: 1.1 10*3/uL (ref 0.9–3.3)

## 2011-11-27 LAB — COMPREHENSIVE METABOLIC PANEL
Albumin: 4.4 g/dL (ref 3.5–5.2)
BUN: 12 mg/dL (ref 6–23)
CO2: 26 mEq/L (ref 19–32)
Calcium: 9.1 mg/dL (ref 8.4–10.5)
Chloride: 106 mEq/L (ref 96–112)
Creatinine, Ser: 0.73 mg/dL (ref 0.50–1.10)

## 2011-11-27 MED ORDER — SODIUM CHLORIDE 0.9 % IJ SOLN
10.0000 mL | INTRAMUSCULAR | Status: DC | PRN
  Administered 2011-11-27: 10 mL
  Filled 2011-11-27: qty 10

## 2011-11-27 MED ORDER — HEPARIN SOD (PORK) LOCK FLUSH 100 UNIT/ML IV SOLN
500.0000 [IU] | Freq: Once | INTRAVENOUS | Status: AC | PRN
  Administered 2011-11-27: 500 [IU]
  Filled 2011-11-27: qty 5

## 2011-11-27 MED ORDER — ACETAMINOPHEN 325 MG PO TABS
650.0000 mg | ORAL_TABLET | Freq: Once | ORAL | Status: AC
  Administered 2011-11-27: 650 mg via ORAL

## 2011-11-27 MED ORDER — DIPHENHYDRAMINE HCL 50 MG/ML IJ SOLN: 12.5000 mg | Freq: Once | INTRAMUSCULAR | Status: DC

## 2011-11-27 MED ORDER — SODIUM CHLORIDE 0.9 % IV SOLN
2.0000 mg/kg | Freq: Once | INTRAVENOUS | Status: AC
  Administered 2011-11-27: 147 mg via INTRAVENOUS
  Filled 2011-11-27: qty 7

## 2011-11-27 MED ORDER — SODIUM CHLORIDE 0.9 % IV SOLN
700.0000 mg | Freq: Once | INTRAVENOUS | Status: AC
  Administered 2011-11-27: 700 mg via INTRAVENOUS
  Filled 2011-11-27: qty 70

## 2011-11-27 MED ORDER — SODIUM CHLORIDE 0.9 % IV SOLN
Freq: Once | INTRAVENOUS | Status: AC
  Administered 2011-11-27: 14:00:00 via INTRAVENOUS

## 2011-11-27 MED ORDER — DIPHENHYDRAMINE HCL 25 MG PO CAPS
50.0000 mg | ORAL_CAPSULE | Freq: Once | ORAL | Status: DC
  Administered 2011-11-27: 50 mg via ORAL

## 2011-11-27 MED ORDER — DOCETAXEL CHEMO INJECTION 160 MG/16ML
75.0000 mg/m2 | Freq: Once | INTRAVENOUS | Status: AC
  Administered 2011-11-27: 140 mg via INTRAVENOUS
  Filled 2011-11-27: qty 14

## 2011-11-27 MED ORDER — ONDANSETRON 16 MG/50ML IVPB (CHCC)
16.0000 mg | Freq: Once | INTRAVENOUS | Status: AC
  Administered 2011-11-27: 16 mg via INTRAVENOUS

## 2011-11-27 MED ORDER — DEXAMETHASONE SODIUM PHOSPHATE 4 MG/ML IJ SOLN
20.0000 mg | Freq: Once | INTRAMUSCULAR | Status: AC
  Administered 2011-11-27: 20 mg via INTRAVENOUS

## 2011-11-27 NOTE — Progress Notes (Signed)
Hematology and Oncology Follow Up Visit  Rachael Weaver [id number] [birth year] 61 y.o. 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 She is currently week 1 cycle 5/6 planned every 3 week TCH with weekly Herceptin. Neulasta is utilize for granulocyte support on day 2.  Interim History:   Rachael Weaver is seen today for followup after her 5th  cycle of every 3 week TCH. She is due for Herceptin today. She actually feels quite well overall denying any unexplained fevers, chills, or night sweats. She did receive Neulasta on day 2, she denies a diffuse bony discomfort. No shortness of breath or chest pain. She has not been troubled with nausea, or emesis. She is staying well hydrated. She was seen last week out of concern that she may have some lymphedema. We have referred to the lymphedema clinic. She had 2-D echo few days ago which showed normal ejection fraction 55 and 60. She herself is doing fairly well. Denies any other side effects from his glad that she is approaching the end of her chemotherapy.  Review of Systems: Constitutional:  no weight loss, fever, night sweats, feels well and fatigued Eyes: uses glasses ENT: no complaints Cardiovascular: no chest pain or dyspnea on exertion Respiratory: no cough, shortness of breath, or wheezing Neurological: no TIA or stroke symptoms Dermatological: negative Gastrointestinal: no abdominal pain, change in bowel habits, or black or bloody stools Genito-Urinary: no dysuria, trouble voiding, or hematuria Hematological and Lymphatic: negative Breast: negative Musculoskeletal: negative Remaining ROS negative.  Medications:   I have reviewed the  patient's current medications.  Current Outpatient Prescriptions  Medication Sig Dispense Refill  . aspirin-acetaminophen-caffeine (EXCEDRIN MIGRAINE) 250-250-65 MG per tablet Take 2 tablets by mouth as needed.      Marland Kitchen dexamethasone (DECADRON) 4 MG tablet Take 2 tablets two times a day the day before Taxotere. Then take 2 tabs two times a day starting the day after chemo for 3 days.  30 tablet  1  . lansoprazole (PREVACID) 15 MG capsule Take 15 mg by mouth daily.      Marland Kitchen loratadine (CLARITIN) 10 MG tablet Take 10 mg by mouth daily.      Marland Kitchen LORazepam (ATIVAN) 0.5 MG tablet Take 1 tablet (0.5 mg total) by mouth every 6 (six) hours as needed (Nausea or vomiting).  30 tablet  0  . naproxen sodium (ANAPROX) 220 MG tablet Take 220 mg by mouth 2 (two) times daily with a meal.      . ondansetron (ZOFRAN) 8 MG tablet Take 1 tablet two times a day starting the day after chemo for 3 days. Then take 1 tab two times a day as needed for nausea or vomiting.  30 tablet  1  . prochlorperazine (COMPAZINE) 10 MG tablet Take 1 tablet (10 mg total) by mouth every 6 (six) hours as needed (Nausea or vomiting).  30 tablet  1  . prochlorperazine (COMPAZINE) 25 MG suppository Place 1 suppository (25 mg total) rectally every 12 (twelve) hours as needed for nausea.  12 suppository  3  . omeprazole (PRILOSEC) 10 MG capsule Take 10 mg by mouth daily.        Allergies: No Known Allergies  Physical Exam: Filed Vitals:  11/27/11 1200  BP: 145/78  Pulse: 96  Temp: 98.4 F (36.9 C)    Body mass index is 30.06 kg/(m^2). Weight: 170 lbs. HEENT:  Sclerae anicteric, conjunctivae pink.  Oropharynx clear.  No mucositis or candidiasis.   Nodes:  No cervical, supraclavicular, or axillary lymphadenopathy palpated.  Breast Exam:  Deferred. Lungs:  Clear to auscultation bilaterally.  No crackles, rhonchi, or wheezes.   Heart:  Regular rate and rhythm.   Abdomen:  Soft, nontender.  Positive bowel sounds.  No organomegaly or masses  palpated.   Musculoskeletal:  No focal spinal tenderness to palpation.  Extremities:  Benign.  No peripheral edema or cyanosis.   Skin:  Benign.   Neuro:  Nonfocal, alert and oriented x 3.  Lab Results: Lab Results  Component Value Date   WBC 8.3 11/27/2011   HGB 11.0* 11/27/2011   HCT 32.8* 11/27/2011   MCV 97.0 11/27/2011   PLT 158 11/27/2011   NEUTROABS 6.6* 11/27/2011     Chemistry      Component Value Date/Time   NA 139 11/06/2011 0934   K 3.9 11/06/2011 0934   CL 105 11/06/2011 0934   CO2 25 11/06/2011 0934   BUN 15 11/06/2011 0934   CREATININE 0.72 11/06/2011 0934      Component Value Date/Time   CALCIUM 9.1 11/06/2011 0934   ALKPHOS 96 11/06/2011 0934   AST 19 11/06/2011 0934   ALT 29 11/06/2011 0934   BILITOT 0.4 11/06/2011 0934      Lab Results  Component Value Date   LABCA2 33 07/30/2011    Assessment:  Rachael Weaver is a 61 year old [place] woman with a 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 She is currently week 1 cycle 5/6 planned every 3 week TCH with weekly Herceptin. Neulasta is utilize for granulocyte support on day 2.  2. Mild peripheral neuropathy symptoms.     Plan:  Rachael Weaver will receive week 1 cycle 4 of every 3 week TCH today as scheduled which is single agent Herceptin. Her Benadryl has been decreased to 12.5 mg. she is doing well overall. She scheduled to return next week. I've referred lymphedema clinic. Of note is that there is really minimal lymphedema noted on my exam today. She has clear lung fields normal heart sounds no evidence of peripheral edema.  Jett Fukuda,md 11/27/2011

## 2011-11-27 NOTE — Patient Instructions (Signed)
[facility] Discharge Instructions for Patients Receiving Chemotherapy  Today you received the following chemotherapy agents Taxotere and Carboplatin  To help prevent nausea and vomiting after your treatment, we encourage you to take your nausea medication Begin taking it at 7 pm and take it as often as prescribed for the next 24 to 72 hours.   If you develop nausea and vomiting that is not controlled by your nausea medication, call the clinic. If it is after clinic hours your family physician or the after hours number for the clinic or go to the Emergency Department.   BELOW ARE SYMPTOMS THAT SHOULD BE REPORTED IMMEDIATELY:  *FEVER GREATER THAN 100.5 F  *CHILLS WITH OR WITHOUT FEVER  NAUSEA AND VOMITING THAT IS NOT CONTROLLED WITH YOUR NAUSEA MEDICATION  *UNUSUAL SHORTNESS OF BREATH  *UNUSUAL BRUISING OR BLEEDING  TENDERNESS IN MOUTH AND THROAT WITH OR WITHOUT PRESENCE OF ULCERS  *URINARY PROBLEMS  *BOWEL PROBLEMS  UNUSUAL RASH Items with * indicate a potential emergency and should be followed up as soon as possible.  One of the nurses will contact you 24 hours after your treatment. Please let the nurse know about any problems that you may have experienced. Feel free to call the clinic you have any questions or concerns. The clinic phone number is [phone].   I have been informed and understand all the instructions given to me. I know to contact the clinic, my physician, or go to the Emergency Department if any problems should occur. I do not have any questions at this time, but understand that I may call the clinic during office hours   should I have any questions or need assistance in obtaining follow up care.    __________________________________________  _____________  __________ Signature of Patient or Authorized Representative            Date                   Time    __________________________________________ Nurse's Signature    

## 2011-11-28 ENCOUNTER — Ambulatory Visit (HOSPITAL_BASED_OUTPATIENT_CLINIC_OR_DEPARTMENT_OTHER): Payer: Medicaid Other

## 2011-11-28 ENCOUNTER — Other Ambulatory Visit: Payer: Self-pay | Admitting: Family

## 2011-11-28 VITALS — BP 147/87 | HR 88 | Temp 98.0°F

## 2011-11-28 DIAGNOSIS — Z5189 Encounter for other specified aftercare: Secondary | ICD-10-CM

## 2011-11-28 DIAGNOSIS — C50919 Malignant neoplasm of unspecified site of unspecified female breast: Secondary | ICD-10-CM

## 2011-11-28 DIAGNOSIS — C50419 Malignant neoplasm of upper-outer quadrant of unspecified female breast: Secondary | ICD-10-CM

## 2011-11-28 MED ORDER — PEGFILGRASTIM INJECTION 6 MG/0.6ML
6.0000 mg | Freq: Once | SUBCUTANEOUS | Status: AC
  Administered 2011-11-28: 6 mg via SUBCUTANEOUS
  Filled 2011-11-28: qty 0.6

## 2011-11-28 NOTE — Patient Instructions (Signed)
Call MD for problems 

## 2011-12-04 ENCOUNTER — Ambulatory Visit (HOSPITAL_BASED_OUTPATIENT_CLINIC_OR_DEPARTMENT_OTHER): Payer: Medicaid Other | Admitting: Oncology

## 2011-12-04 ENCOUNTER — Ambulatory Visit (HOSPITAL_BASED_OUTPATIENT_CLINIC_OR_DEPARTMENT_OTHER): Payer: Medicaid Other

## 2011-12-04 ENCOUNTER — Other Ambulatory Visit (HOSPITAL_BASED_OUTPATIENT_CLINIC_OR_DEPARTMENT_OTHER): Payer: Medicaid Other

## 2011-12-04 VITALS — BP 158/75 | HR 106 | Temp 99.0°F | Resp 20 | Ht 64.0 in | Wt 174.1 lb

## 2011-12-04 DIAGNOSIS — C50419 Malignant neoplasm of upper-outer quadrant of unspecified female breast: Secondary | ICD-10-CM

## 2011-12-04 DIAGNOSIS — Z170 Estrogen receptor positive status [ER+]: Secondary | ICD-10-CM

## 2011-12-04 DIAGNOSIS — C50919 Malignant neoplasm of unspecified site of unspecified female breast: Secondary | ICD-10-CM

## 2011-12-04 DIAGNOSIS — G609 Hereditary and idiopathic neuropathy, unspecified: Secondary | ICD-10-CM

## 2011-12-04 DIAGNOSIS — Z5112 Encounter for antineoplastic immunotherapy: Secondary | ICD-10-CM

## 2011-12-04 LAB — CBC WITH DIFFERENTIAL/PLATELET
Basophils Absolute: 0 10*3/uL (ref 0.0–0.1)
Eosinophils Absolute: 0 10*3/uL (ref 0.0–0.5)
HGB: 10.3 g/dL — ABNORMAL LOW (ref 11.6–15.9)
MCV: 97.1 fL (ref 79.5–101.0)
MONO#: 1.4 10*3/uL — ABNORMAL HIGH (ref 0.1–0.9)
MONO%: 18.6 % — ABNORMAL HIGH (ref 0.0–14.0)
NEUT#: 3.5 10*3/uL (ref 1.5–6.5)
RDW: 16 % — ABNORMAL HIGH (ref 11.2–14.5)
WBC: 7.3 10*3/uL (ref 3.9–10.3)
nRBC: 0 % (ref 0–0)

## 2011-12-04 MED ORDER — SODIUM CHLORIDE 0.9 % IJ SOLN
10.0000 mL | INTRAMUSCULAR | Status: DC | PRN
  Filled 2011-12-04: qty 10

## 2011-12-04 MED ORDER — HEPARIN SOD (PORK) LOCK FLUSH 100 UNIT/ML IV SOLN
500.0000 [IU] | Freq: Once | INTRAVENOUS | Status: DC | PRN
  Filled 2011-12-04: qty 5

## 2011-12-04 MED ORDER — ACETAMINOPHEN 325 MG PO TABS
650.0000 mg | ORAL_TABLET | Freq: Once | ORAL | Status: AC
  Administered 2011-12-04: 650 mg via ORAL

## 2011-12-04 MED ORDER — SODIUM CHLORIDE 0.9 % IV SOLN
Freq: Once | INTRAVENOUS | Status: AC
  Administered 2011-12-04: 14:00:00 via INTRAVENOUS

## 2011-12-04 MED ORDER — DIPHENHYDRAMINE HCL 50 MG/ML IJ SOLN
12.5000 mg | Freq: Once | INTRAMUSCULAR | Status: AC
  Administered 2011-12-04: 12.5 mg via INTRAVENOUS

## 2011-12-04 MED ORDER — TRASTUZUMAB CHEMO INJECTION 440 MG
2.0000 mg/kg | Freq: Once | INTRAVENOUS | Status: AC
  Administered 2011-12-04: 147 mg via INTRAVENOUS
  Filled 2011-12-04: qty 7

## 2011-12-04 NOTE — Patient Instructions (Signed)
[facility] Discharge Instructions for Patients Receiving Chemotherapy  Today you received the following chemotherapy agents Herceptin.  To help prevent nausea and vomiting after your treatment, we encourage you to take your nausea medication as ordered per MD.    If you develop nausea and vomiting that is not controlled by your nausea medication, call the clinic. If it is after clinic hours your family physician or the after hours number for the clinic or go to the Emergency Department.   BELOW ARE SYMPTOMS THAT SHOULD BE REPORTED IMMEDIATELY:  *FEVER GREATER THAN 100.5 F  *CHILLS WITH OR WITHOUT FEVER  NAUSEA AND VOMITING THAT IS NOT CONTROLLED WITH YOUR NAUSEA MEDICATION  *UNUSUAL SHORTNESS OF BREATH  *UNUSUAL BRUISING OR BLEEDING  TENDERNESS IN MOUTH AND THROAT WITH OR WITHOUT PRESENCE OF ULCERS  *URINARY PROBLEMS  *BOWEL PROBLEMS  UNUSUAL RASH Items with * indicate a potential emergency and should be followed up as soon as possible.  One of the nurses will contact you 24 hours after your treatment. Please let the nurse know about any problems that you may have experienced. Feel free to call the clinic you have any questions or concerns. The clinic phone number is [phone].   I have been informed and understand all the instructions given to me. I know to contact the clinic, my physician, or go to the Emergency Department if any problems should occur. I do not have any questions at this time, but understand that I may call the clinic during office hours   should I have any questions or need assistance in obtaining follow up care.    __________________________________________  _____________  __________ Signature of Patient or Authorized Representative            Date                   Time    __________________________________________ Nurse's Signature

## 2011-12-04 NOTE — Progress Notes (Signed)
Hematology and Oncology Follow Up Visit  Rachael Weaver [id number] [birth year] 61 y.o. 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 She is currently week 2 cycle 5/6 planned every 3 week TCH with weekly Herceptin. Neulasta is utilize for granulocyte support on day 2.  Interim History:   And has returned today to receive Herceptin alone. She is due for her last cycle of TCH next week and she is excited about this. She has not had no changes in the past week or so she feels otherwise well filled with a better than she did. 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 She has a schedule. She's had no other intercurrent problems..  Review of Systems: Constitutional:  no weight loss, fever, night sweats, feels well and fatigued Eyes: uses glasses ENT: no complaints Cardiovascular: no chest pain or dyspnea on exertion Respiratory: no cough, shortness of breath, or wheezing Neurological: no TIA or stroke symptoms Dermatological: negative Gastrointestinal: no abdominal pain, change in bowel habits, or black or bloody stools Genito-Urinary: no dysuria, trouble voiding, or hematuria Hematological and Lymphatic: negative Breast: negative Musculoskeletal: negative Remaining ROS negative.  Medications:   I have reviewed the patient's current medications.  Current Outpatient Prescriptions  Medication Sig Dispense Refill  . aspirin-acetaminophen-caffeine (EXCEDRIN MIGRAINE) 250-250-65 MG per tablet Take 2 tablets by mouth as needed.      Marland Kitchen dexamethasone (DECADRON) 4 MG tablet Take 2 tablets two times a day the day before Taxotere. Then take 2 tabs two times a day starting the  day after chemo for 3 days.  30 tablet  1  . lansoprazole (PREVACID) 15 MG capsule Take 15 mg by mouth daily.      Marland Kitchen loratadine (CLARITIN) 10 MG tablet Take 10 mg by mouth daily.      Marland Kitchen LORazepam (ATIVAN) 0.5 MG tablet Take 1 tablet (0.5 mg total) by mouth every 6 (six) hours as needed (Nausea or vomiting).  30 tablet  0  . naproxen sodium (ANAPROX) 220 MG tablet Take 220 mg by mouth 2 (two) times daily with a meal.      . omeprazole (PRILOSEC) 10 MG capsule Take 10 mg by mouth daily.      . ondansetron (ZOFRAN) 8 MG tablet Take 1 tablet two times a day starting the day after chemo for 3 days. Then take 1 tab two times a day as needed for nausea or vomiting.  30 tablet  1  . prochlorperazine (COMPAZINE) 10 MG tablet Take 1 tablet (10 mg total) by mouth every 6 (six) hours as needed (Nausea or vomiting).  30 tablet  1  . prochlorperazine (COMPAZINE) 25 MG suppository Place 1 suppository (25 mg total) rectally every 12 (twelve) hours as needed for nausea.  12 suppository  3    Allergies: No Known Allergies  Physical Exam: Filed Vitals:   12/04/11 1246  BP: 158/75  Pulse: 106  Temp: 99 F (37.2 C)  Resp: 20    Body mass index is 29.88 kg/(m^2). Weight: 170 lbs. HEENT:  Sclerae anicteric, conjunctivae pink.  Oropharynx clear.  No mucositis or candidiasis.   Nodes:  No cervical, supraclavicular, or axillary lymphadenopathy palpated.  Breast Exam:  Deferred. Lungs:  Clear to auscultation bilaterally.  No crackles, rhonchi, or wheezes.   Heart:  Regular rate and rhythm.   Abdomen:  Soft, nontender.  Positive bowel sounds.  No organomegaly or masses palpated.   Musculoskeletal:  No focal spinal tenderness to palpation.  Extremities:  Benign.  No peripheral edema or cyanosis.   Skin:  Benign.   Neuro:  Nonfocal, alert and oriented x 3.  Lab Results: Lab Results  Component Value Date   WBC 7.3 12/04/2011   HGB 10.3* 12/04/2011   HCT 30.5* 12/04/2011   MCV 97.1 12/04/2011   PLT 156 12/04/2011    NEUTROABS 3.5 12/04/2011     Chemistry      Component Value Date/Time   NA 142 11/27/2011 1144   K 3.8 11/27/2011 1144   CL 106 11/27/2011 1144   CO2 26 11/27/2011 1144   BUN 12 11/27/2011 1144   CREATININE 0.73 11/27/2011 1144      Component Value Date/Time   CALCIUM 9.1 11/27/2011 1144   ALKPHOS 85 11/27/2011 1144   AST 19 11/27/2011 1144   ALT 22 11/27/2011 1144   BILITOT 0.4 11/27/2011 1144      Lab Results  Component Value Date   LABCA2 33 07/30/2011    Assessment:  Rachael Weaver is a 61 year old [place] woman with a 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 She is currently week 2 cycle 5/6 planned every 3 week TCH with weekly Herceptin. Neulasta is utilize for granulocyte support on day 2.  2. Mild peripheral neuropathy symptoms.     Plan:  Rachael Weaver will receive Herceptin alone today and will be seen again next week.   Varetta Chavers,md 12/04/2011

## 2011-12-08 ENCOUNTER — Ambulatory Visit: Payer: Medicaid Other | Admitting: Family

## 2011-12-11 ENCOUNTER — Encounter: Payer: Self-pay | Admitting: *Deleted

## 2011-12-11 ENCOUNTER — Ambulatory Visit (HOSPITAL_BASED_OUTPATIENT_CLINIC_OR_DEPARTMENT_OTHER): Payer: Medicaid Other

## 2011-12-11 VITALS — BP 134/76 | HR 93 | Temp 99.3°F | Resp 20

## 2011-12-11 DIAGNOSIS — C50419 Malignant neoplasm of upper-outer quadrant of unspecified female breast: Secondary | ICD-10-CM

## 2011-12-11 DIAGNOSIS — Z5112 Encounter for antineoplastic immunotherapy: Secondary | ICD-10-CM

## 2011-12-11 DIAGNOSIS — C50919 Malignant neoplasm of unspecified site of unspecified female breast: Secondary | ICD-10-CM

## 2011-12-11 MED ORDER — SODIUM CHLORIDE 0.9 % IJ SOLN
10.0000 mL | INTRAMUSCULAR | Status: DC | PRN
  Administered 2011-12-11: 10 mL
  Filled 2011-12-11: qty 10

## 2011-12-11 MED ORDER — SODIUM CHLORIDE 0.9 % IV SOLN
Freq: Once | INTRAVENOUS | Status: AC
  Administered 2011-12-11: 15:00:00 via INTRAVENOUS

## 2011-12-11 MED ORDER — TRASTUZUMAB CHEMO INJECTION 440 MG
2.0000 mg/kg | Freq: Once | INTRAVENOUS | Status: AC
  Administered 2011-12-11: 147 mg via INTRAVENOUS
  Filled 2011-12-11: qty 7

## 2011-12-11 MED ORDER — DIPHENHYDRAMINE HCL 50 MG/ML IJ SOLN
12.5000 mg | Freq: Once | INTRAMUSCULAR | Status: AC
  Administered 2011-12-11: 15:00:00 via INTRAVENOUS

## 2011-12-11 MED ORDER — HEPARIN SOD (PORK) LOCK FLUSH 100 UNIT/ML IV SOLN
500.0000 [IU] | Freq: Once | INTRAVENOUS | Status: AC | PRN
  Administered 2011-12-11: 500 [IU]
  Filled 2011-12-11: qty 5

## 2011-12-11 MED ORDER — ACETAMINOPHEN 325 MG PO TABS
650.0000 mg | ORAL_TABLET | Freq: Once | ORAL | Status: AC
  Administered 2011-12-11: 650 mg via ORAL

## 2011-12-11 NOTE — Patient Instructions (Signed)
Pt will call with problems 

## 2011-12-12 ENCOUNTER — Telehealth: Payer: Self-pay | Admitting: *Deleted

## 2011-12-12 NOTE — Telephone Encounter (Signed)
patient has an appointment for 12-15-2011 at 8:00am

## 2011-12-15 ENCOUNTER — Ambulatory Visit: Payer: Medicaid Other | Attending: Oncology | Admitting: Physical Therapy

## 2011-12-15 DIAGNOSIS — IMO0001 Reserved for inherently not codable concepts without codable children: Secondary | ICD-10-CM | POA: Insufficient documentation

## 2011-12-15 DIAGNOSIS — I890 Lymphedema, not elsewhere classified: Secondary | ICD-10-CM | POA: Insufficient documentation

## 2011-12-15 DIAGNOSIS — M25619 Stiffness of unspecified shoulder, not elsewhere classified: Secondary | ICD-10-CM | POA: Insufficient documentation

## 2011-12-18 ENCOUNTER — Other Ambulatory Visit (HOSPITAL_BASED_OUTPATIENT_CLINIC_OR_DEPARTMENT_OTHER): Payer: Medicaid Other | Admitting: Lab

## 2011-12-18 ENCOUNTER — Other Ambulatory Visit: Payer: Medicaid Other | Admitting: Lab

## 2011-12-18 ENCOUNTER — Ambulatory Visit (HOSPITAL_BASED_OUTPATIENT_CLINIC_OR_DEPARTMENT_OTHER): Payer: Medicaid Other

## 2011-12-18 ENCOUNTER — Ambulatory Visit (HOSPITAL_BASED_OUTPATIENT_CLINIC_OR_DEPARTMENT_OTHER): Payer: Medicaid Other | Admitting: Oncology

## 2011-12-18 VITALS — BP 154/77 | HR 103 | Temp 98.9°F | Resp 20 | Ht 64.0 in | Wt 175.8 lb

## 2011-12-18 DIAGNOSIS — C50919 Malignant neoplasm of unspecified site of unspecified female breast: Secondary | ICD-10-CM

## 2011-12-18 DIAGNOSIS — C50419 Malignant neoplasm of upper-outer quadrant of unspecified female breast: Secondary | ICD-10-CM

## 2011-12-18 DIAGNOSIS — G609 Hereditary and idiopathic neuropathy, unspecified: Secondary | ICD-10-CM

## 2011-12-18 DIAGNOSIS — Z170 Estrogen receptor positive status [ER+]: Secondary | ICD-10-CM

## 2011-12-18 DIAGNOSIS — Z5111 Encounter for antineoplastic chemotherapy: Secondary | ICD-10-CM

## 2011-12-18 LAB — COMPREHENSIVE METABOLIC PANEL
ALT: 31 U/L (ref 0–35)
Albumin: 4.4 g/dL (ref 3.5–5.2)
CO2: 27 mEq/L (ref 19–32)
Calcium: 9.2 mg/dL (ref 8.4–10.5)
Chloride: 106 mEq/L (ref 96–112)
Glucose, Bld: 98 mg/dL (ref 70–99)
Potassium: 3.6 mEq/L (ref 3.5–5.3)
Sodium: 141 mEq/L (ref 135–145)
Total Bilirubin: 0.5 mg/dL (ref 0.3–1.2)
Total Protein: 6.7 g/dL (ref 6.0–8.3)

## 2011-12-18 LAB — CBC WITH DIFFERENTIAL/PLATELET
BASO%: 0.1 % (ref 0.0–2.0)
Eosinophils Absolute: 0 10*3/uL (ref 0.0–0.5)
LYMPH%: 17.1 % (ref 14.0–49.7)
MCHC: 33.1 g/dL (ref 31.5–36.0)
MONO#: 1 10*3/uL — ABNORMAL HIGH (ref 0.1–0.9)
NEUT#: 7.1 10*3/uL — ABNORMAL HIGH (ref 1.5–6.5)
RBC: 3.22 10*6/uL — ABNORMAL LOW (ref 3.70–5.45)
RDW: 16.2 % — ABNORMAL HIGH (ref 11.2–14.5)
WBC: 9.7 10*3/uL (ref 3.9–10.3)
nRBC: 0 % (ref 0–0)

## 2011-12-18 MED ORDER — SODIUM CHLORIDE 0.9 % IJ SOLN
10.0000 mL | INTRAMUSCULAR | Status: DC | PRN
  Administered 2011-12-18: 10 mL
  Filled 2011-12-18: qty 10

## 2011-12-18 MED ORDER — SODIUM CHLORIDE 0.9 % IV SOLN
700.0000 mg | Freq: Once | INTRAVENOUS | Status: AC
  Administered 2011-12-18: 700 mg via INTRAVENOUS
  Filled 2011-12-18: qty 70

## 2011-12-18 MED ORDER — HEPARIN SOD (PORK) LOCK FLUSH 100 UNIT/ML IV SOLN
500.0000 [IU] | Freq: Once | INTRAVENOUS | Status: AC | PRN
  Administered 2011-12-18: 500 [IU]
  Filled 2011-12-18: qty 5

## 2011-12-18 MED ORDER — ACETAMINOPHEN 325 MG PO TABS
650.0000 mg | ORAL_TABLET | Freq: Once | ORAL | Status: AC
  Administered 2011-12-18: 650 mg via ORAL

## 2011-12-18 MED ORDER — ONDANSETRON 16 MG/50ML IVPB (CHCC)
16.0000 mg | Freq: Once | INTRAVENOUS | Status: AC
  Administered 2011-12-18: 16 mg via INTRAVENOUS

## 2011-12-18 MED ORDER — DIPHENHYDRAMINE HCL 50 MG/ML IJ SOLN
12.5000 mg | Freq: Once | INTRAMUSCULAR | Status: AC
  Administered 2011-12-18: 12.5 mg via INTRAVENOUS

## 2011-12-18 MED ORDER — DEXAMETHASONE SODIUM PHOSPHATE 4 MG/ML IJ SOLN
20.0000 mg | Freq: Once | INTRAMUSCULAR | Status: AC
  Administered 2011-12-18: 20 mg via INTRAVENOUS

## 2011-12-18 MED ORDER — DOCETAXEL CHEMO INJECTION 160 MG/16ML
75.0000 mg/m2 | Freq: Once | INTRAVENOUS | Status: AC
  Administered 2011-12-18: 140 mg via INTRAVENOUS
  Filled 2011-12-18: qty 14

## 2011-12-18 MED ORDER — SODIUM CHLORIDE 0.9 % IV SOLN
Freq: Once | INTRAVENOUS | Status: AC
  Administered 2011-12-18: 14:00:00 via INTRAVENOUS

## 2011-12-18 MED ORDER — TRASTUZUMAB CHEMO INJECTION 440 MG
2.0000 mg/kg | Freq: Once | INTRAVENOUS | Status: AC
  Administered 2011-12-18: 147 mg via INTRAVENOUS
  Filled 2011-12-18: qty 7

## 2011-12-18 NOTE — Progress Notes (Signed)
Hematology and Oncology Follow Up Visit  Rachael Weaver [id number] Jan 29, 1951 61 y.o. 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 She is currently week 2 cycle 5/6 planned every 3 week TCH with weekly Herceptin. Neulasta is utilize for granulocyte support on day 2.  Interim History:   And has returned today to receive Herceptin alone. She is due for her last cycle of TCH next week and she is excited about this. She has not had no changes in the past week or so she feels otherwise well filled with a better than she did. 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 She has a schedule. She's had no other intercurrent problems..  Review of Systems: Constitutional:  no weight loss, fever, night sweats, feels well and fatigued Eyes: uses glasses ENT: no complaints Cardiovascular: no chest pain or dyspnea on exertion Respiratory: no cough, shortness of breath, or wheezing Neurological: no TIA or stroke symptoms Dermatological: negative Gastrointestinal: no abdominal pain, change in bowel habits, or black or bloody stools Genito-Urinary: no dysuria, trouble voiding, or hematuria Hematological and Lymphatic: negative Breast: negative Musculoskeletal: negative Remaining ROS negative.  Medications:   I have reviewed the patient's current medications.  Current Outpatient Prescriptions  Medication Sig Dispense Refill  . aspirin 81 MG tablet Take 81 mg by mouth daily.      Marland Kitchen aspirin-acetaminophen-caffeine (EXCEDRIN MIGRAINE) 250-250-65 MG per tablet Take 2 tablets by mouth as needed.      Marland Kitchen dexamethasone (DECADRON) 4 MG tablet Take 2 tablets two times a day the day before  Taxotere. Then take 2 tabs two times a day starting the day after chemo for 3 days.  30 tablet  1  . lansoprazole (PREVACID) 15 MG capsule Take 15 mg by mouth daily.      Marland Kitchen loratadine (CLARITIN) 10 MG tablet Take 10 mg by mouth daily.      Marland Kitchen LORazepam (ATIVAN) 0.5 MG tablet Take 1 tablet (0.5 mg total) by mouth every 6 (six) hours as needed (Nausea or vomiting).  30 tablet  0  . naproxen sodium (ANAPROX) 220 MG tablet Take 220 mg by mouth 2 (two) times daily with a meal.      . omeprazole (PRILOSEC) 10 MG capsule Take 10 mg by mouth daily.      . ondansetron (ZOFRAN) 8 MG tablet Take 1 tablet two times a day starting the day after chemo for 3 days. Then take 1 tab two times a day as needed for nausea or vomiting.  30 tablet  1  . prochlorperazine (COMPAZINE) 10 MG tablet Take 1 tablet (10 mg total) by mouth every 6 (six) hours as needed (Nausea or vomiting).  30 tablet  1  . prochlorperazine (COMPAZINE) 25 MG suppository Place 1 suppository (25 mg total) rectally every 12 (twelve) hours as needed for nausea.  12 suppository  3    Allergies: No Known Allergies  Physical Exam: Filed Vitals:   12/18/11 1222  BP: 154/77  Pulse: 103  Temp: 98.9 F (37.2 C)  Resp: 20    Body mass index is 30.18 kg/(m^2). Weight: 170 lbs. HEENT:  Sclerae anicteric, conjunctivae pink.  Oropharynx clear.  No mucositis or candidiasis.  Nodes:  No cervical, supraclavicular, or axillary lymphadenopathy palpated.  Breast Exam:  Deferred. Lungs:  Clear to auscultation bilaterally.  No crackles, rhonchi, or wheezes.   Heart:  Regular rate and rhythm.   Abdomen:  Soft, nontender.  Positive bowel sounds.  No organomegaly or masses palpated.   Musculoskeletal:  No focal spinal tenderness to palpation.  Extremities:  Benign.  No peripheral edema or cyanosis.   Skin:  Benign.   Neuro:  Nonfocal, alert and oriented x 3.  Lab Results: Lab Results  Component Value Date   WBC 9.7 12/18/2011   HGB 10.6* 12/18/2011   HCT  32.0* 12/18/2011   MCV 99.4 12/18/2011   PLT 219 12/18/2011   NEUTROABS 7.1* 12/18/2011     Chemistry      Component Value Date/Time   NA 142 11/27/2011 1144   K 3.8 11/27/2011 1144   CL 106 11/27/2011 1144   CO2 26 11/27/2011 1144   BUN 12 11/27/2011 1144   CREATININE 0.73 11/27/2011 1144      Component Value Date/Time   CALCIUM 9.1 11/27/2011 1144   ALKPHOS 85 11/27/2011 1144   AST 19 11/27/2011 1144   ALT 22 11/27/2011 1144   BILITOT 0.4 11/27/2011 1144      Lab Results  Component Value Date   LABCA2 33 07/30/2011    Assessment:  Rachael Weaver is a 61 year old [place] woman with a 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 She is currently week 2 cycle 5/6 planned every 3 week TCH with weekly Herceptin. Neulasta is utilize for granulocyte support on day 2.  2. Mild peripheral neuropathy symptoms.     Plan:  Rachael Weaver will receive Herceptin alone today and will be seen again next week.   Rachael Recktenwald,md 12/18/2011

## 2011-12-18 NOTE — Patient Instructions (Signed)
[facility] Discharge Instructions for Patients Receiving Chemotherapy  Today you received the following chemotherapy agents Herceptin/Taxotere/Carboplatin To help prevent nausea and vomiting after your treatment, we encourage you to take your nausea medication as prescribed.  If you develop nausea and vomiting that is not controlled by your nausea medication, call the clinic. If it is after clinic hours your family physician or the after hours number for the clinic or go to the Emergency Department.   BELOW ARE SYMPTOMS THAT SHOULD BE REPORTED IMMEDIATELY:  *FEVER GREATER THAN 100.5 F  *CHILLS WITH OR WITHOUT FEVER  NAUSEA AND VOMITING THAT IS NOT CONTROLLED WITH YOUR NAUSEA MEDICATION  *UNUSUAL SHORTNESS OF BREATH  *UNUSUAL BRUISING OR BLEEDING  TENDERNESS IN MOUTH AND THROAT WITH OR WITHOUT PRESENCE OF ULCERS  *URINARY PROBLEMS  *BOWEL PROBLEMS  UNUSUAL RASH Items with * indicate a potential emergency and should be followed up as soon as possible.  One of the nurses will contact you 24 hours after your treatment. Please let the nurse know about any problems that you may have experienced. Feel free to call the clinic you have any questions or concerns. The clinic phone number is [phone].   I have been informed and understand all the instructions given to me. I know to contact the clinic, my physician, or go to the Emergency Department if any problems should occur. I do not have any questions at this time, but understand that I may call the clinic during office hours   should I have any questions or need assistance in obtaining follow up care.    __________________________________________  _____________  __________ Signature of Patient or Authorized Representative            Date                   Time    __________________________________________ Nurse's Signature    

## 2011-12-19 ENCOUNTER — Ambulatory Visit (HOSPITAL_BASED_OUTPATIENT_CLINIC_OR_DEPARTMENT_OTHER): Payer: Medicaid Other

## 2011-12-19 VITALS — BP 136/81 | HR 83 | Temp 98.4°F

## 2011-12-19 DIAGNOSIS — C50419 Malignant neoplasm of upper-outer quadrant of unspecified female breast: Secondary | ICD-10-CM

## 2011-12-19 DIAGNOSIS — C50919 Malignant neoplasm of unspecified site of unspecified female breast: Secondary | ICD-10-CM

## 2011-12-19 MED ORDER — PEGFILGRASTIM INJECTION 6 MG/0.6ML
6.0000 mg | Freq: Once | SUBCUTANEOUS | Status: AC
Start: 2011-12-19 — End: 2011-12-19
  Administered 2011-12-19: 6 mg via SUBCUTANEOUS
  Filled 2011-12-19: qty 0.6

## 2011-12-20 ENCOUNTER — Other Ambulatory Visit: Payer: Self-pay | Admitting: Oncology

## 2011-12-22 ENCOUNTER — Ambulatory Visit: Payer: Medicaid Other | Admitting: Physical Therapy

## 2011-12-24 ENCOUNTER — Other Ambulatory Visit (HOSPITAL_BASED_OUTPATIENT_CLINIC_OR_DEPARTMENT_OTHER): Payer: Medicaid Other

## 2011-12-24 ENCOUNTER — Ambulatory Visit: Payer: Medicaid Other

## 2011-12-24 ENCOUNTER — Ambulatory Visit (HOSPITAL_BASED_OUTPATIENT_CLINIC_OR_DEPARTMENT_OTHER): Payer: Medicaid Other | Admitting: Oncology

## 2011-12-24 VITALS — BP 151/72 | HR 111 | Temp 99.2°F | Resp 20 | Ht 64.0 in | Wt 174.7 lb

## 2011-12-24 DIAGNOSIS — Z170 Estrogen receptor positive status [ER+]: Secondary | ICD-10-CM

## 2011-12-24 DIAGNOSIS — C50419 Malignant neoplasm of upper-outer quadrant of unspecified female breast: Secondary | ICD-10-CM

## 2011-12-24 DIAGNOSIS — C50919 Malignant neoplasm of unspecified site of unspecified female breast: Secondary | ICD-10-CM

## 2011-12-24 LAB — COMPREHENSIVE METABOLIC PANEL (CC13)
ALT: 23 U/L (ref 0–55)
AST: 20 U/L (ref 5–34)
Albumin: 3.8 g/dL (ref 3.5–5.0)
CO2: 25 mEq/L (ref 22–29)
Calcium: 8.8 mg/dL (ref 8.4–10.4)
Chloride: 107 mEq/L (ref 98–107)
Creatinine: 0.9 mg/dL (ref 0.6–1.1)
Potassium: 3.7 mEq/L (ref 3.5–5.1)
Sodium: 140 mEq/L (ref 136–145)
Total Protein: 6.2 g/dL — ABNORMAL LOW (ref 6.4–8.3)

## 2011-12-24 LAB — CBC WITH DIFFERENTIAL/PLATELET
BASO%: 0.7 % (ref 0.0–2.0)
EOS%: 0.1 % (ref 0.0–7.0)
HCT: 31.5 % — ABNORMAL LOW (ref 34.8–46.6)
MCH: 34.7 pg — ABNORMAL HIGH (ref 25.1–34.0)
MCHC: 34 g/dL (ref 31.5–36.0)
MONO#: 0.8 10*3/uL (ref 0.1–0.9)
NEUT%: 42.9 % (ref 38.4–76.8)
RDW: 15.3 % — ABNORMAL HIGH (ref 11.2–14.5)
WBC: 5.1 10*3/uL (ref 3.9–10.3)
lymph#: 2.1 10*3/uL (ref 0.9–3.3)

## 2011-12-24 NOTE — Progress Notes (Signed)
Hematology and Oncology Follow Up Visit  Donella Pascarella [id number] Sep 23, 1950 61 y.o. 12/24/2011    HPI: Ms. 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 She is currently day 7 cycle 6/6 planned every 3 week TCH with weekly Herceptin. Neulasta is utilize for granulocyte support on day 2.  Interim History:   Patient returns for followup. She is doing well. She has no complaints. She has tolerated her last cycle extremely well and is quite happy that she is finally done. She will continue weekly Herceptin. She is receiving radiation oncologist and hopefully be on radiation within the next 2-3 weeks. I will discuss the overall plan. She has had virtually no side effects from from her chemotherapy apart from some reflux type symptoms. A detailed review of systems is otherwise noncontributory as noted below.  Review of Systems: Constitutional:  no weight loss, fever, night sweats, feels well and fatigued Eyes: uses glasses ENT: no complaints Cardiovascular: no chest pain or dyspnea on exertion Respiratory: no cough, shortness of breath, or wheezing Neurological: no TIA or stroke symptoms Dermatological: negative Gastrointestinal: no abdominal pain, change in bowel habits, or black or bloody stools Genito-Urinary: no dysuria, trouble voiding, or hematuria Hematological and Lymphatic: negative Breast: negative Musculoskeletal: negative Remaining ROS negative.  Medications:   I have reviewed the patient's current medications.  Current Outpatient Prescriptions  Medication Sig Dispense Refill  . aspirin 81 MG tablet Take 81 mg by mouth daily.      . lansoprazole (PREVACID) 15 MG capsule Take 15 mg  by mouth daily.      Marland Kitchen loratadine (CLARITIN) 10 MG tablet Take 10 mg by mouth daily.      Marland Kitchen LORazepam (ATIVAN) 0.5 MG tablet Take 1 tablet (0.5 mg total) by mouth every 6 (six) hours as needed (Nausea or vomiting).  30 tablet  0  . naproxen sodium (ANAPROX) 220 MG tablet Take 220 mg by mouth 2 (two) times daily with a meal.      . omeprazole (PRILOSEC) 10 MG capsule Take 10 mg by mouth daily.      Marland Kitchen aspirin-acetaminophen-caffeine (EXCEDRIN MIGRAINE) 250-250-65 MG per tablet Take 2 tablets by mouth as needed.        Allergies: No Known Allergies  Physical Exam: Filed Vitals:   12/24/11 1438  BP: 151/72  Pulse: 111  Temp: 99.2 F (37.3 C)  Resp: 20    Body mass index is 29.99 kg/(m^2). Weight: 171 lbs. HEENT:  Sclerae anicteric, conjunctivae pink.  Oropharynx clear.  No mucositis or candidiasis.   Nodes:  No cervical, supraclavicular, or axillary lymphadenopathy palpated.  Breast Exam:  Deferred. Lungs:  Clear to auscultation bilaterally.  No crackles, rhonchi, or wheezes.   Heart:  Regular rate and rhythm.   Abdomen:  Soft, nontender.  Positive bowel sounds.  No organomegaly or masses palpated.   Musculoskeletal:  No focal spinal tenderness to palpation.  Extremities:  Benign.  No peripheral edema or cyanosis.   Skin:  Benign.   Neuro:  Nonfocal, alert and oriented x 3.  Lab Results: Lab Results  Component Value Date   WBC 5.1 12/24/2011   HGB 10.7* 12/24/2011   HCT 31.5* 12/24/2011   MCV 102.2* 12/24/2011   PLT 165 12/24/2011  NEUTROABS 2.2 12/24/2011     Chemistry      Component Value Date/Time   NA 141 12/18/2011 1205   K 3.6 12/18/2011 1205   CL 106 12/18/2011 1205   CO2 27 12/18/2011 1205   BUN 15 12/18/2011 1205   CREATININE 0.83 12/18/2011 1205      Component Value Date/Time   CALCIUM 9.2 12/18/2011 1205   ALKPHOS 83 12/18/2011 1205   AST 23 12/18/2011 1205   ALT 31 12/18/2011 1205   BILITOT 0.5 12/18/2011 1205      Lab Results  Component Value Date   LABCA2 33  07/30/2011    Assessment:  Ms. 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 She is currently day 7 cycle 6/6 planned every 3 week TCH with weekly Herceptin. Neulasta is utilize for granulocyte support on day 2.    Plan: And will be seen in 3 week intervals. She will continue weekly Herceptin for radiation. We will check a 2-D echo in about 2-1/2 months from now. We discussed removal of port when she completes her Herceptin and also to initiation of adjuvant hormonal therapy after radiation. Once again I think she is done extraordinarily well. She is been able to work more or less full-time throughout this process.  Stormy Sabol, md 12/24/2011

## 2011-12-25 ENCOUNTER — Other Ambulatory Visit: Payer: Self-pay | Admitting: *Deleted

## 2011-12-25 ENCOUNTER — Telehealth: Payer: Self-pay | Admitting: Oncology

## 2011-12-25 ENCOUNTER — Ambulatory Visit (HOSPITAL_BASED_OUTPATIENT_CLINIC_OR_DEPARTMENT_OTHER): Payer: Medicaid Other

## 2011-12-25 ENCOUNTER — Telehealth: Payer: Self-pay | Admitting: *Deleted

## 2011-12-25 VITALS — BP 126/75 | HR 92 | Temp 98.9°F | Resp 20

## 2011-12-25 DIAGNOSIS — C50419 Malignant neoplasm of upper-outer quadrant of unspecified female breast: Secondary | ICD-10-CM

## 2011-12-25 DIAGNOSIS — C50019 Malignant neoplasm of nipple and areola, unspecified female breast: Secondary | ICD-10-CM

## 2011-12-25 DIAGNOSIS — C50919 Malignant neoplasm of unspecified site of unspecified female breast: Secondary | ICD-10-CM

## 2011-12-25 DIAGNOSIS — Z853 Personal history of malignant neoplasm of breast: Secondary | ICD-10-CM

## 2011-12-25 DIAGNOSIS — Z5112 Encounter for antineoplastic immunotherapy: Secondary | ICD-10-CM

## 2011-12-25 DIAGNOSIS — F22 Delusional disorders: Secondary | ICD-10-CM

## 2011-12-25 MED ORDER — HEPARIN SOD (PORK) LOCK FLUSH 100 UNIT/ML IV SOLN
500.0000 [IU] | Freq: Once | INTRAVENOUS | Status: AC | PRN
  Administered 2011-12-25: 500 [IU]
  Filled 2011-12-25: qty 5

## 2011-12-25 MED ORDER — ACETAMINOPHEN 325 MG PO TABS
650.0000 mg | ORAL_TABLET | Freq: Once | ORAL | Status: AC
  Administered 2011-12-25: 650 mg via ORAL

## 2011-12-25 MED ORDER — SODIUM CHLORIDE 0.9 % IJ SOLN
10.0000 mL | INTRAMUSCULAR | Status: DC | PRN
  Administered 2011-12-25: 10 mL
  Filled 2011-12-25: qty 10

## 2011-12-25 MED ORDER — DIPHENHYDRAMINE HCL 50 MG/ML IJ SOLN
12.5000 mg | Freq: Once | INTRAMUSCULAR | Status: AC
  Administered 2011-12-25: 12.5 mg via INTRAVENOUS

## 2011-12-25 MED ORDER — TRASTUZUMAB CHEMO INJECTION 440 MG
2.0000 mg/kg | Freq: Once | INTRAVENOUS | Status: AC
  Administered 2011-12-25: 147 mg via INTRAVENOUS
  Filled 2011-12-25: qty 7

## 2011-12-25 MED ORDER — SODIUM CHLORIDE 0.9 % IV SOLN
Freq: Once | INTRAVENOUS | Status: AC
  Administered 2011-12-25: 14:00:00 via INTRAVENOUS

## 2011-12-25 NOTE — Telephone Encounter (Signed)
Sent michelle an  email to setup paient's treatment

## 2011-12-25 NOTE — Telephone Encounter (Signed)
Per staff message and POF I have scheduled appts.  JMW  

## 2011-12-25 NOTE — Patient Instructions (Addendum)
[facility] Discharge Instructions for Patients Receiving Chemotherapy  Today you received the following chemotherapy agents herceptin  To help prevent nausea and vomiting after your treatment, we encourage you to take your nausea medication as directed.   If you develop nausea and vomiting that is not controlled by your nausea medication, call the clinic. If it is after clinic hours your family physician or the after hours number for the clinic or go to the Emergency Department.   BELOW ARE SYMPTOMS THAT SHOULD BE REPORTED IMMEDIATELY:  *FEVER GREATER THAN 100.5 F  *CHILLS WITH OR WITHOUT FEVER  NAUSEA AND VOMITING THAT IS NOT CONTROLLED WITH YOUR NAUSEA MEDICATION  *UNUSUAL SHORTNESS OF BREATH  *UNUSUAL BRUISING OR BLEEDING  TENDERNESS IN MOUTH AND THROAT WITH OR WITHOUT PRESENCE OF ULCERS  *URINARY PROBLEMS  *BOWEL PROBLEMS  UNUSUAL RASH Items with * indicate a potential emergency and should be followed up as soon as possible.  One of the nurses will contact you 24 hours after your treatment. Please let the nurse know about any problems that you may have experienced. Feel free to call the clinic you have any questions or concerns. The clinic phone number is [phone].   I have been informed and understand all the instructions given to me. I know to contact the clinic, my physician, or go to the Emergency Department if any problems should occur. I do not have any questions at this time, but understand that I may call the clinic during office hours   should I have any questions or need assistance in obtaining follow up care.    __________________________________________  _____________  __________ Signature of Patient or Authorized Representative            Date                   Time    __________________________________________ Nurse's Signature    

## 2011-12-25 NOTE — Telephone Encounter (Signed)
gve the pt her sept,oct 2013 appt calendars °

## 2011-12-30 ENCOUNTER — Encounter: Payer: Self-pay | Admitting: Radiation Oncology

## 2011-12-30 NOTE — Progress Notes (Signed)
61 year old female.   S/P right breast lumpectomy with sentinel node dissection on 08/21/2011 without lymph node involvement. 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 Completed 6/6 planned every three week TCH with weekly Herceptin. Neulasta support on day 2.   NKDA No hx of radiation therapy No indication of a pacemaker

## 2011-12-31 ENCOUNTER — Ambulatory Visit
Admission: RE | Admit: 2011-12-31 | Discharge: 2011-12-31 | Disposition: A | Discharge status: Home / Self Care | Payer: Medicaid Other | Source: Ambulatory Visit | Attending: Radiation Oncology | Admitting: Radiation Oncology

## 2011-12-31 ENCOUNTER — Encounter: Payer: Medicaid Other | Admitting: Physical Therapy

## 2011-12-31 ENCOUNTER — Encounter: Payer: Self-pay | Admitting: Radiation Oncology

## 2011-12-31 VITALS — BP 137/79 | HR 91 | Temp 98.8°F | Resp 20 | Wt 177.7 lb

## 2011-12-31 DIAGNOSIS — L988 Other specified disorders of the skin and subcutaneous tissue: Secondary | ICD-10-CM | POA: Insufficient documentation

## 2011-12-31 DIAGNOSIS — C50419 Malignant neoplasm of upper-outer quadrant of unspecified female breast: Secondary | ICD-10-CM

## 2011-12-31 DIAGNOSIS — Z510 Encounter for antineoplastic radiation therapy: Secondary | ICD-10-CM | POA: Insufficient documentation

## 2011-12-31 DIAGNOSIS — C50919 Malignant neoplasm of unspecified site of unspecified female breast: Secondary | ICD-10-CM | POA: Insufficient documentation

## 2011-12-31 DIAGNOSIS — Z7982 Long term (current) use of aspirin: Secondary | ICD-10-CM | POA: Insufficient documentation

## 2011-12-31 DIAGNOSIS — T7840XA Allergy, unspecified, initial encounter: Secondary | ICD-10-CM | POA: Insufficient documentation

## 2011-12-31 DIAGNOSIS — Z79899 Other long term (current) drug therapy: Secondary | ICD-10-CM | POA: Insufficient documentation

## 2011-12-31 DIAGNOSIS — L259 Unspecified contact dermatitis, unspecified cause: Secondary | ICD-10-CM | POA: Insufficient documentation

## 2011-12-31 HISTORY — DX: Unspecified osteoarthritis, unspecified site: M19.90

## 2011-12-31 HISTORY — DX: Allergy, unspecified, initial encounter: T78.40XA

## 2011-12-31 HISTORY — DX: Anxiety disorder, unspecified: F41.9

## 2011-12-31 NOTE — Progress Notes (Signed)
   Department of Radiation Oncology  Phone:  [phone] Fax:        [phone]   Name: Rachael Weaver   DOB: 28-Feb-1951  MRN: [id number]    Date: 12/31/2011  Follow Up Visit Note  Diagnosis: T2N0 triple positive right breast cancer  Interval History: Rachael Weaver presents today for routine followup.  A son grade with her chemotherapy. She is maintained a full work schedule. Her last chemotherapy was at about 2 weeks ago. She unfortunately did develop a little bit a lymphedema of her right arm. She has been recommended to do a lymphedema sleeve 24 hours a day but is only wearing it on an as-needed basis and doing manual lymph drainage on her own. We'll continue on every 3 week Herceptin. She is ready for radiation and would like to get that started as soon as possible.  Allergies: No Known Allergies  Medications:  Current Outpatient Prescriptions  Medication Sig Dispense Refill  . aspirin 81 MG tablet Take 81 mg by mouth every other day.       Marland Kitchen aspirin-acetaminophen-caffeine (EXCEDRIN MIGRAINE) 250-250-65 MG per tablet Take 2 tablets by mouth as needed.      . lansoprazole (PREVACID) 15 MG capsule Take 15 mg by mouth daily.      Marland Kitchen loratadine (CLARITIN) 10 MG tablet Take 10 mg by mouth daily.      Marland Kitchen LORazepam (ATIVAN) 0.5 MG tablet Take 1 tablet (0.5 mg total) by mouth every 6 (six) hours as needed (Nausea or vomiting).  30 tablet  0  . naproxen sodium (ANAPROX) 220 MG tablet Take 220 mg by mouth 2 (two) times daily with a meal.      . omeprazole (PRILOSEC) 10 MG capsule Take 10 mg by mouth daily.       No current facility-administered medications for this encounter.   Facility-Administered Medications Ordered in Other Encounters  Medication Dose Route Frequency Provider Last Rate Last Dose  . sodium chloride 0.9 % injection 10 mL  10 mL Intracatheter PRN Pierce Crane, MD   10 mL at 12/25/11 1430    Physical Exam:   weight is 177 lb 11.2 oz (80.604 kg). Her oral temperature is 98.8 F  (37.1 C). Her blood pressure is 137/79 and her pulse is 91. Her respiration is 20.  She has some slight lymphedema notable in her fingers and wrists.  IMPRESSION: Rachael Weaver is a 61 y.o. female with triple positive right breast cancer status post lumpectomy  PLAN:  Rachael Weaver will be simulated today. We discussed the process of simulation the placement tattoos. We discussed 33 treatments as an outpatient. We discussed possible side effects of treatment including but not limited to skin redness and fatigue. We discussed possibility of lung and rib damage. She has signed informed consent and agree to proceed forward.    [name], MD

## 2011-12-31 NOTE — Progress Notes (Signed)
Name: Rachael Weaver   MRN: [id number]  Date:  12/31/2011  DOB: [birth year]  Status:outpatient    DIAGNOSIS: Breast cancer.  CONSENT VERIFIED: yes   SET UP: Patient is setup supine   IMMOBILIZATION:  The following immobilization was used:Custom Moldable Pillow, breast board.   NARRATIVE: [name] was brought to the CT Simulation planning suite.  Identity was confirmed.  All relevant records and images related to the planned course of therapy were reviewed.  Then, the patient was positioned in a stable reproducible clinical set-up for radiation therapy.  Wires were placed to delineate the clinical extent of breast tissue. A wire was placed on the scar as well.  CT images were obtained.  An isocenter was placed. Skin markings were placed.  The CT images were loaded into the planning software where the target and avoidance structures were contoured.  The radiation prescription was entered and confirmed. The patient was discharged in stable condition and tolerated simulation well.    TREATMENT PLANNING NOTE:  Treatment planning then occurred. I have requested : MLC's, isodose plan, basic dose calculation

## 2011-12-31 NOTE — Progress Notes (Signed)
Please see the Nurse Progress Note in the MD Initial Consult Encounter for this patient. 

## 2011-12-31 NOTE — Progress Notes (Signed)
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

## 2012-01-01 ENCOUNTER — Ambulatory Visit (HOSPITAL_BASED_OUTPATIENT_CLINIC_OR_DEPARTMENT_OTHER): Payer: Medicaid Other

## 2012-01-01 VITALS — BP 121/73 | HR 84 | Temp 98.3°F | Resp 20

## 2012-01-01 DIAGNOSIS — Z170 Estrogen receptor positive status [ER+]: Secondary | ICD-10-CM

## 2012-01-01 DIAGNOSIS — C50419 Malignant neoplasm of upper-outer quadrant of unspecified female breast: Secondary | ICD-10-CM

## 2012-01-01 DIAGNOSIS — C50919 Malignant neoplasm of unspecified site of unspecified female breast: Secondary | ICD-10-CM

## 2012-01-01 DIAGNOSIS — Z5112 Encounter for antineoplastic immunotherapy: Secondary | ICD-10-CM

## 2012-01-01 MED ORDER — TRASTUZUMAB CHEMO INJECTION 440 MG
2.0000 mg/kg | Freq: Once | INTRAVENOUS | Status: AC
  Administered 2012-01-01: 147 mg via INTRAVENOUS
  Filled 2012-01-01: qty 7

## 2012-01-01 MED ORDER — ACETAMINOPHEN 325 MG PO TABS
650.0000 mg | ORAL_TABLET | Freq: Once | ORAL | Status: AC
  Administered 2012-01-01: 650 mg via ORAL

## 2012-01-01 MED ORDER — SODIUM CHLORIDE 0.9 % IJ SOLN
10.0000 mL | INTRAMUSCULAR | Status: DC | PRN
  Administered 2012-01-01: 10 mL
  Filled 2012-01-01: qty 10

## 2012-01-01 MED ORDER — SODIUM CHLORIDE 0.9 % IV SOLN
Freq: Once | INTRAVENOUS | Status: AC
  Administered 2012-01-01: 14:00:00 via INTRAVENOUS

## 2012-01-01 MED ORDER — HEPARIN SOD (PORK) LOCK FLUSH 100 UNIT/ML IV SOLN
500.0000 [IU] | Freq: Once | INTRAVENOUS | Status: AC | PRN
  Administered 2012-01-01: 500 [IU]
  Filled 2012-01-01: qty 5

## 2012-01-01 MED ORDER — DIPHENHYDRAMINE HCL 50 MG/ML IJ SOLN
12.5000 mg | Freq: Once | INTRAMUSCULAR | Status: AC
  Administered 2012-01-01: 12.5 mg via INTRAVENOUS

## 2012-01-01 NOTE — Patient Instructions (Signed)
[facility] Discharge Instructions for Patients Receiving Chemotherapy  Today you received the following chemotherapy agents: herceptin  To help prevent nausea and vomiting after your treatment, we encourage you to take your nausea medication.  Take it as often as prescribed.     If you develop nausea and vomiting that is not controlled by your nausea medication, call the clinic. If it is after clinic hours your family physician or the after hours number for the clinic or go to the Emergency Department.   BELOW ARE SYMPTOMS THAT SHOULD BE REPORTED IMMEDIATELY:  *FEVER GREATER THAN 100.5 F  *CHILLS WITH OR WITHOUT FEVER  NAUSEA AND VOMITING THAT IS NOT CONTROLLED WITH YOUR NAUSEA MEDICATION  *UNUSUAL SHORTNESS OF BREATH  *UNUSUAL BRUISING OR BLEEDING  TENDERNESS IN MOUTH AND THROAT WITH OR WITHOUT PRESENCE OF ULCERS  *URINARY PROBLEMS  *BOWEL PROBLEMS  UNUSUAL RASH Items with * indicate a potential emergency and should be followed up as soon as possible.  Feel free to call the clinic you have any questions or concerns. The clinic phone number is [phone].   I have been informed and understand all the instructions given to me. I know to contact the clinic, my physician, or go to the Emergency Department if any problems should occur. I do not have any questions at this time, but understand that I may call the clinic during office hours   should I have any questions or need assistance in obtaining follow up care.    __________________________________________  _____________  __________ Signature of Patient or Authorized Representative            Date                   Time    __________________________________________ Nurse's Signature    

## 2012-01-07 ENCOUNTER — Other Ambulatory Visit: Payer: Self-pay | Admitting: Family

## 2012-01-07 ENCOUNTER — Ambulatory Visit
Admission: RE | Admit: 2012-01-07 | Discharge: 2012-01-07 | Disposition: A | Discharge status: Home / Self Care | Payer: Medicaid Other | Source: Ambulatory Visit | Attending: Radiation Oncology | Admitting: Radiation Oncology

## 2012-01-07 DIAGNOSIS — C50419 Malignant neoplasm of upper-outer quadrant of unspecified female breast: Secondary | ICD-10-CM

## 2012-01-07 NOTE — Progress Notes (Signed)
  Radiation Oncology         [phone] ________________________________  Name: Vila Dory MRN: [id number]  Date: 01/07/2012  DOB: 05/04/1950  Simulation Verification Note  Status: outpatient  NARRATIVE: The patient was brought to the treatment unit and placed in the planned treatment position. The clinical setup was verified. Then port films were obtained and uploaded to the radiation oncology medical record software.  The treatment beams were carefully compared against the planned radiation fields. The position location and shape of the radiation fields was reviewed. They targeted volume of tissue appears to be appropriately covered by the radiation beams. Organs at risk appear to be excluded as planned.  Based on my personal review, I approved the simulation verification. The patient's treatment will proceed as planned.  -----------------------------------  Billie Lade, PhD, MD

## 2012-01-08 ENCOUNTER — Ambulatory Visit (HOSPITAL_BASED_OUTPATIENT_CLINIC_OR_DEPARTMENT_OTHER): Payer: Medicaid Other | Admitting: Family

## 2012-01-08 ENCOUNTER — Other Ambulatory Visit (HOSPITAL_BASED_OUTPATIENT_CLINIC_OR_DEPARTMENT_OTHER): Payer: Medicaid Other | Admitting: Lab

## 2012-01-08 ENCOUNTER — Encounter: Payer: Self-pay | Admitting: Family

## 2012-01-08 ENCOUNTER — Ambulatory Visit (HOSPITAL_BASED_OUTPATIENT_CLINIC_OR_DEPARTMENT_OTHER): Payer: Medicaid Other

## 2012-01-08 ENCOUNTER — Ambulatory Visit
Admission: RE | Admit: 2012-01-08 | Discharge: 2012-01-08 | Disposition: A | Discharge status: Home / Self Care | Payer: Medicaid Other | Source: Ambulatory Visit | Attending: Radiation Oncology | Admitting: Radiation Oncology

## 2012-01-08 VITALS — BP 146/74 | HR 96 | Temp 99.0°F | Resp 20 | Ht 64.0 in | Wt 178.7 lb

## 2012-01-08 DIAGNOSIS — C50419 Malignant neoplasm of upper-outer quadrant of unspecified female breast: Secondary | ICD-10-CM

## 2012-01-08 DIAGNOSIS — Z170 Estrogen receptor positive status [ER+]: Secondary | ICD-10-CM

## 2012-01-08 DIAGNOSIS — Z5112 Encounter for antineoplastic immunotherapy: Secondary | ICD-10-CM

## 2012-01-08 DIAGNOSIS — C50919 Malignant neoplasm of unspecified site of unspecified female breast: Secondary | ICD-10-CM

## 2012-01-08 LAB — COMPREHENSIVE METABOLIC PANEL (CC13)
BUN: 15 mg/dL (ref 7.0–26.0)
CO2: 22 mEq/L (ref 22–29)
Calcium: 8.8 mg/dL (ref 8.4–10.4)
Chloride: 108 mEq/L — ABNORMAL HIGH (ref 98–107)
Creatinine: 0.9 mg/dL (ref 0.6–1.1)
Total Bilirubin: 0.6 mg/dL (ref 0.20–1.20)

## 2012-01-08 LAB — CBC WITH DIFFERENTIAL/PLATELET
BASO%: 1 % (ref 0.0–2.0)
EOS%: 0.3 % (ref 0.0–7.0)
Eosinophils Absolute: 0 10*3/uL (ref 0.0–0.5)
MCH: 35.1 pg — ABNORMAL HIGH (ref 25.1–34.0)
MCHC: 34 g/dL (ref 31.5–36.0)
MCV: 103.1 fL — ABNORMAL HIGH (ref 79.5–101.0)
MONO%: 11.6 % (ref 0.0–14.0)
NEUT#: 2.9 10*3/uL (ref 1.5–6.5)
RBC: 3.2 10*6/uL — ABNORMAL LOW (ref 3.70–5.45)
RDW: 15.9 % — ABNORMAL HIGH (ref 11.2–14.5)

## 2012-01-08 MED ORDER — SODIUM CHLORIDE 0.9 % IJ SOLN
10.0000 mL | INTRAMUSCULAR | Status: DC | PRN
  Administered 2012-01-08: 10 mL
  Filled 2012-01-08: qty 10

## 2012-01-08 MED ORDER — TRASTUZUMAB CHEMO INJECTION 440 MG
2.0000 mg/kg | Freq: Once | INTRAVENOUS | Status: AC
  Administered 2012-01-08: 147 mg via INTRAVENOUS
  Filled 2012-01-08: qty 7

## 2012-01-08 MED ORDER — ACETAMINOPHEN 325 MG PO TABS
650.0000 mg | ORAL_TABLET | Freq: Once | ORAL | Status: AC
  Administered 2012-01-08: 650 mg via ORAL

## 2012-01-08 MED ORDER — HEPARIN SOD (PORK) LOCK FLUSH 100 UNIT/ML IV SOLN
500.0000 [IU] | Freq: Once | INTRAVENOUS | Status: AC | PRN
  Administered 2012-01-08: 500 [IU]
  Filled 2012-01-08: qty 5

## 2012-01-08 MED ORDER — SODIUM CHLORIDE 0.9 % IV SOLN: Freq: Once | INTRAVENOUS | Status: DC

## 2012-01-08 NOTE — Progress Notes (Signed)
Hematology and Oncology Follow Up Visit  Rachael Weaver [id number] Feb 27, 1951 61 y.o. 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 Completed 6 cycles of Taxotere/Carbo, Herceptin will continue for one year.   Interim History:  Feels well and happy she has completed chemo. Says fatigue is improving. Had "fuzzy thinking" with chemo, she says is also improving. Has mild dyspnea with exertion, present since initiation of chemo. 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 Takes Vit. B complex sporadically. Has mild lymphedema, right arm, receiving physical therapy. No headache or blurred vision. No cough, no abdominal pain or new bone pain. Bowel and bladder function are normal. Appetite is good, with adequate fluid intake. Remainder of the 10 point  review of systems is negative.  Medications:   I have reviewed the patient's current medications.  Allergies: No Known Allergies  Physical Exam: Filed Vitals:   01/08/12 0922  BP: 146/74  Pulse: 96  Temp: 99 F (37.2 C)  Resp: 20    Body mass index is 30.67 kg/(m^2). Weight: 171 lbs. General: Well developed, well nourished, in no acute distress. Alone at today's visit.  EENT: No ocular or oral lesions. No stomatitis.  Respiratory: Lungs are clear to auscultation bilaterally with normal respiratory movement and no accessory muscle use. Cardiac: No murmur, rub or tachycardia. No upper or lower extremity edema.  GI: Abdomen is soft, no palpable hepatosplenomegaly. No fluid wave. No tenderness. Musculoskeletal: No kyphosis, no tenderness over the spine, ribs or hips. Lymph: No cervical,  infraclavicular, axillary or inguinal adenopathy. Neuro: No focal neurological deficits. Psych: Alert and oriented X 3, appropriate mood and affect.    Lab Results: Lab Results  Component Value Date   WBC 5.4 01/08/2012   HGB 11.2* 01/08/2012   HCT 33.0* 01/08/2012   MCV 103.1* 01/08/2012   PLT 143* 01/08/2012   NEUTROABS 2.9 01/08/2012    Assessment: 61 y/o female with: 1. Diagnosis of right breast cancer, completed chemo 12/18/11, now on Herceptin alone.  2. Sim has been completed, starting radiation therapy.  3. Good tolerance of Herceptin. Side effects from chemo abating.  4. Mild lymphedema, right arm, receiving PT.   Plan: 1. Proceed with radiation therapy. 2. Herceptin only today.  3. Return 9/19 and 9/26 for lab and Herceptin only. No office visit needed.  4. Appt with Dr. Donnie Coffin 10/2 and Herceptin treatment. 5. She will go to every 3 week dosing of Herceptin at the completion of radiation.     Colman Cater, FNP 01/08/2012

## 2012-01-08 NOTE — Patient Instructions (Addendum)
[facility] Discharge Instructions for Patients Receiving Chemotherapy  Today you received the following chemotherapy agents Herceptin.  If it is after clinic hours your family physician or the after hours number for the clinic or go to the Emergency Department.   BELOW ARE SYMPTOMS THAT SHOULD BE REPORTED IMMEDIATELY:  *FEVER GREATER THAN 100.5 F  *CHILLS WITH OR WITHOUT FEVER  NAUSEA AND VOMITING THAT IS NOT CONTROLLED WITH YOUR NAUSEA MEDICATION  *UNUSUAL SHORTNESS OF BREATH  *UNUSUAL BRUISING OR BLEEDING  TENDERNESS IN MOUTH AND THROAT WITH OR WITHOUT PRESENCE OF ULCERS  *URINARY PROBLEMS  *BOWEL PROBLEMS  UNUSUAL RASH Items with * indicate a potential emergency and should be followed up as soon as possible.  Feel free to call the clinic you have any questions or concerns. The clinic phone number is [phone].      

## 2012-01-08 NOTE — Patient Instructions (Addendum)
1. Proceed with radiation therapy. 2. Herceptin only today.  3. Return 9/19 and 9/26 for lab and Herceptin only. No office visit needed.  4. Appt with Dr. Donnie Coffin 10/2 and Herceptin treatment. 5. She will go to every 3 week dosing of Herceptin at the completion of radiation.

## 2012-01-09 ENCOUNTER — Ambulatory Visit
Admission: RE | Admit: 2012-01-09 | Discharge: 2012-01-09 | Disposition: A | Discharge status: Home / Self Care | Payer: Medicaid Other | Source: Ambulatory Visit | Attending: Radiation Oncology | Admitting: Radiation Oncology

## 2012-01-09 DIAGNOSIS — C50419 Malignant neoplasm of upper-outer quadrant of unspecified female breast: Secondary | ICD-10-CM

## 2012-01-09 NOTE — Progress Notes (Signed)
Education with Ms Markwardt for side -effect management related to radiation therapy to her breast.  Reviewed management of skin, fatigue, and pain.  Encouraged increased protein in diet to promote tissue cell repair in treatment field.  Informed that Dr. Michell Heinrich sill see her every Tuesday following treatment, but if the schedule changes she will be notified in advance.

## 2012-01-09 NOTE — Addendum Note (Signed)
Encounter addended by: Delynn Flavin, RN on: 01/09/2012  5:41 PM<BR>     Documentation filed: Notes Section, Inpatient Patient Education

## 2012-01-12 ENCOUNTER — Ambulatory Visit
Admission: RE | Admit: 2012-01-12 | Discharge: 2012-01-12 | Disposition: A | Discharge status: Home / Self Care | Payer: Medicaid Other | Source: Ambulatory Visit | Attending: Radiation Oncology | Admitting: Radiation Oncology

## 2012-01-13 ENCOUNTER — Ambulatory Visit: Payer: Medicaid Other

## 2012-01-14 ENCOUNTER — Ambulatory Visit
Admission: RE | Admit: 2012-01-14 | Discharge: 2012-01-14 | Disposition: A | Discharge status: Home / Self Care | Payer: Medicaid Other | Source: Ambulatory Visit | Attending: Radiation Oncology | Admitting: Radiation Oncology

## 2012-01-14 ENCOUNTER — Encounter: Payer: Self-pay | Admitting: Radiation Oncology

## 2012-01-14 ENCOUNTER — Ambulatory Visit: Payer: Medicaid Other | Attending: Oncology

## 2012-01-14 VITALS — BP 134/82 | HR 123 | Resp 18 | Wt 175.8 lb

## 2012-01-14 DIAGNOSIS — C50419 Malignant neoplasm of upper-outer quadrant of unspecified female breast: Secondary | ICD-10-CM

## 2012-01-14 DIAGNOSIS — IMO0001 Reserved for inherently not codable concepts without codable children: Secondary | ICD-10-CM | POA: Insufficient documentation

## 2012-01-14 DIAGNOSIS — I890 Lymphedema, not elsewhere classified: Secondary | ICD-10-CM | POA: Insufficient documentation

## 2012-01-14 DIAGNOSIS — M25619 Stiffness of unspecified shoulder, not elsewhere classified: Secondary | ICD-10-CM | POA: Insufficient documentation

## 2012-01-14 NOTE — Progress Notes (Signed)
Patient presents to the clinic today unaccompanied for PUT with Dr. Michell Heinrich. Patient is alert and oriented to person, place, and time. No distress noted. Steady gait noted. Pleasant affect noted. Patient denies breast pain at this moment. However, patient reports occasional burning or aching pain in her right breast. Patient denies nipple discharge. Patient reports discoloration around right nipple persist. Patient reports using radiaplex gel as directed. Patient has no complaints at this time. Reported all findings to Dr. Michell Heinrich.

## 2012-01-14 NOTE — Progress Notes (Signed)
Weekly Management Note Current Dose:  5.4 Gy  Projected Dose: 61 Gy   Narrative:  The patient presents for routine under treatment assessment.  CBCT/MVCT images/Port film x-rays were reviewed.  The chart was checked. Doing well. Nipple still pink.   Physical Findings: Weight: 175 lb 12.8 oz (79.742 kg). Unchanged  Impression:  The patient is tolerating radiation.  Plan:  Continue treatment as planned. Continue radiaplex.

## 2012-01-15 ENCOUNTER — Ambulatory Visit (HOSPITAL_BASED_OUTPATIENT_CLINIC_OR_DEPARTMENT_OTHER): Payer: Medicaid Other | Admitting: Lab

## 2012-01-15 ENCOUNTER — Ambulatory Visit
Admission: RE | Admit: 2012-01-15 | Discharge: 2012-01-15 | Disposition: A | Discharge status: Home / Self Care | Payer: Medicaid Other | Source: Ambulatory Visit | Attending: Radiation Oncology | Admitting: Radiation Oncology

## 2012-01-15 ENCOUNTER — Ambulatory Visit (HOSPITAL_BASED_OUTPATIENT_CLINIC_OR_DEPARTMENT_OTHER): Payer: Medicaid Other

## 2012-01-15 VITALS — BP 136/71 | HR 86 | Temp 98.3°F | Resp 20

## 2012-01-15 DIAGNOSIS — C50919 Malignant neoplasm of unspecified site of unspecified female breast: Secondary | ICD-10-CM

## 2012-01-15 DIAGNOSIS — C50419 Malignant neoplasm of upper-outer quadrant of unspecified female breast: Secondary | ICD-10-CM

## 2012-01-15 DIAGNOSIS — Z5112 Encounter for antineoplastic immunotherapy: Secondary | ICD-10-CM

## 2012-01-15 LAB — CBC WITH DIFFERENTIAL/PLATELET
Basophils Absolute: 0 10*3/uL (ref 0.0–0.1)
Eosinophils Absolute: 0.3 10*3/uL (ref 0.0–0.5)
HCT: 34.9 % (ref 34.8–46.6)
HGB: 11.5 g/dL — ABNORMAL LOW (ref 11.6–15.9)
MONO#: 0.5 10*3/uL (ref 0.1–0.9)
NEUT%: 47.8 % (ref 38.4–76.8)
Platelets: 269 10*3/uL (ref 145–400)
WBC: 4.6 10*3/uL (ref 3.9–10.3)
lymph#: 1.7 10*3/uL (ref 0.9–3.3)

## 2012-01-15 MED ORDER — TRASTUZUMAB CHEMO INJECTION 440 MG
2.0000 mg/kg | Freq: Once | INTRAVENOUS | Status: AC
  Administered 2012-01-15: 147 mg via INTRAVENOUS
  Filled 2012-01-15: qty 7

## 2012-01-15 MED ORDER — SODIUM CHLORIDE 0.9 % IV SOLN
Freq: Once | INTRAVENOUS | Status: AC
  Administered 2012-01-15: 11:00:00 via INTRAVENOUS

## 2012-01-15 MED ORDER — ACETAMINOPHEN 325 MG PO TABS
650.0000 mg | ORAL_TABLET | Freq: Once | ORAL | Status: AC
  Administered 2012-01-15: 650 mg via ORAL

## 2012-01-15 MED ORDER — SODIUM CHLORIDE 0.9 % IJ SOLN
10.0000 mL | INTRAMUSCULAR | Status: DC | PRN
  Administered 2012-01-15: 10 mL
  Filled 2012-01-15: qty 10

## 2012-01-15 MED ORDER — HEPARIN SOD (PORK) LOCK FLUSH 100 UNIT/ML IV SOLN
500.0000 [IU] | Freq: Once | INTRAVENOUS | Status: AC | PRN
  Administered 2012-01-15: 500 [IU]
  Filled 2012-01-15: qty 5

## 2012-01-15 NOTE — Patient Instructions (Addendum)
[facility] Discharge Instructions for Patients Receiving Chemotherapy  Today you received the following chemotherapy agent Herceptin.  To help prevent nausea and vomiting after your treatment, we encourage you to take your nausea medication. Begin taking your nausea medication as often as prescribed for by Dr. Rubin.    If you develop nausea and vomiting that is not controlled by your nausea medication, call the clinic. If it is after clinic hours your family physician or the after hours number for the clinic or go to the Emergency Department.   BELOW ARE SYMPTOMS THAT SHOULD BE REPORTED IMMEDIATELY:  *FEVER GREATER THAN 100.5 F  *CHILLS WITH OR WITHOUT FEVER  NAUSEA AND VOMITING THAT IS NOT CONTROLLED WITH YOUR NAUSEA MEDICATION  *UNUSUAL SHORTNESS OF BREATH  *UNUSUAL BRUISING OR BLEEDING  TENDERNESS IN MOUTH AND THROAT WITH OR WITHOUT PRESENCE OF ULCERS  *URINARY PROBLEMS  *BOWEL PROBLEMS  UNUSUAL RASH Items with * indicate a potential emergency and should be followed up as soon as possible.  One of the nurses will contact you 24 hours after your treatment. Please let the nurse know about any problems that you may have experienced. Feel free to call the clinic you have any questions or concerns. The clinic phone number is [phone].   I have been informed and understand all the instructions given to me. I know to contact the clinic, my physician, or go to the Emergency Department if any problems should occur. I do not have any questions at this time, but understand that I may call the clinic during office hours   should I have any questions or need assistance in obtaining follow up care.    __________________________________________  _____________  __________ Signature of Patient or Authorized Representative            Date                   Time    __________________________________________ Nurse's Signature    

## 2012-01-15 NOTE — Patient Instructions (Addendum)
[facility] Discharge Instructions for Patients Receiving Chemotherapy  Today you received the following chemotherapy agents Herceptin To help prevent nausea and vomiting after your treatment, we encourage you to take your nausea medication as prescribed.  If you develop nausea and vomiting that is not controlled by your nausea medication, call the clinic. If it is after clinic hours your family physician or the after hours number for the clinic or go to the Emergency Department.   BELOW ARE SYMPTOMS THAT SHOULD BE REPORTED IMMEDIATELY:  *FEVER GREATER THAN 100.5 F  *CHILLS WITH OR WITHOUT FEVER  NAUSEA AND VOMITING THAT IS NOT CONTROLLED WITH YOUR NAUSEA MEDICATION  *UNUSUAL SHORTNESS OF BREATH  *UNUSUAL BRUISING OR BLEEDING  TENDERNESS IN MOUTH AND THROAT WITH OR WITHOUT PRESENCE OF ULCERS  *URINARY PROBLEMS  *BOWEL PROBLEMS  UNUSUAL RASH Items with * indicate a potential emergency and should be followed up as soon as possible.  One of the nurses will contact you 24 hours after your treatment. Please let the nurse know about any problems that you may have experienced. Feel free to call the clinic you have any questions or concerns. The clinic phone number is [phone].   I have been informed and understand all the instructions given to me. I know to contact the clinic, my physician, or go to the Emergency Department if any problems should occur. I do not have any questions at this time, but understand that I may call the clinic during office hours   should I have any questions or need assistance in obtaining follow up care.    __________________________________________  _____________  __________ Signature of Patient or Authorized Representative            Date                   Time    __________________________________________ Nurse's Signature    

## 2012-01-16 ENCOUNTER — Ambulatory Visit
Admission: RE | Admit: 2012-01-16 | Discharge: 2012-01-16 | Disposition: A | Discharge status: Home / Self Care | Payer: Medicaid Other | Source: Ambulatory Visit | Attending: Radiation Oncology | Admitting: Radiation Oncology

## 2012-01-19 ENCOUNTER — Ambulatory Visit: Payer: Medicaid Other

## 2012-01-19 ENCOUNTER — Ambulatory Visit
Admission: RE | Admit: 2012-01-19 | Discharge: 2012-01-19 | Disposition: A | Discharge status: Home / Self Care | Payer: Medicaid Other | Source: Ambulatory Visit | Attending: Radiation Oncology | Admitting: Radiation Oncology

## 2012-01-20 ENCOUNTER — Encounter: Payer: Self-pay | Admitting: Radiation Oncology

## 2012-01-20 ENCOUNTER — Ambulatory Visit
Admission: RE | Admit: 2012-01-20 | Discharge: 2012-01-20 | Disposition: A | Discharge status: Home / Self Care | Payer: Medicaid Other | Source: Ambulatory Visit | Attending: Radiation Oncology | Admitting: Radiation Oncology

## 2012-01-20 VITALS — BP 130/86 | HR 85 | Resp 18 | Wt 174.8 lb

## 2012-01-20 DIAGNOSIS — C50419 Malignant neoplasm of upper-outer quadrant of unspecified female breast: Secondary | ICD-10-CM

## 2012-01-20 NOTE — Progress Notes (Signed)
Patient presents to the clinic today unaccompanied for a PUT with Dr. Michell Heinrich. Patient is alert and oriented to person, place, and time. No distress noted. Steady gait noted. Pleasant affect noted. Patient reports that yesterday following treatment her breast felt warm. Also, she reports she noted faint hyperpigmentation during the night. Patient reports using Radiaplex as directed. Patient reports the pinkish tint of her right breast was gone this morning when she woke up. Patient has no complaints at this time. Patient reports her next round of herceptin is Thursday. Reported all finding to Dr. Michell Heinrich.

## 2012-01-20 NOTE — Progress Notes (Signed)
Weekly Management Note Current Dose:   Gy  Projected Dose:  Gy   Narrative:  The patient presents for routine under treatment assessment.  CBCT/MVCT images/Port film x-rays were reviewed.  The chart was checked. Had pink tint and pain of breast last night. Using radiaplex and aloe. Gone this morning. No fevers or chills.   Physical Findings: Weight: 174 lb 12.8 oz (79.289 kg). Unchanged. Nipple is pink but this is unchanged.  Impression:  The patient is tolerating radiation.  Plan:  Continue treatment as planned. Continue radiaplex.

## 2012-01-21 ENCOUNTER — Ambulatory Visit
Admission: RE | Admit: 2012-01-21 | Discharge: 2012-01-21 | Disposition: A | Discharge status: Home / Self Care | Payer: Medicaid Other | Source: Ambulatory Visit | Attending: Radiation Oncology | Admitting: Radiation Oncology

## 2012-01-22 ENCOUNTER — Other Ambulatory Visit (HOSPITAL_BASED_OUTPATIENT_CLINIC_OR_DEPARTMENT_OTHER): Payer: Medicaid Other | Admitting: Lab

## 2012-01-22 ENCOUNTER — Ambulatory Visit
Admission: RE | Admit: 2012-01-22 | Discharge: 2012-01-22 | Disposition: A | Discharge status: Home / Self Care | Payer: Medicaid Other | Source: Ambulatory Visit | Attending: Radiation Oncology | Admitting: Radiation Oncology

## 2012-01-22 ENCOUNTER — Ambulatory Visit (HOSPITAL_BASED_OUTPATIENT_CLINIC_OR_DEPARTMENT_OTHER): Payer: Medicaid Other

## 2012-01-22 VITALS — BP 140/85 | HR 76 | Temp 98.0°F

## 2012-01-22 DIAGNOSIS — C50419 Malignant neoplasm of upper-outer quadrant of unspecified female breast: Secondary | ICD-10-CM

## 2012-01-22 DIAGNOSIS — Z5112 Encounter for antineoplastic immunotherapy: Secondary | ICD-10-CM

## 2012-01-22 DIAGNOSIS — C50919 Malignant neoplasm of unspecified site of unspecified female breast: Secondary | ICD-10-CM

## 2012-01-22 LAB — CBC WITH DIFFERENTIAL/PLATELET
Basophils Absolute: 0 10*3/uL (ref 0.0–0.1)
EOS%: 5.2 % (ref 0.0–7.0)
Eosinophils Absolute: 0.2 10*3/uL (ref 0.0–0.5)
HCT: 37.6 % (ref 34.8–46.6)
HGB: 12.5 g/dL (ref 11.6–15.9)
MCH: 33.8 pg (ref 25.1–34.0)
NEUT#: 2 10*3/uL (ref 1.5–6.5)
NEUT%: 45.1 % (ref 38.4–76.8)
RDW: 13.2 % (ref 11.2–14.5)
lymph#: 1.7 10*3/uL (ref 0.9–3.3)

## 2012-01-22 MED ORDER — TRASTUZUMAB CHEMO INJECTION 440 MG
2.0000 mg/kg | Freq: Once | INTRAVENOUS | Status: AC
  Administered 2012-01-22: 147 mg via INTRAVENOUS
  Filled 2012-01-22: qty 7

## 2012-01-22 MED ORDER — ACETAMINOPHEN 325 MG PO TABS
650.0000 mg | ORAL_TABLET | Freq: Once | ORAL | Status: AC
  Administered 2012-01-22: 650 mg via ORAL

## 2012-01-22 MED ORDER — SODIUM CHLORIDE 0.9 % IV SOLN
Freq: Once | INTRAVENOUS | Status: AC
  Administered 2012-01-22: 10:00:00 via INTRAVENOUS

## 2012-01-22 MED ORDER — HEPARIN SOD (PORK) LOCK FLUSH 100 UNIT/ML IV SOLN
500.0000 [IU] | Freq: Once | INTRAVENOUS | Status: AC | PRN
  Administered 2012-01-22: 500 [IU]
  Filled 2012-01-22: qty 5

## 2012-01-22 MED ORDER — SODIUM CHLORIDE 0.9 % IJ SOLN
10.0000 mL | INTRAMUSCULAR | Status: DC | PRN
  Administered 2012-01-22: 10 mL
  Filled 2012-01-22: qty 10

## 2012-01-22 NOTE — Patient Instructions (Addendum)
[facility] Discharge Instructions for Patients Receiving Chemotherapy  Today you received the following chemotherapy agents Herceptin.  To help prevent nausea and vomiting after your treatment, we encourage you to take your nausea medication as prescribed.    If you develop nausea and vomiting that is not controlled by your nausea medication, call the clinic. If it is after clinic hours your family physician or the after hours number for the clinic or go to the Emergency Department.   BELOW ARE SYMPTOMS THAT SHOULD BE REPORTED IMMEDIATELY:  *FEVER GREATER THAN 100.5 F  *CHILLS WITH OR WITHOUT FEVER  NAUSEA AND VOMITING THAT IS NOT CONTROLLED WITH YOUR NAUSEA MEDICATION  *UNUSUAL SHORTNESS OF BREATH  *UNUSUAL BRUISING OR BLEEDING  TENDERNESS IN MOUTH AND THROAT WITH OR WITHOUT PRESENCE OF ULCERS  *URINARY PROBLEMS  *BOWEL PROBLEMS  UNUSUAL RASH Items with * indicate a potential emergency and should be followed up as soon as possible.   Please let the nurse know about any problems that you may have experienced. Feel free to call the clinic you have any questions or concerns. The clinic phone number is [phone].   I have been informed and understand all the instructions given to me. I know to contact the clinic, my physician, or go to the Emergency Department if any problems should occur. I do not have any questions at this time, but understand that I may call the clinic during office hours   should I have any questions or need assistance in obtaining follow up care.    __________________________________________  _____________  __________ Signature of Patient or Authorized Representative            Date                   Time    __________________________________________ Nurse's Signature    

## 2012-01-23 ENCOUNTER — Ambulatory Visit
Admission: RE | Admit: 2012-01-23 | Discharge: 2012-01-23 | Disposition: A | Discharge status: Home / Self Care | Payer: Medicaid Other | Source: Ambulatory Visit | Attending: Radiation Oncology | Admitting: Radiation Oncology

## 2012-01-26 ENCOUNTER — Ambulatory Visit
Admission: RE | Admit: 2012-01-26 | Discharge: 2012-01-26 | Disposition: A | Discharge status: Home / Self Care | Payer: Medicaid Other | Source: Ambulatory Visit | Attending: Radiation Oncology | Admitting: Radiation Oncology

## 2012-01-26 NOTE — Addendum Note (Signed)
Encounter addended by: Saben Donigan Mintz Takaya Hyslop, RN on: 01/26/2012  8:20 PM<BR>     Documentation filed: Charges VN

## 2012-01-27 ENCOUNTER — Ambulatory Visit
Admission: RE | Admit: 2012-01-27 | Discharge: 2012-01-27 | Disposition: A | Discharge status: Home / Self Care | Payer: Medicaid Other | Source: Ambulatory Visit | Attending: Radiation Oncology | Admitting: Radiation Oncology

## 2012-01-27 VITALS — BP 136/79 | HR 78 | Resp 18 | Wt 172.6 lb

## 2012-01-27 DIAGNOSIS — C50419 Malignant neoplasm of upper-outer quadrant of unspecified female breast: Secondary | ICD-10-CM

## 2012-01-27 NOTE — Progress Notes (Signed)
Patient presents to the clinic today unaccompanied for PUT with Dr. Michell Heinrich. Patient is alert and oriented to person, place, and time. No distress noted. Steady gait noted. Pleasant affect noted. Patient denies pain at this time. Faint hyperpigmentation without desquamation of right/treated breast noted. Patient reports that she continues to use Radiaplex as directed. Radiation dermatitis of right chest wall noted encouraged patient to apply hydrocortisone to this area. Patient verbalized understanding. Patient reports her energy level is slowly returning and that she is not as easily winded now when she exerts herself. Reported all findings to Dr. Michell Heinrich.

## 2012-01-27 NOTE — Progress Notes (Signed)
Weekly Management Note Current Dose: 23.4  Gy  Projected Dose: 61 Gy   Narrative:  The patient presents for routine under treatment assessment.  CBCT/MVCT images/Port film x-rays were reviewed.  The chart was checked. Doing well. Some dermatitis medially. Working full time.   Physical Findings: Weight: 172 lb 9.6 oz (78.291 kg). Unchanged. Dermatitis in upper innner quadrant.   Impression:  The patient is tolerating radiation.  Plan:  Continue treatment as planned. Continue radiaplex.

## 2012-01-28 ENCOUNTER — Other Ambulatory Visit (HOSPITAL_BASED_OUTPATIENT_CLINIC_OR_DEPARTMENT_OTHER): Payer: Medicaid Other | Admitting: Lab

## 2012-01-28 ENCOUNTER — Ambulatory Visit
Admission: RE | Admit: 2012-01-28 | Discharge: 2012-01-28 | Disposition: A | Discharge status: Home / Self Care | Payer: Medicaid Other | Source: Ambulatory Visit | Attending: Radiation Oncology | Admitting: Radiation Oncology

## 2012-01-28 ENCOUNTER — Ambulatory Visit (HOSPITAL_BASED_OUTPATIENT_CLINIC_OR_DEPARTMENT_OTHER): Payer: Medicaid Other | Admitting: Oncology

## 2012-01-28 ENCOUNTER — Telehealth: Payer: Self-pay | Admitting: *Deleted

## 2012-01-28 ENCOUNTER — Ambulatory Visit (HOSPITAL_BASED_OUTPATIENT_CLINIC_OR_DEPARTMENT_OTHER): Payer: Medicaid Other

## 2012-01-28 VITALS — BP 141/87 | HR 91 | Temp 98.9°F | Resp 20 | Ht 64.0 in | Wt 173.1 lb

## 2012-01-28 DIAGNOSIS — C50419 Malignant neoplasm of upper-outer quadrant of unspecified female breast: Secondary | ICD-10-CM

## 2012-01-28 DIAGNOSIS — Z170 Estrogen receptor positive status [ER+]: Secondary | ICD-10-CM

## 2012-01-28 DIAGNOSIS — Z5112 Encounter for antineoplastic immunotherapy: Secondary | ICD-10-CM

## 2012-01-28 DIAGNOSIS — C50919 Malignant neoplasm of unspecified site of unspecified female breast: Secondary | ICD-10-CM

## 2012-01-28 DIAGNOSIS — I890 Lymphedema, not elsewhere classified: Secondary | ICD-10-CM

## 2012-01-28 LAB — CBC WITH DIFFERENTIAL/PLATELET
BASO%: 0.5 % (ref 0.0–2.0)
EOS%: 4.2 % (ref 0.0–7.0)
LYMPH%: 37.1 % (ref 14.0–49.7)
MCHC: 33.2 g/dL (ref 31.5–36.0)
MCV: 100.5 fL (ref 79.5–101.0)
MONO%: 13.4 % (ref 0.0–14.0)
Platelets: 190 10*3/uL (ref 145–400)
RBC: 3.74 10*6/uL (ref 3.70–5.45)
nRBC: 0 % (ref 0–0)

## 2012-01-28 MED ORDER — ACETAMINOPHEN 325 MG PO TABS
650.0000 mg | ORAL_TABLET | Freq: Once | ORAL | Status: AC
  Administered 2012-01-28: 650 mg via ORAL

## 2012-01-28 MED ORDER — HEPARIN SOD (PORK) LOCK FLUSH 100 UNIT/ML IV SOLN
500.0000 [IU] | Freq: Once | INTRAVENOUS | Status: AC | PRN
  Administered 2012-01-28: 500 [IU]
  Filled 2012-01-28: qty 5

## 2012-01-28 MED ORDER — SODIUM CHLORIDE 0.9 % IV SOLN
Freq: Once | INTRAVENOUS | Status: AC
  Administered 2012-01-28: 13:00:00 via INTRAVENOUS

## 2012-01-28 MED ORDER — SODIUM CHLORIDE 0.9 % IJ SOLN
10.0000 mL | INTRAMUSCULAR | Status: DC | PRN
  Administered 2012-01-28: 10 mL
  Filled 2012-01-28: qty 10

## 2012-01-28 MED ORDER — TRASTUZUMAB CHEMO INJECTION 440 MG
2.0000 mg/kg | Freq: Once | INTRAVENOUS | Status: AC
  Administered 2012-01-28: 147 mg via INTRAVENOUS
  Filled 2012-01-28: qty 7

## 2012-01-28 NOTE — Progress Notes (Signed)
Hematology and Oncology Follow Up Visit  Rachael Weaver [id number] July 22, 1950 61 y.o. 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 Completed 6 cycles of Taxotere/Carbo, Herceptin will continue for one year, started 5/13.  Interim History:  She is currently in the midst of radiation , till the end of the month.she will receive herceptin today. She is doing well without complaints.  Medications:   I have reviewed the patient's current medications.  Allergies: No Known Allergies  Physical Exam: Filed Vitals:   01/28/12 1109  BP: 141/87  Pulse: 91  Temp: 98.9 F (37.2 C)  Resp: 20    Body mass index is 29.71 kg/(m^2). Weight: 171 lbs. General: Well developed, well nourished, in no acute distress. Alone at today's visit.  EENT: No ocular or oral lesions. No stomatitis.  Respiratory: Lungs are clear to auscultation bilaterally with normal respiratory movement and no accessory muscle use. Cardiac: No murmur, rub or tachycardia. No upper or lower extremity edema.  GI: Abdomen is soft, no palpable hepatosplenomegaly. No fluid wave. No tenderness. Musculoskeletal: No kyphosis, no tenderness over the spine, ribs or hips. Lymph: No cervical, infraclavicular, axillary or inguinal adenopathy. Neuro: No focal neurological deficits. Psych: Alert and oriented X 3, appropriate mood and affect.    Lab Results: Lab Results  Component Value Date   WBC 3.8* 01/28/2012   HGB 12.5 01/28/2012   HCT 37.6 01/28/2012   MCV 100.5 01/28/2012   PLT 190 01/28/2012   NEUTROABS 1.7 01/28/2012    Assessment: 62 y/o female with: 1. Diagnosis of right breast cancer, completed chemo 12/18/11, now on  Herceptin alone.  2. Sim has been completed, starting radiation therapy.  3. Good tolerance of Herceptin. Side effects from chemo abating.  4. Mild lymphedema, right arm, receiving PT.   Plan: Herceptin weekly till end of the month then q 3 weeks.  2d echo at end of the month.    Rachael Weaver,PETER10/05/2011

## 2012-01-28 NOTE — Patient Instructions (Addendum)
[facility] Discharge Instructions for Patients Receiving Chemotherapy  Today you received the following agents: Herceptin.   If you develop nausea and vomiting that is not controlled by your nausea medication, call the clinic. If it is after clinic hours your family physician or the after hours number for the clinic or go to the Emergency Department.   BELOW ARE SYMPTOMS THAT SHOULD BE REPORTED IMMEDIATELY:  *FEVER GREATER THAN 100.5 F  *CHILLS WITH OR WITHOUT FEVER  NAUSEA AND VOMITING THAT IS NOT CONTROLLED WITH YOUR NAUSEA MEDICATION  *UNUSUAL SHORTNESS OF BREATH  *UNUSUAL BRUISING OR BLEEDING  TENDERNESS IN MOUTH AND THROAT WITH OR WITHOUT PRESENCE OF ULCERS  *URINARY PROBLEMS  *BOWEL PROBLEMS  UNUSUAL RASH Items with * indicate a potential emergency and should be followed up as soon as possible.  Feel free to call the clinic you have any questions or concerns. The clinic phone number is [phone].   I have been informed and understand all the instructions given to me. I know to contact the clinic, my physician, or go to the Emergency Department if any problems should occur. I do not have any questions at this time, but understand that I may call the clinic during office hours   should I have any questions or need assistance in obtaining follow up care.    __________________________________________  _____________  __________ Signature of Patient or Authorized Representative            Date                   Time    __________________________________________ Nurse's Signature

## 2012-01-28 NOTE — Progress Notes (Signed)
Hematology and Oncology Follow Up Visit  Aleila Syverson [id number] Aug 17, 1950 61 y.o. 01/28/2012    HPI: Ms. 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 Completed 6 cycles of Taxotere/Carbo, Herceptin will continue for one year. On xrt , due to complete 10/29.  Interim History:  Feels well and happy she has completed chemo. Says fatigue is improving. Had "fuzzy thinking" with chemo, she says is also improving. Has mild dyspnea with exertion, present since initiation of chemo. 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 Takes Vit. B complex sporadically. Has mild lymphedema, right arm, receiving physical therapy. No headache or blurred vision. No cough, no abdominal pain or new bone pain. Bowel and bladder function are normal. Appetite is good, with adequate fluid intake. Remainder of the 10 point  review of systems is negative.  Medications:   I have reviewed the patient's current medications.  Allergies: No Known Allergies  Physical Exam: Filed Vitals:   01/28/12 1109  BP: 141/87  Pulse: 91  Temp: 98.9 F (37.2 C)  Resp: 20    Body mass index is 29.71 kg/(m^2). Weight: 171 lbs. General: Well developed, well nourished, in no acute distress. Alone at today's visit.  EENT: No ocular or oral lesions. No stomatitis.  Respiratory: Lungs are clear to auscultation bilaterally with normal respiratory movement and no accessory muscle use. Cardiac: No murmur, rub or tachycardia. No upper or lower extremity edema.  GI: Abdomen is soft, no palpable hepatosplenomegaly. No fluid wave. No tenderness. Musculoskeletal: No kyphosis, no tenderness over the spine, ribs or  hips. Lymph: No cervical, infraclavicular, axillary or inguinal adenopathy. Neuro: No focal neurological deficits. Psych: Alert and oriented X 3, appropriate mood and affect.    Lab Results: Lab Results  Component Value Date   WBC 3.8* 01/28/2012   HGB 12.5 01/28/2012   HCT 37.6 01/28/2012   MCV 100.5 01/28/2012   PLT 190 01/28/2012   NEUTROABS 1.7 01/28/2012    Assessment: 61 y/o female with: 1. Diagnosis of right breast cancer, completed chemo 12/18/11, now on Herceptin alone.   3. Good tolerance of Herceptin. Side effects from chemo abating.  4. Mild lymphedema, right arm, receiving PT.   Plan:  1. Herceptin only today.   will begin q 3week herceptin after 10/23 dose    Padme Arriaga,  01/28/2012

## 2012-01-28 NOTE — Telephone Encounter (Signed)
Per staff message and POF I have scheduled appts.  JMW  

## 2012-01-29 ENCOUNTER — Ambulatory Visit
Admission: RE | Admit: 2012-01-29 | Discharge: 2012-01-29 | Disposition: A | Discharge status: Home / Self Care | Payer: Medicaid Other | Source: Ambulatory Visit | Attending: Radiation Oncology | Admitting: Radiation Oncology

## 2012-01-30 ENCOUNTER — Ambulatory Visit
Admission: RE | Admit: 2012-01-30 | Discharge: 2012-01-30 | Disposition: A | Discharge status: Home / Self Care | Payer: Medicaid Other | Source: Ambulatory Visit | Attending: Radiation Oncology | Admitting: Radiation Oncology

## 2012-02-02 ENCOUNTER — Ambulatory Visit
Admission: RE | Admit: 2012-02-02 | Discharge: 2012-02-02 | Disposition: A | Discharge status: Home / Self Care | Payer: Medicaid Other | Source: Ambulatory Visit | Attending: Radiation Oncology | Admitting: Radiation Oncology

## 2012-02-03 ENCOUNTER — Encounter: Payer: Self-pay | Admitting: Radiation Oncology

## 2012-02-03 ENCOUNTER — Ambulatory Visit
Admission: RE | Admit: 2012-02-03 | Discharge: 2012-02-03 | Disposition: A | Discharge status: Home / Self Care | Payer: Medicaid Other | Source: Ambulatory Visit | Attending: Radiation Oncology | Admitting: Radiation Oncology

## 2012-02-03 VITALS — BP 139/76 | HR 100 | Resp 18 | Wt 173.1 lb

## 2012-02-03 DIAGNOSIS — C50419 Malignant neoplasm of upper-outer quadrant of unspecified female breast: Secondary | ICD-10-CM

## 2012-02-03 MED ORDER — BIAFINE EX EMUL
Freq: Once | CUTANEOUS | Status: AC
  Administered 2012-02-03: 13:00:00 via TOPICAL

## 2012-02-03 NOTE — Addendum Note (Signed)
Encounter addended by: Agnes Lawrence, RN on: 02/03/2012  1:18 PM<BR>     Documentation filed: Inpatient MAR, Orders

## 2012-02-03 NOTE — Progress Notes (Signed)
Weekly Management Note Current Dose: 32.4  Gy  Projected Dose: 60.4 Gy   Narrative:  The patient presents for routine under treatment assessment.  CBCT/MVCT images/Port film x-rays were reviewed.  The chart was checked. Doing well. Asked about plastic surgery referral. Using radiaplex with more itching.   Physical Findings: Weight: 173 lb 1.6 oz (78.518 kg). Dermatitis over anterior superior aspect of the breast. Inframammary folds clean.  Impression:  The patient is tolerating radiation.  Plan:  Continue treatment as planned. Switch to biafene.  Discussed we would be happy to make a referral to plastics when she was ready.

## 2012-02-03 NOTE — Progress Notes (Signed)
Patient presents to the clinic today unaccompanied for PUT with Dr. Michell Heinrich. Patient is alert and oriented to person, place, and time. No distress noted. Steady gait noted. Pleasant affect noted. Patient denies breast pain at this time. Patient reports hyperpigmentation without desquamation of right/treated breast. Radiation dermatitis noted. Patient reports using hydrocortisone and radiaplex as directed. Patient needs another tube of lotion. Will provided patient with lotion Dr. Michell Heinrich recommends. Patient reports that she walked a mile and a half this morning. Reported all findings to Dr. Michell Heinrich.

## 2012-02-04 ENCOUNTER — Other Ambulatory Visit (HOSPITAL_BASED_OUTPATIENT_CLINIC_OR_DEPARTMENT_OTHER): Payer: Medicaid Other | Admitting: Lab

## 2012-02-04 ENCOUNTER — Ambulatory Visit (HOSPITAL_BASED_OUTPATIENT_CLINIC_OR_DEPARTMENT_OTHER): Payer: Medicaid Other

## 2012-02-04 ENCOUNTER — Ambulatory Visit
Admission: RE | Admit: 2012-02-04 | Discharge: 2012-02-04 | Disposition: A | Discharge status: Home / Self Care | Payer: Medicaid Other | Source: Ambulatory Visit | Attending: Radiation Oncology | Admitting: Radiation Oncology

## 2012-02-04 DIAGNOSIS — Z170 Estrogen receptor positive status [ER+]: Secondary | ICD-10-CM

## 2012-02-04 DIAGNOSIS — C50919 Malignant neoplasm of unspecified site of unspecified female breast: Secondary | ICD-10-CM

## 2012-02-04 DIAGNOSIS — C50419 Malignant neoplasm of upper-outer quadrant of unspecified female breast: Secondary | ICD-10-CM

## 2012-02-04 DIAGNOSIS — Z5112 Encounter for antineoplastic immunotherapy: Secondary | ICD-10-CM

## 2012-02-04 LAB — CBC WITH DIFFERENTIAL/PLATELET
Eosinophils Absolute: 0.2 10*3/uL (ref 0.0–0.5)
HCT: 38.9 % (ref 34.8–46.6)
LYMPH%: 31.7 % (ref 14.0–49.7)
MONO#: 0.4 10*3/uL (ref 0.1–0.9)
NEUT#: 2 10*3/uL (ref 1.5–6.5)
NEUT%: 53.1 % (ref 38.4–76.8)
Platelets: 170 10*3/uL (ref 145–400)
WBC: 3.8 10*3/uL — ABNORMAL LOW (ref 3.9–10.3)

## 2012-02-04 MED ORDER — ACETAMINOPHEN 325 MG PO TABS
650.0000 mg | ORAL_TABLET | Freq: Once | ORAL | Status: AC
  Administered 2012-02-04: 650 mg via ORAL

## 2012-02-04 MED ORDER — DIPHENHYDRAMINE HCL 50 MG/ML IJ SOLN: 12.5000 mg | Freq: Once | INTRAMUSCULAR | Status: DC

## 2012-02-04 MED ORDER — TRASTUZUMAB CHEMO INJECTION 440 MG
2.0000 mg/kg | Freq: Once | INTRAVENOUS | Status: AC
  Administered 2012-02-04: 147 mg via INTRAVENOUS
  Filled 2012-02-04: qty 7

## 2012-02-04 MED ORDER — SODIUM CHLORIDE 0.9 % IV SOLN
Freq: Once | INTRAVENOUS | Status: AC
  Administered 2012-02-04: 10:00:00 via INTRAVENOUS

## 2012-02-04 MED ORDER — SODIUM CHLORIDE 0.9 % IJ SOLN
10.0000 mL | INTRAMUSCULAR | Status: DC | PRN
  Administered 2012-02-04: 10 mL
  Filled 2012-02-04: qty 10

## 2012-02-04 MED ORDER — HEPARIN SOD (PORK) LOCK FLUSH 100 UNIT/ML IV SOLN
500.0000 [IU] | Freq: Once | INTRAVENOUS | Status: AC | PRN
  Administered 2012-02-04: 500 [IU]
  Filled 2012-02-04: qty 5

## 2012-02-04 MED ORDER — DIPHENHYDRAMINE HCL 25 MG PO CAPS: 50.0000 mg | ORAL_CAPSULE | Freq: Once | ORAL | Status: DC

## 2012-02-05 ENCOUNTER — Ambulatory Visit
Admission: RE | Admit: 2012-02-05 | Discharge: 2012-02-05 | Disposition: A | Discharge status: Home / Self Care | Payer: Medicaid Other | Source: Ambulatory Visit | Attending: Radiation Oncology | Admitting: Radiation Oncology

## 2012-02-06 ENCOUNTER — Ambulatory Visit
Admission: RE | Admit: 2012-02-06 | Discharge: 2012-02-06 | Disposition: A | Discharge status: Home / Self Care | Payer: Medicaid Other | Source: Ambulatory Visit | Attending: Radiation Oncology | Admitting: Radiation Oncology

## 2012-02-09 ENCOUNTER — Ambulatory Visit
Admission: RE | Admit: 2012-02-09 | Discharge: 2012-02-09 | Disposition: A | Discharge status: Home / Self Care | Payer: Medicaid Other | Source: Ambulatory Visit | Attending: Radiation Oncology | Admitting: Radiation Oncology

## 2012-02-10 ENCOUNTER — Ambulatory Visit
Admission: RE | Admit: 2012-02-10 | Discharge: 2012-02-10 | Disposition: A | Discharge status: Home / Self Care | Payer: Medicaid Other | Source: Ambulatory Visit | Attending: Radiation Oncology | Admitting: Radiation Oncology

## 2012-02-10 ENCOUNTER — Encounter: Payer: Self-pay | Admitting: Radiation Oncology

## 2012-02-10 VITALS — BP 138/83 | HR 88 | Resp 18 | Wt 170.0 lb

## 2012-02-10 DIAGNOSIS — C50419 Malignant neoplasm of upper-outer quadrant of unspecified female breast: Secondary | ICD-10-CM

## 2012-02-10 NOTE — Progress Notes (Signed)
Received patient in the clinic today for PUT with Dr. Michell Heinrich. Patient is alert and oriented to person, place, and time. No distress noted. Steady gait noted. Pleasant affect noted. Patient denies right breast pain but, does reports that under her right breast and axilla are sore. Nipple without discharge and darker in color. Hyperpigmentation of right breast noted without desquamation. Radiation dermatitis on right chest wall noted. Patient reports using biafine and hydrocortisone cream as directed. Patient reports using aquaphor on her right breast at night. Encouraged patient to discuss the aquaphor with Dr. Michell Heinrich. Patient reports that her energy level has remained normal. Reported all findings to Dr. Michell Heinrich.

## 2012-02-10 NOTE — Progress Notes (Signed)
Weekly Management Note Current Dose:  41.4 Gy  Projected Dose: 61 Gy   Narrative:  The patient presents for routine under treatment assessment.  CBCT/MVCT images/Port film x-rays were reviewed.  The chart was checked. Doing well. Some nipple soreness for which she is using aquaphor. Otherwise bifene and hydrocortisone are keeping medial chest and underarm feeling good. Still working.   Physical Findings: Weight: 170 lb (77.111 kg). Dry desquamation in axilla. Dermatitis medially.   Impression:  The patient is tolerating radiation.  Plan:  Continue treatment as planned. Continue radiaplex.

## 2012-02-11 ENCOUNTER — Other Ambulatory Visit (HOSPITAL_BASED_OUTPATIENT_CLINIC_OR_DEPARTMENT_OTHER): Payer: Medicaid Other | Admitting: Lab

## 2012-02-11 ENCOUNTER — Ambulatory Visit (HOSPITAL_BASED_OUTPATIENT_CLINIC_OR_DEPARTMENT_OTHER): Payer: Medicaid Other

## 2012-02-11 ENCOUNTER — Ambulatory Visit
Admission: RE | Admit: 2012-02-11 | Discharge: 2012-02-11 | Disposition: A | Discharge status: Home / Self Care | Payer: Medicaid Other | Source: Ambulatory Visit | Attending: Radiation Oncology | Admitting: Radiation Oncology

## 2012-02-11 VITALS — BP 156/81 | HR 90 | Temp 98.2°F | Resp 18

## 2012-02-11 DIAGNOSIS — C50919 Malignant neoplasm of unspecified site of unspecified female breast: Secondary | ICD-10-CM

## 2012-02-11 DIAGNOSIS — Z5112 Encounter for antineoplastic immunotherapy: Secondary | ICD-10-CM

## 2012-02-11 DIAGNOSIS — C50419 Malignant neoplasm of upper-outer quadrant of unspecified female breast: Secondary | ICD-10-CM

## 2012-02-11 LAB — CBC WITH DIFFERENTIAL/PLATELET
BASO%: 0.5 % (ref 0.0–2.0)
Basophils Absolute: 0 10*3/uL (ref 0.0–0.1)
EOS%: 7.3 % — ABNORMAL HIGH (ref 0.0–7.0)
HCT: 38.9 % (ref 34.8–46.6)
HGB: 13 g/dL (ref 11.6–15.9)
LYMPH%: 28.7 % (ref 14.0–49.7)
MCH: 32.6 pg (ref 25.1–34.0)
MCHC: 33.4 g/dL (ref 31.5–36.0)
MCV: 97.5 fL (ref 79.5–101.0)
MONO%: 12.5 % (ref 0.0–14.0)
NEUT%: 51 % (ref 38.4–76.8)

## 2012-02-11 MED ORDER — SODIUM CHLORIDE 0.9 % IV SOLN
Freq: Once | INTRAVENOUS | Status: AC
  Administered 2012-02-11: 11:00:00 via INTRAVENOUS

## 2012-02-11 MED ORDER — TRASTUZUMAB CHEMO INJECTION 440 MG
2.0000 mg/kg | Freq: Once | INTRAVENOUS | Status: AC
  Administered 2012-02-11: 147 mg via INTRAVENOUS
  Filled 2012-02-11: qty 7

## 2012-02-11 MED ORDER — ACETAMINOPHEN 325 MG PO TABS
650.0000 mg | ORAL_TABLET | Freq: Once | ORAL | Status: AC
  Administered 2012-02-11: 650 mg via ORAL

## 2012-02-12 ENCOUNTER — Ambulatory Visit
Admission: RE | Admit: 2012-02-12 | Discharge: 2012-02-12 | Disposition: A | Discharge status: Home / Self Care | Payer: Medicaid Other | Source: Ambulatory Visit | Attending: Radiation Oncology | Admitting: Radiation Oncology

## 2012-02-13 ENCOUNTER — Ambulatory Visit
Admission: RE | Admit: 2012-02-13 | Discharge: 2012-02-13 | Disposition: A | Discharge status: Home / Self Care | Payer: Medicaid Other | Source: Ambulatory Visit | Attending: Radiation Oncology | Admitting: Radiation Oncology

## 2012-02-16 ENCOUNTER — Ambulatory Visit
Admission: RE | Admit: 2012-02-16 | Discharge: 2012-02-16 | Disposition: A | Discharge status: Home / Self Care | Payer: Medicaid Other | Source: Ambulatory Visit | Attending: Radiation Oncology | Admitting: Radiation Oncology

## 2012-02-17 ENCOUNTER — Ambulatory Visit
Admission: RE | Admit: 2012-02-17 | Discharge: 2012-02-17 | Disposition: A | Discharge status: Home / Self Care | Payer: Medicaid Other | Source: Ambulatory Visit | Attending: Radiation Oncology | Admitting: Radiation Oncology

## 2012-02-17 ENCOUNTER — Encounter: Payer: Self-pay | Admitting: Radiation Oncology

## 2012-02-17 NOTE — Progress Notes (Signed)
Weekly Management Note Current Dose:  51 Gy  Projected Dose: 61 Gy   Narrative:  The patient presents for routine under treatment assessment.  CBCT/MVCT images/Port film x-rays were reviewed.  The chart was checked. Doing well. Pain under breast is better. Using biafene. Working full time.   Physical Findings: Dermatitis over right breast. Inframammary fold has some early moist desquamation.   Impression:  The patient is tolerating radiation.  Plan:  Continue treatment as planned. Add neosporin plus pain to inframmary fold.

## 2012-02-17 NOTE — Progress Notes (Signed)
Pt denies pain, fatigue, loss of appetite, applying Biafine cream to right breast tx area. She states she has tenderness under R breast but has improved over last few days.

## 2012-02-18 ENCOUNTER — Ambulatory Visit
Admission: RE | Admit: 2012-02-18 | Discharge: 2012-02-18 | Disposition: A | Discharge status: Home / Self Care | Payer: Medicaid Other | Source: Ambulatory Visit | Attending: Radiation Oncology | Admitting: Radiation Oncology

## 2012-02-18 ENCOUNTER — Other Ambulatory Visit (HOSPITAL_BASED_OUTPATIENT_CLINIC_OR_DEPARTMENT_OTHER): Payer: Medicaid Other

## 2012-02-18 ENCOUNTER — Ambulatory Visit: Payer: Medicaid Other

## 2012-02-18 ENCOUNTER — Ambulatory Visit (HOSPITAL_BASED_OUTPATIENT_CLINIC_OR_DEPARTMENT_OTHER): Payer: Medicaid Other

## 2012-02-18 VITALS — BP 139/71 | HR 85 | Temp 98.6°F | Resp 18

## 2012-02-18 DIAGNOSIS — C50919 Malignant neoplasm of unspecified site of unspecified female breast: Secondary | ICD-10-CM

## 2012-02-18 DIAGNOSIS — C50419 Malignant neoplasm of upper-outer quadrant of unspecified female breast: Secondary | ICD-10-CM

## 2012-02-18 DIAGNOSIS — Z5112 Encounter for antineoplastic immunotherapy: Secondary | ICD-10-CM

## 2012-02-18 LAB — CBC WITH DIFFERENTIAL/PLATELET
BASO%: 0.3 % (ref 0.0–2.0)
EOS%: 4.9 % (ref 0.0–7.0)
Eosinophils Absolute: 0.2 10*3/uL (ref 0.0–0.5)
LYMPH%: 38 % (ref 14.0–49.7)
MCH: 32.5 pg (ref 25.1–34.0)
MCHC: 33.8 g/dL (ref 31.5–36.0)
MCV: 96.3 fL (ref 79.5–101.0)
MONO%: 8.2 % (ref 0.0–14.0)
Platelets: 158 10*3/uL (ref 145–400)
RBC: 4.03 10*6/uL (ref 3.70–5.45)
nRBC: 0 % (ref 0–0)

## 2012-02-18 MED ORDER — HEPARIN SOD (PORK) LOCK FLUSH 100 UNIT/ML IV SOLN
500.0000 [IU] | Freq: Once | INTRAVENOUS | Status: AC | PRN
  Administered 2012-02-18: 500 [IU]
  Filled 2012-02-18: qty 5

## 2012-02-18 MED ORDER — ACETAMINOPHEN 325 MG PO TABS
650.0000 mg | ORAL_TABLET | Freq: Once | ORAL | Status: AC
  Administered 2012-02-18: 650 mg via ORAL

## 2012-02-18 MED ORDER — TRASTUZUMAB CHEMO INJECTION 440 MG
2.0000 mg/kg | Freq: Once | INTRAVENOUS | Status: AC
  Administered 2012-02-18: 147 mg via INTRAVENOUS
  Filled 2012-02-18: qty 7

## 2012-02-18 MED ORDER — SODIUM CHLORIDE 0.9 % IJ SOLN
10.0000 mL | INTRAMUSCULAR | Status: DC | PRN
  Administered 2012-02-18: 10 mL
  Filled 2012-02-18: qty 10

## 2012-02-18 MED ORDER — SODIUM CHLORIDE 0.9 % IV SOLN
Freq: Once | INTRAVENOUS | Status: AC
  Administered 2012-02-18: 11:00:00 via INTRAVENOUS

## 2012-02-18 NOTE — Patient Instructions (Signed)
[facility] Discharge Instructions for Patients Receiving Chemotherapy  Today you received the following chemotherapy agents Herceptin.  To help prevent nausea and vomiting after your treatment, we encourage you to take your nausea medication.   If you develop nausea and vomiting that is not controlled by your nausea medication, call the clinic. If it is after clinic hours your family physician or the after hours number for the clinic or go to the Emergency Department.   BELOW ARE SYMPTOMS THAT SHOULD BE REPORTED IMMEDIATELY:  *FEVER GREATER THAN 100.5 F  *CHILLS WITH OR WITHOUT FEVER  NAUSEA AND VOMITING THAT IS NOT CONTROLLED WITH YOUR NAUSEA MEDICATION  *UNUSUAL SHORTNESS OF BREATH  *UNUSUAL BRUISING OR BLEEDING  TENDERNESS IN MOUTH AND THROAT WITH OR WITHOUT PRESENCE OF ULCERS  *URINARY PROBLEMS  *BOWEL PROBLEMS  UNUSUAL RASH Items with * indicate a potential emergency and should be followed up as soon as possible.  One of the nurses will contact you 24 hours after your treatment. Please let the nurse know about any problems that you may have experienced. Feel free to call the clinic you have any questions or concerns. The clinic phone number is [phone].   I have been informed and understand all the instructions given to me. I know to contact the clinic, my physician, or go to the Emergency Department if any problems should occur. I do not have any questions at this time, but understand that I may call the clinic during office hours   should I have any questions or need assistance in obtaining follow up care.    __________________________________________  _____________  __________ Signature of Patient or Authorized Representative            Date                   Time    __________________________________________ Nurse's Signature    

## 2012-02-19 ENCOUNTER — Ambulatory Visit: Payer: Medicaid Other

## 2012-02-19 ENCOUNTER — Ambulatory Visit
Admission: RE | Admit: 2012-02-19 | Discharge: 2012-02-19 | Disposition: A | Discharge status: Home / Self Care | Payer: Medicaid Other | Source: Ambulatory Visit | Attending: Radiation Oncology | Admitting: Radiation Oncology

## 2012-02-20 ENCOUNTER — Ambulatory Visit
Admission: RE | Admit: 2012-02-20 | Discharge: 2012-02-20 | Disposition: A | Discharge status: Home / Self Care | Payer: Medicaid Other | Source: Ambulatory Visit | Attending: Radiation Oncology | Admitting: Radiation Oncology

## 2012-02-20 ENCOUNTER — Ambulatory Visit: Payer: Medicaid Other

## 2012-02-23 ENCOUNTER — Ambulatory Visit: Payer: Medicaid Other

## 2012-02-23 ENCOUNTER — Ambulatory Visit
Admission: RE | Admit: 2012-02-23 | Discharge: 2012-02-23 | Disposition: A | Discharge status: Home / Self Care | Payer: Medicaid Other | Source: Ambulatory Visit | Attending: Radiation Oncology | Admitting: Radiation Oncology

## 2012-02-24 ENCOUNTER — Ambulatory Visit: Payer: Medicaid Other

## 2012-02-24 ENCOUNTER — Ambulatory Visit
Admission: RE | Admit: 2012-02-24 | Discharge: 2012-02-24 | Disposition: A | Discharge status: Home / Self Care | Payer: Medicaid Other | Source: Ambulatory Visit | Attending: Radiation Oncology | Admitting: Radiation Oncology

## 2012-02-24 ENCOUNTER — Encounter: Payer: Self-pay | Admitting: Radiation Oncology

## 2012-02-24 VITALS — BP 148/95 | HR 85 | Temp 98.5°F | Resp 20 | Wt 172.7 lb

## 2012-02-24 DIAGNOSIS — C50419 Malignant neoplasm of upper-outer quadrant of unspecified female breast: Secondary | ICD-10-CM

## 2012-02-24 NOTE — Progress Notes (Signed)
  Radiation Oncology         [phone] ________________________________  Name: Rachael Weaver MRN: [id number]  Date: 02/24/2012  DOB: Nov 23, 1950  End of Treatment Note  Diagnosis:   T2N0 triple positive right breast cancer  Indication for treatment:  Curative     Radiation treatment dates:  01/08/2012-02/24/2012  Site/dose:   Right breast /45 Wallace Cullens @ 1.8 Wallace Cullens per fraction x 25 fractions Right cavity / 18 Gray at [employer] per fraction x 8 fractions  Beams/energy:  Opposed Tangents / 6 MV photons En face / 18 MeV electrons  Narrative: The patient tolerated radiation treatment relatively well.   She had the expected skin toxicity.  She was able to work throughout her treatment.   Plan: The patient has completed radiation treatment. The patient will return to radiation oncology clinic for routine followup in one month. I advised them to call or return sooner if they have any questions or concerns related to their recovery or treatment.  ------------------------------------------------  [name], MD

## 2012-02-24 NOTE — Progress Notes (Signed)
Name: Rachael Weaver   MRN: [id number]  Date:  02/10/2012   DOB: [birth year]  Status:outpatient    DIAGNOSIS: Breast cancer.  CONSENT VERIFIED: yes   SET UP: Patient is setup supine   IMMOBILIZATION:  The following immobilization was used:Custom Moldable Pillow, breast board.   NARRATIVE: Rachael Weaver underwent complex simulation and treatment planning for her boost treatment today.  Her tumor volume was outlined on the planning CT scan.    18  MeV electrons will be prescribed to the 93% Isodose line.   A block will be used for beam modification purposes.  A special port plan is requested.

## 2012-02-24 NOTE — Progress Notes (Signed)
Weekly Management Note Current Dose: 61  Gy  Projected Dose:61  Gy   Narrative:  The patient presents for routine under treatment assessment.  CBCT/MVCT images/Port film x-rays were reviewed.  The chart was checked. Doing well. Wondered about reconstruction options.   Physical Findings: Weight: 172 lb 11.2 oz (78.336 kg). Brown breast. Pink in boost area. Inframammary folds and axilla are intact.   Impression:  The patient finishes RT today with expected side effects.   Plan:  F/u in 1 month. Radiaplex x 2 weeks then lotion with vit e. Discussed FYNN. Continue herceptin.

## 2012-02-24 NOTE — Progress Notes (Signed)
Pt denies fatigue, loss of appetite, pain. She is applying Radiaplex to right breast tx area. Pt completed tx today; has FU card.

## 2012-02-25 ENCOUNTER — Ambulatory Visit (HOSPITAL_BASED_OUTPATIENT_CLINIC_OR_DEPARTMENT_OTHER): Payer: Medicaid Other

## 2012-02-25 ENCOUNTER — Telehealth: Payer: Self-pay | Admitting: Oncology

## 2012-02-25 ENCOUNTER — Other Ambulatory Visit (HOSPITAL_BASED_OUTPATIENT_CLINIC_OR_DEPARTMENT_OTHER): Payer: Medicaid Other | Admitting: Lab

## 2012-02-25 ENCOUNTER — Other Ambulatory Visit: Payer: Self-pay | Admitting: Oncology

## 2012-02-25 VITALS — BP 155/94 | HR 84 | Temp 98.2°F | Resp 20

## 2012-02-25 DIAGNOSIS — C50919 Malignant neoplasm of unspecified site of unspecified female breast: Secondary | ICD-10-CM

## 2012-02-25 DIAGNOSIS — Z5112 Encounter for antineoplastic immunotherapy: Secondary | ICD-10-CM

## 2012-02-25 DIAGNOSIS — C50419 Malignant neoplasm of upper-outer quadrant of unspecified female breast: Secondary | ICD-10-CM

## 2012-02-25 LAB — CBC WITH DIFFERENTIAL/PLATELET
BASO%: 0.3 % (ref 0.0–2.0)
LYMPH%: 39.7 % (ref 14.0–49.7)
MCHC: 34 g/dL (ref 31.5–36.0)
MONO#: 0.4 10*3/uL (ref 0.1–0.9)
Platelets: 152 10*3/uL (ref 145–400)
RBC: 4.11 10*6/uL (ref 3.70–5.45)
RDW: 11.6 % (ref 11.2–14.5)
WBC: 3.2 10*3/uL — ABNORMAL LOW (ref 3.9–10.3)
lymph#: 1.3 10*3/uL (ref 0.9–3.3)

## 2012-02-25 MED ORDER — TRASTUZUMAB CHEMO INJECTION 440 MG
6.0000 mg/kg | Freq: Once | INTRAVENOUS | Status: AC
  Administered 2012-02-25: 462 mg via INTRAVENOUS
  Filled 2012-02-25: qty 22

## 2012-02-25 MED ORDER — HEPARIN SOD (PORK) LOCK FLUSH 100 UNIT/ML IV SOLN
500.0000 [IU] | Freq: Once | INTRAVENOUS | Status: AC | PRN
  Administered 2012-02-25: 500 [IU]
  Filled 2012-02-25: qty 5

## 2012-02-25 MED ORDER — SODIUM CHLORIDE 0.9 % IV SOLN
Freq: Once | INTRAVENOUS | Status: AC
  Administered 2012-02-25: 11:00:00 via INTRAVENOUS

## 2012-02-25 MED ORDER — SODIUM CHLORIDE 0.9 % IJ SOLN
10.0000 mL | INTRAMUSCULAR | Status: DC | PRN
  Administered 2012-02-25: 10 mL
  Filled 2012-02-25: qty 10

## 2012-02-25 MED ORDER — ACETAMINOPHEN 325 MG PO TABS
650.0000 mg | ORAL_TABLET | Freq: Once | ORAL | Status: AC
  Administered 2012-02-25: 650 mg via ORAL

## 2012-02-25 NOTE — Telephone Encounter (Signed)
gve the pt her echo appt for 02/26/2012 at [facility]

## 2012-02-25 NOTE — Patient Instructions (Addendum)
[facility] Discharge Instructions for Patients Receiving Chemotherapy  Today you received the following chemotherapy agents herceptin   If you develop nausea and vomiting that is not controlled by your nausea medication, call the clinic. If it is after clinic hours your family physician or the after hours number for the clinic or go to the Emergency Department.   BELOW ARE SYMPTOMS THAT SHOULD BE REPORTED IMMEDIATELY:  *FEVER GREATER THAN 100.5 F  *CHILLS WITH OR WITHOUT FEVER  NAUSEA AND VOMITING THAT IS NOT CONTROLLED WITH YOUR NAUSEA MEDICATION  *UNUSUAL SHORTNESS OF BREATH  *UNUSUAL BRUISING OR BLEEDING  TENDERNESS IN MOUTH AND THROAT WITH OR WITHOUT PRESENCE OF ULCERS  *URINARY PROBLEMS  *BOWEL PROBLEMS  UNUSUAL RASH Items with * indicate a potential emergency and should be followed up as soon as possible.  One of the nurses will contact you 24 hours after your treatment. Please let the nurse know about any problems that you may have experienced. Feel free to call the clinic you have any questions or concerns. The clinic phone number is [phone].   I have been informed and understand all the instructions given to me. I know to contact the clinic, my physician, or go to the Emergency Department if any problems should occur. I do not have any questions at this time, but understand that I may call the clinic during office hours   should I have any questions or need assistance in obtaining follow up care.    __________________________________________  _____________  __________ Signature of Patient or Authorized Representative            Date                   Time    __________________________________________ Nurse's Signature    

## 2012-02-26 ENCOUNTER — Ambulatory Visit (HOSPITAL_COMMUNITY)
Admission: RE | Admit: 2012-02-26 | Discharge: 2012-02-26 | Disposition: A | Discharge status: Home / Self Care | Payer: Medicaid Other | Source: Ambulatory Visit | Attending: Oncology | Admitting: Oncology

## 2012-02-26 DIAGNOSIS — C50919 Malignant neoplasm of unspecified site of unspecified female breast: Secondary | ICD-10-CM | POA: Insufficient documentation

## 2012-02-26 DIAGNOSIS — C50419 Malignant neoplasm of upper-outer quadrant of unspecified female breast: Secondary | ICD-10-CM

## 2012-02-26 DIAGNOSIS — Z09 Encounter for follow-up examination after completed treatment for conditions other than malignant neoplasm: Secondary | ICD-10-CM

## 2012-02-26 DIAGNOSIS — Z87891 Personal history of nicotine dependence: Secondary | ICD-10-CM | POA: Insufficient documentation

## 2012-02-26 NOTE — Progress Notes (Signed)
*  PRELIMINARY RESULTS* Echocardiogram 2D Echocardiogram has been performed.  ANNETA, ROUNDS 02/26/2012, 10:38 AM

## 2012-02-27 ENCOUNTER — Ambulatory Visit (HOSPITAL_BASED_OUTPATIENT_CLINIC_OR_DEPARTMENT_OTHER): Payer: Medicaid Other | Admitting: Oncology

## 2012-02-27 ENCOUNTER — Other Ambulatory Visit (HOSPITAL_BASED_OUTPATIENT_CLINIC_OR_DEPARTMENT_OTHER): Payer: Medicaid Other | Admitting: Lab

## 2012-02-27 ENCOUNTER — Telehealth: Payer: Self-pay | Admitting: *Deleted

## 2012-02-27 VITALS — BP 180/78 | HR 94 | Temp 99.0°F | Resp 20 | Ht 64.0 in | Wt 173.9 lb

## 2012-02-27 DIAGNOSIS — E559 Vitamin D deficiency, unspecified: Secondary | ICD-10-CM

## 2012-02-27 DIAGNOSIS — C50419 Malignant neoplasm of upper-outer quadrant of unspecified female breast: Secondary | ICD-10-CM

## 2012-02-27 DIAGNOSIS — C50919 Malignant neoplasm of unspecified site of unspecified female breast: Secondary | ICD-10-CM

## 2012-02-27 DIAGNOSIS — Z170 Estrogen receptor positive status [ER+]: Secondary | ICD-10-CM

## 2012-02-27 LAB — CBC WITH DIFFERENTIAL/PLATELET
BASO%: 0.4 % (ref 0.0–2.0)
Basophils Absolute: 0 10e3/uL (ref 0.0–0.1)
EOS%: 3.2 % (ref 0.0–7.0)
Eosinophils Absolute: 0.1 10e3/uL (ref 0.0–0.5)
HCT: 38.3 % (ref 34.8–46.6)
HGB: 13.2 g/dL (ref 11.6–15.9)
LYMPH%: 34.8 % (ref 14.0–49.7)
MCH: 33.2 pg (ref 25.1–34.0)
MCHC: 34.5 g/dL (ref 31.5–36.0)
MCV: 96.2 fL (ref 79.5–101.0)
MONO#: 0.5 10e3/uL (ref 0.1–0.9)
MONO%: 11.9 % (ref 0.0–14.0)
NEUT#: 2.1 10e3/uL (ref 1.5–6.5)
NEUT%: 49.7 % (ref 38.4–76.8)
Platelets: 189 10e3/uL (ref 145–400)
RBC: 3.98 10e6/uL (ref 3.70–5.45)
RDW: 12 % (ref 11.2–14.5)
WBC: 4.2 10e3/uL (ref 3.9–10.3)
lymph#: 1.5 10e3/uL (ref 0.9–3.3)

## 2012-02-27 LAB — LACTATE DEHYDROGENASE (CC13): LDH: 245 U/L — ABNORMAL HIGH (ref 125–220)

## 2012-02-27 LAB — COMPREHENSIVE METABOLIC PANEL (CC13)
ALT: 28 U/L (ref 0–55)
AST: 26 U/L (ref 5–34)
Albumin: 4 g/dL (ref 3.5–5.0)
Alkaline Phosphatase: 94 U/L (ref 40–150)
BUN: 16 mg/dL (ref 7.0–26.0)
CO2: 25 meq/L (ref 22–29)
Calcium: 9.4 mg/dL (ref 8.4–10.4)
Chloride: 107 meq/L (ref 98–107)
Creatinine: 0.8 mg/dL (ref 0.6–1.1)
Glucose: 100 mg/dL — ABNORMAL HIGH (ref 70–99)
Potassium: 3.9 meq/L (ref 3.5–5.1)
Sodium: 140 meq/L (ref 136–145)
Total Bilirubin: 0.47 mg/dL (ref 0.20–1.20)
Total Protein: 6.8 g/dL (ref 6.4–8.3)

## 2012-02-27 MED ORDER — ANASTROZOLE 1 MG PO TABS: 1.0000 mg | ORAL_TABLET | Freq: Every day | ORAL | Status: DC

## 2012-02-27 NOTE — Telephone Encounter (Signed)
gve the pt her nov-jan 2014 appt calendar along with the bone density appt at the [facility] in nov.

## 2012-02-27 NOTE — Progress Notes (Signed)
Hematology and Oncology Follow Up Visit  Rachael Weaver [id number] 02/25/1951 61 y.o. 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 Completed 6 cycles of Taxotere/Carbo, Herceptin will continue for one year, started 5/13.  Interim History:  She completed radiation earlier this week, she is fatigued today. Most recent echo was wnl.  Medications:   I have reviewed the patient's current medications.  Allergies: No Known Allergies  Physical Exam: Filed Vitals:   02/27/12 1223  BP: 180/78  Pulse: 94  Temp: 99 F (37.2 C)  Resp: 20    Body mass index is 29.85 kg/(m^2). Weight: 171 lbs. General: Well developed, well nourished, in no acute distress. Alone at today's visit.  EENT: No ocular or oral lesions. No stomatitis.  Respiratory: Lungs are clear to auscultation bilaterally with normal respiratory movement and no accessory muscle use. Cardiac: No murmur, rub or tachycardia. No upper or lower extremity edema.  GI: Abdomen is soft, no palpable hepatosplenomegaly. No fluid wave. No tenderness. Musculoskeletal: No kyphosis, no tenderness over the spine, ribs or hips. Lymph: No cervical, infraclavicular, axillary or inguinal adenopathy. Neuro: No focal neurological deficits. Psych: Alert and oriented X 3, appropriate mood and affect.    Lab Results: Lab Results  Component Value Date   WBC 4.2 02/27/2012   HGB 13.2 02/27/2012   HCT 38.3 02/27/2012   MCV 96.2 02/27/2012   PLT 189 02/27/2012   NEUTROABS 2.1 02/27/2012    Assessment: 61 y/o female with: 1. Diagnosis of right breast cancer, completed chemo 12/18/11, now on Herceptin alone.  2. S/p completion of xrt  10/13.  Plan: Herceptin weekly till end of the month then q 3 weeks.  2d echo at end of the month.   We discussed initiatiion of AI therapy and s/e. She will have a bone density study. I will see her in 6 weeks and f/u herceptin q 3 weeks. Rachael Weaver,PETER11/04/2011

## 2012-02-27 NOTE — Telephone Encounter (Signed)
Per staff message and POF I have scheduled appts.  JMW  

## 2012-03-03 NOTE — Progress Notes (Signed)
Name: Rachael Weaver   MRN: [id number]  Date:  03/03/2012   DOB: [birth year]  Status:outpatient    DIAGNOSIS: Breast cancer.  CONSENT VERIFIED: yes   SET UP: Patient is setup supine   IMMOBILIZATION:  The following immobilization was used:Custom Moldable Pillow, breast board.   NARRATIVE: [name] underwent complex simulation and treatment planning for her boost treatment today.  Her tumor volume was outlined on the planning CT scan. The depth of her cavity was measured   18  MeV electrons will be prescribed to the 93% Isodose line.   A block will be used for beam modification purposes.  A special port plan is requested.

## 2012-03-11 ENCOUNTER — Ambulatory Visit
Admission: RE | Admit: 2012-03-11 | Discharge: 2012-03-11 | Disposition: A | Discharge status: Home / Self Care | Payer: Medicaid Other | Source: Ambulatory Visit | Attending: Oncology | Admitting: Oncology

## 2012-03-11 DIAGNOSIS — E559 Vitamin D deficiency, unspecified: Secondary | ICD-10-CM

## 2012-03-11 DIAGNOSIS — C50419 Malignant neoplasm of upper-outer quadrant of unspecified female breast: Secondary | ICD-10-CM

## 2012-03-16 ENCOUNTER — Other Ambulatory Visit: Payer: Self-pay | Admitting: *Deleted

## 2012-03-17 ENCOUNTER — Other Ambulatory Visit: Payer: Medicaid Other | Admitting: Lab

## 2012-03-17 ENCOUNTER — Ambulatory Visit (HOSPITAL_BASED_OUTPATIENT_CLINIC_OR_DEPARTMENT_OTHER): Payer: Medicaid Other

## 2012-03-17 VITALS — BP 141/81 | HR 90 | Temp 98.3°F | Resp 20

## 2012-03-17 DIAGNOSIS — Z5112 Encounter for antineoplastic immunotherapy: Secondary | ICD-10-CM

## 2012-03-17 DIAGNOSIS — C50419 Malignant neoplasm of upper-outer quadrant of unspecified female breast: Secondary | ICD-10-CM

## 2012-03-17 DIAGNOSIS — C50919 Malignant neoplasm of unspecified site of unspecified female breast: Secondary | ICD-10-CM

## 2012-03-17 MED ORDER — SODIUM CHLORIDE 0.9 % IV SOLN
Freq: Once | INTRAVENOUS | Status: AC
  Administered 2012-03-17: 09:00:00 via INTRAVENOUS

## 2012-03-17 MED ORDER — ACETAMINOPHEN 325 MG PO TABS
650.0000 mg | ORAL_TABLET | Freq: Once | ORAL | Status: AC
  Administered 2012-03-17: 650 mg via ORAL

## 2012-03-17 MED ORDER — TRASTUZUMAB CHEMO INJECTION 440 MG
6.0000 mg/kg | Freq: Once | INTRAVENOUS | Status: AC
  Administered 2012-03-17: 462 mg via INTRAVENOUS
  Filled 2012-03-17: qty 22

## 2012-03-17 MED ORDER — SODIUM CHLORIDE 0.9 % IJ SOLN
10.0000 mL | INTRAMUSCULAR | Status: DC | PRN
  Administered 2012-03-17: 10 mL
  Filled 2012-03-17: qty 10

## 2012-03-17 MED ORDER — HEPARIN SOD (PORK) LOCK FLUSH 100 UNIT/ML IV SOLN
500.0000 [IU] | Freq: Once | INTRAVENOUS | Status: AC | PRN
  Administered 2012-03-17: 500 [IU]
  Filled 2012-03-17: qty 5

## 2012-03-19 ENCOUNTER — Encounter: Payer: Self-pay | Admitting: Radiation Oncology

## 2012-03-19 ENCOUNTER — Ambulatory Visit
Admission: RE | Admit: 2012-03-19 | Discharge: 2012-03-19 | Disposition: A | Discharge status: Home / Self Care | Payer: Medicaid Other | Source: Ambulatory Visit | Attending: Radiation Oncology | Admitting: Radiation Oncology

## 2012-03-19 VITALS — BP 144/70 | HR 97 | Temp 98.4°F | Resp 18 | Wt 174.3 lb

## 2012-03-19 DIAGNOSIS — C50419 Malignant neoplasm of upper-outer quadrant of unspecified female breast: Secondary | ICD-10-CM

## 2012-03-19 NOTE — Progress Notes (Signed)
Patient presents to the clinic today unaccompanied for follow up appointment with Dr. Michell Heinrich. Patient alert and oriented to person, place, and time. No distress noted. Steady gait noted. Pleasant affect noted. Patient denies pain at this time. Patient reports that she received her last dose of herceptin on Wednesday and the next one is scheduled for 12/11. Patient to follow up with Donnie Coffin 12/6. Patient reports that she continues to use radiaplex on the right breast but, plans to transition to otc lotion once tube is complete. Faint hyperpigmentation of right/treated breast noted with desquamation. Patient denies nipple discharge or lumps & bumps in the right breast. Patient reports her energy level is improving. Patient reports lymphedema continues to be only mild. She had two therapy sessions and continues exercises prn. Reported all findings to Dr. Michell Heinrich.

## 2012-03-19 NOTE — Progress Notes (Signed)
   Department of Radiation Oncology  Phone:  [phone] Fax:        [phone]   Name: Rachael Weaver   DOB: [birth year]  MRN: [id number]    Date: 03/19/2012  Follow Up Visit Note  Diagnosis: T2N0 right breast cancer  Interval since last radiation: 1 month  Interval History: Rachael Weaver presents today for routine followup.  she is feeling well and doing well. She continues Herceptin until May. She has an appointment to discuss antiestrogen therapy in the next couple weeks. She is pleased with her cosmetic result and feels her skin is healing well. She has good energy and is excited her hair is growing back.  Allergies: No Known Allergies  Medications:  Current Outpatient Prescriptions  Medication Sig Dispense Refill  . anastrozole (ARIMIDEX) 1 MG tablet Take 1 tablet (1 mg total) by mouth daily.  30 tablet  6   No current facility-administered medications for this encounter.   Facility-Administered Medications Ordered in Other Encounters  Medication Dose Route Frequency Provider Last Rate Last Dose  . sodium chloride 0.9 % injection 10 mL  10 mL Intracatheter PRN Pierce Crane, MD   10 mL at 12/25/11 1430    Physical Exam:   weight is 174 lb 4.8 oz (79.062 kg). Her oral temperature is 98.4 F (36.9 C). Her blood pressure is 144/70 and her pulse is 97. Her respiration is 18.  She has some mild hyperpigmentation over the breast. She has some scar tissue just underneath her lumpectomy incision on the right breast  IMPRESSION: Rachael Weaver is a 61 y.o. female status post breast conservation with resolving acute effects of treatment  PLAN:  She is regular schedule followup with medical oncology. I've encouraged her to use sun protection on her chest and breast when she is going to be out in the sun for long periods of time. I encouraged her to contact me if any questions or concerns.    [name], MD

## 2012-03-31 ENCOUNTER — Ambulatory Visit: Payer: Medicaid Other | Admitting: Oncology

## 2012-04-02 ENCOUNTER — Other Ambulatory Visit: Payer: Medicaid Other

## 2012-04-02 ENCOUNTER — Telehealth: Payer: Self-pay | Admitting: Oncology

## 2012-04-02 ENCOUNTER — Ambulatory Visit (HOSPITAL_BASED_OUTPATIENT_CLINIC_OR_DEPARTMENT_OTHER): Payer: Medicaid Other | Admitting: Oncology

## 2012-04-02 ENCOUNTER — Telehealth: Payer: Self-pay | Admitting: *Deleted

## 2012-04-02 VITALS — BP 152/78 | HR 99 | Temp 98.3°F | Resp 20 | Ht 64.0 in | Wt 170.4 lb

## 2012-04-02 DIAGNOSIS — C50919 Malignant neoplasm of unspecified site of unspecified female breast: Secondary | ICD-10-CM

## 2012-04-02 DIAGNOSIS — C50419 Malignant neoplasm of upper-outer quadrant of unspecified female breast: Secondary | ICD-10-CM

## 2012-04-02 DIAGNOSIS — Z170 Estrogen receptor positive status [ER+]: Secondary | ICD-10-CM

## 2012-04-02 DIAGNOSIS — E559 Vitamin D deficiency, unspecified: Secondary | ICD-10-CM

## 2012-04-02 LAB — CBC WITH DIFFERENTIAL/PLATELET
Basophils Absolute: 0 10*3/uL (ref 0.0–0.1)
Eosinophils Absolute: 0.1 10*3/uL (ref 0.0–0.5)
HCT: 40.2 % (ref 34.8–46.6)
HGB: 13.8 g/dL (ref 11.6–15.9)
LYMPH%: 32.4 % (ref 14.0–49.7)
MONO#: 0.4 10*3/uL (ref 0.1–0.9)
NEUT#: 3.1 10*3/uL (ref 1.5–6.5)
NEUT%: 56.9 % (ref 38.4–76.8)
Platelets: 203 10*3/uL (ref 145–400)
WBC: 5.4 10*3/uL (ref 3.9–10.3)

## 2012-04-02 LAB — COMPREHENSIVE METABOLIC PANEL (CC13)
Albumin: 3.7 g/dL (ref 3.5–5.0)
BUN: 18 mg/dL (ref 7.0–26.0)
Calcium: 9.4 mg/dL (ref 8.4–10.4)
Chloride: 108 mEq/L — ABNORMAL HIGH (ref 98–107)
Glucose: 164 mg/dl — ABNORMAL HIGH (ref 70–99)
Potassium: 3.8 mEq/L (ref 3.5–5.1)
Total Protein: 7.1 g/dL (ref 6.4–8.3)

## 2012-04-02 NOTE — Progress Notes (Signed)
Hematology and Oncology Follow Up Visit  Rachael Weaver [id number] 09-12-1950 61 y.o. 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 Completed 6 cycles of Taxotere/Carbo, Herceptin will continue for one year, started 5/13.  Interim History:  She completed radiation earlier this week, 02/24/12. she is fatigued today. Most recent echo was wnl.she has been on arimidex for 6 weeks and tolerates it well. Her bone density (11/13) showed minimal osteopenia.  Medications:   I have reviewed the patient's current medications.  Allergies: No Known Allergies  Physical Exam: Filed Vitals:   04/02/12 1527  BP: 152/78  Pulse: 99  Temp: 98.3 F (36.8 C)  Resp: 20    Body mass index is 29.25 kg/(m^2). Weight: 171 lbs. General: Well developed, well nourished, in no acute distress. Alone at today's visit.  EENT: No ocular or oral lesions. No stomatitis.  Respiratory: Lungs are clear to auscultation bilaterally with normal respiratory movement and no accessory muscle use. Cardiac: No murmur, rub or tachycardia. No upper or lower extremity edema.  GI: Abdomen is soft, no palpable hepatosplenomegaly. No fluid wave. No tenderness. Musculoskeletal: No kyphosis, no tenderness over the spine, ribs or hips. Lymph: No cervical, infraclavicular, axillary or inguinal adenopathy. Neuro: No focal neurological deficits. Psych: Alert and oriented X 3, appropriate mood and affect.    Lab Results: Lab Results  Component Value Date   WBC 5.4 04/02/2012   HGB 13.8 04/02/2012   HCT 40.2 04/02/2012   MCV 92.0 04/02/2012   PLT 203 04/02/2012   NEUTROABS 3.1 04/02/2012    Assessment: 61 y/o female with: 1.  Diagnosis of right breast cancer, completed chemo 12/18/11, now on Herceptin alone. Due to end 5/14. 2. S/p completion of xrt 10/13.f/u mammogram 3/14.  Plan: F/u 3 weeks. Continue same medication . 20 minutes spent with the pt, most of the time in pt related counselling.    [name],PETER12/09/2011

## 2012-04-02 NOTE — Telephone Encounter (Signed)
Per staff message and POF I have secheduled appts. JMW

## 2012-04-02 NOTE — Telephone Encounter (Signed)
The pt has her jan 2014 appt calendar. She is aware that her chemo appts will be added.sent Rachael Weaver the staff message to add the chemo

## 2012-04-03 LAB — VITAMIN D 25 HYDROXY (VIT D DEFICIENCY, FRACTURES): Vit D, 25-Hydroxy: 22 ng/mL — ABNORMAL LOW (ref 30–89)

## 2012-04-07 ENCOUNTER — Other Ambulatory Visit (HOSPITAL_BASED_OUTPATIENT_CLINIC_OR_DEPARTMENT_OTHER): Payer: Medicaid Other | Admitting: Lab

## 2012-04-07 ENCOUNTER — Ambulatory Visit (HOSPITAL_BASED_OUTPATIENT_CLINIC_OR_DEPARTMENT_OTHER): Payer: Medicaid Other

## 2012-04-07 VITALS — BP 158/89 | HR 75 | Temp 97.7°F

## 2012-04-07 DIAGNOSIS — Z5112 Encounter for antineoplastic immunotherapy: Secondary | ICD-10-CM

## 2012-04-07 DIAGNOSIS — C50919 Malignant neoplasm of unspecified site of unspecified female breast: Secondary | ICD-10-CM

## 2012-04-07 DIAGNOSIS — C50419 Malignant neoplasm of upper-outer quadrant of unspecified female breast: Secondary | ICD-10-CM

## 2012-04-07 LAB — CBC WITH DIFFERENTIAL/PLATELET
Basophils Absolute: 0 10*3/uL (ref 0.0–0.1)
EOS%: 3.7 % (ref 0.0–7.0)
HCT: 41.2 % (ref 34.8–46.6)
HGB: 14.1 g/dL (ref 11.6–15.9)
MCH: 31 pg (ref 25.1–34.0)
MCV: 90.5 fL (ref 79.5–101.0)
NEUT%: 52.2 % (ref 38.4–76.8)
Platelets: 197 10*3/uL (ref 145–400)
lymph#: 1.8 10*3/uL (ref 0.9–3.3)

## 2012-04-07 MED ORDER — ACETAMINOPHEN 325 MG PO TABS
650.0000 mg | ORAL_TABLET | Freq: Once | ORAL | Status: AC
  Administered 2012-04-07: 650 mg via ORAL

## 2012-04-07 MED ORDER — SODIUM CHLORIDE 0.9 % IJ SOLN
10.0000 mL | INTRAMUSCULAR | Status: DC | PRN
  Administered 2012-04-07: 10 mL
  Filled 2012-04-07: qty 10

## 2012-04-07 MED ORDER — HEPARIN SOD (PORK) LOCK FLUSH 100 UNIT/ML IV SOLN
500.0000 [IU] | Freq: Once | INTRAVENOUS | Status: AC | PRN
  Administered 2012-04-07: 500 [IU]
  Filled 2012-04-07: qty 5

## 2012-04-07 MED ORDER — SODIUM CHLORIDE 0.9 % IV SOLN
Freq: Once | INTRAVENOUS | Status: AC
  Administered 2012-04-07: 09:00:00 via INTRAVENOUS

## 2012-04-07 MED ORDER — TRASTUZUMAB CHEMO INJECTION 440 MG
6.0000 mg/kg | Freq: Once | INTRAVENOUS | Status: AC
  Administered 2012-04-07: 462 mg via INTRAVENOUS
  Filled 2012-04-07: qty 22

## 2012-04-07 NOTE — Patient Instructions (Signed)
[facility] Discharge Instructions for Patients Receiving Chemotherapy  Today you received the following - herceptin. .   If you develop nausea and vomiting that is not controlled by your nausea medication, call the clinic. If it is after clinic hours your family physician or the after hours number for the clinic or go to the Emergency Department.   BELOW ARE SYMPTOMS THAT SHOULD BE REPORTED IMMEDIATELY:  *FEVER GREATER THAN 100.5 F  *CHILLS WITH OR WITHOUT FEVER  NAUSEA AND VOMITING THAT IS NOT CONTROLLED WITH YOUR NAUSEA MEDICATION  *UNUSUAL SHORTNESS OF BREATH  *UNUSUAL BRUISING OR BLEEDING  TENDERNESS IN MOUTH AND THROAT WITH OR WITHOUT PRESENCE OF ULCERS  *URINARY PROBLEMS  *BOWEL PROBLEMS  UNUSUAL RASH Items with * indicate a potential emergency and should be followed up as soon as possible.  . Feel free to call the clinic you have any questions or concerns. The clinic phone number is [phone].   I have been informed and understand all the instructions given to me. I know to contact the clinic, my physician, or go to the Emergency Department if any problems should occur. I do not have any questions at this time, but understand that I may call the clinic during office hours   should I have any questions or need assistance in obtaining follow up care.    __________________________________________  _____________  __________ Signature of Patient or Authorized Representative            Date                   Time    __________________________________________ Nurse's Signature

## 2012-04-20 ENCOUNTER — Telehealth: Payer: Self-pay | Admitting: *Deleted

## 2012-04-20 NOTE — Telephone Encounter (Signed)
Patient returned my call and I informed her that Dr. Donnie Coffin was no longer going to be with the practice as of 04/28/12 and answered all questions at this time.  I confirmed 05/03/12 appt w/ pt.

## 2012-04-20 NOTE — Telephone Encounter (Signed)
Left message for pt to return my call about an upcoming appt. 

## 2012-04-20 NOTE — Telephone Encounter (Signed)
Per Misty Stanley from breast center added appt for 1/6 due to MD reassignment.  JMW

## 2012-04-28 ENCOUNTER — Ambulatory Visit: Payer: Medicaid Other

## 2012-04-29 ENCOUNTER — Other Ambulatory Visit: Payer: Medicaid Other | Admitting: Lab

## 2012-04-30 ENCOUNTER — Other Ambulatory Visit: Payer: Self-pay | Admitting: Medical Oncology

## 2012-04-30 ENCOUNTER — Ambulatory Visit: Payer: Medicaid Other

## 2012-04-30 ENCOUNTER — Ambulatory Visit: Payer: Medicaid Other | Admitting: Oncology

## 2012-04-30 ENCOUNTER — Other Ambulatory Visit: Payer: Medicaid Other | Admitting: Lab

## 2012-04-30 DIAGNOSIS — C50919 Malignant neoplasm of unspecified site of unspecified female breast: Secondary | ICD-10-CM

## 2012-04-30 DIAGNOSIS — C50419 Malignant neoplasm of upper-outer quadrant of unspecified female breast: Secondary | ICD-10-CM

## 2012-05-01 ENCOUNTER — Encounter: Payer: Self-pay | Admitting: Oncology

## 2012-05-03 ENCOUNTER — Ambulatory Visit (HOSPITAL_BASED_OUTPATIENT_CLINIC_OR_DEPARTMENT_OTHER): Payer: Medicaid Other

## 2012-05-03 ENCOUNTER — Encounter: Payer: Self-pay | Admitting: Oncology

## 2012-05-03 ENCOUNTER — Telehealth: Payer: Self-pay | Admitting: *Deleted

## 2012-05-03 ENCOUNTER — Telehealth: Payer: Self-pay | Admitting: Oncology

## 2012-05-03 ENCOUNTER — Other Ambulatory Visit (HOSPITAL_BASED_OUTPATIENT_CLINIC_OR_DEPARTMENT_OTHER): Payer: Medicaid Other | Admitting: Lab

## 2012-05-03 ENCOUNTER — Ambulatory Visit (HOSPITAL_BASED_OUTPATIENT_CLINIC_OR_DEPARTMENT_OTHER): Payer: Medicaid Other | Admitting: Oncology

## 2012-05-03 VITALS — BP 153/84 | HR 98 | Temp 98.4°F | Resp 20 | Ht 64.0 in | Wt 174.3 lb

## 2012-05-03 DIAGNOSIS — Z170 Estrogen receptor positive status [ER+]: Secondary | ICD-10-CM

## 2012-05-03 DIAGNOSIS — C50419 Malignant neoplasm of upper-outer quadrant of unspecified female breast: Secondary | ICD-10-CM

## 2012-05-03 DIAGNOSIS — C50919 Malignant neoplasm of unspecified site of unspecified female breast: Secondary | ICD-10-CM

## 2012-05-03 DIAGNOSIS — Z5112 Encounter for antineoplastic immunotherapy: Secondary | ICD-10-CM

## 2012-05-03 LAB — CBC WITH DIFFERENTIAL/PLATELET
Basophils Absolute: 0 10*3/uL (ref 0.0–0.1)
Eosinophils Absolute: 0.2 10*3/uL (ref 0.0–0.5)
LYMPH%: 29.3 % (ref 14.0–49.7)
MCV: 89.9 fL (ref 79.5–101.0)
MONO%: 7.1 % (ref 0.0–14.0)
NEUT#: 2.5 10*3/uL (ref 1.5–6.5)
Platelets: 203 10*3/uL (ref 145–400)
RBC: 4.47 10*6/uL (ref 3.70–5.45)
nRBC: 0 % (ref 0–0)

## 2012-05-03 LAB — COMPREHENSIVE METABOLIC PANEL (CC13)
BUN: 17 mg/dL (ref 7.0–26.0)
CO2: 25 mEq/L (ref 22–29)
Creatinine: 0.9 mg/dL (ref 0.6–1.1)
Glucose: 120 mg/dl — ABNORMAL HIGH (ref 70–99)
Total Bilirubin: 0.32 mg/dL (ref 0.20–1.20)

## 2012-05-03 MED ORDER — HEPARIN SOD (PORK) LOCK FLUSH 100 UNIT/ML IV SOLN
500.0000 [IU] | Freq: Once | INTRAVENOUS | Status: AC | PRN
  Administered 2012-05-03: 500 [IU]
  Filled 2012-05-03: qty 5

## 2012-05-03 MED ORDER — ACETAMINOPHEN 325 MG PO TABS
650.0000 mg | ORAL_TABLET | Freq: Once | ORAL | Status: AC
  Administered 2012-05-03: 650 mg via ORAL

## 2012-05-03 MED ORDER — SODIUM CHLORIDE 0.9 % IJ SOLN
10.0000 mL | INTRAMUSCULAR | Status: DC | PRN
  Administered 2012-05-03: 10 mL
  Filled 2012-05-03: qty 10

## 2012-05-03 MED ORDER — SODIUM CHLORIDE 0.9 % IV SOLN
Freq: Once | INTRAVENOUS | Status: AC
  Administered 2012-05-03: 14:00:00 via INTRAVENOUS

## 2012-05-03 MED ORDER — TRASTUZUMAB CHEMO INJECTION 440 MG
6.0000 mg/kg | Freq: Once | INTRAVENOUS | Status: AC
  Administered 2012-05-03: 462 mg via INTRAVENOUS
  Filled 2012-05-03: qty 22

## 2012-05-03 NOTE — Progress Notes (Signed)
OFFICE PROGRESS NOTE  CC  [name], [name], MD [address] [place] [address] Dr. 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 The tumor was ER positive PR positive HER-2/neu positive. Patient was originally diagnosed in March 2013.  PRIOR THERAPY:  #1 patient originally presented to the multidisciplinary breast clinic on 07/30/2011. She initially presented with increasing tenderness in her bilateral breasts. The pain seemed to be worse on the right which prompted a mammogram. The mammogram showed a 2.9 x 2.8 x 1.5 cm mass. She went on to have a biopsy that showed a invasive ductal carcinoma grade 2 ER positive HER-2/neu positive with a Ki-67 of 66%.She had an MRI of the breasts performed that confirmed a 2.9 x 2.6 x 1.8 cm area she was also noted to have an enlarged right axillary lymph node measuring 1.5 cm. She met with Dr. Pierce Crane as well who did recommend the possibility of doing adjuvant chemotherapy. However patient opted for a lumpectomy up front.  #2 on 08/21/2011 patient had a right breast lumpectomy with sentinel lymph node biopsy. The final pathology revealed a grade 3 invasive ductal carcinoma measuring 3.1 cm with associated high-grade ductal carcinoma in situ all margins were negative. Three sentinel nodes were negative for metastatic disease. The tumor was estrogen receptor +100% per just her receptor-positive 59% HER-2/neu was amplified with a ratio of 2.8 to Ki-67 was 66%. Final Pathologicstaging was T2, N0, M0( stage II).  #3 patient has since gone on to receive adjuvant chemotherapy consisting of Taxotere carboplatinum and Herceptin Given every 3 weeks he she received a total of 6 cycles from 09/04/2011 2 01/07/2012. With this she did receive weekly Herceptin.  #4 once she  completed her chemotherapy patient proceed with radiation therapy to the right breast adjuvantly Beginning 01/08/2012 through 02/24/2012. Overall she tolerated the radiation well. Concurrently with radiation she received Herceptin every 3 weeks.  #5 upon completion of radiation therapy patient was started on adjuvant antiestrogen therapy consisting of Arimidex 1 mg daily in September 2013. She continues Herceptin every 3 weeks she will complete one year of Herceptin in May 2015. The Arimidex will be continued daily for the duration of 5 years.  #6 patient and history is significant for malignancies and she did have genetic counseling The testing was not performed due to insurance  CURRENT THERAPY:Patient is here for her Herceptin and she continues Arimidex 1 mg daily  INTERVAL HISTORY: Rachael Weaver 62 y.o. female returns for Followup visit prior to Herceptin. Overall she is doing well she had previously seen Dr. Caron Presume. Today she is without any complaints she denies any fevers chills night sweats headaches shortness of breath chest pains palpitations no myalgias and arthralgias no peripheral paresthesias she has no hematuria hematochezia melena hemoptysis hematemesis no breast tenderness. Remainder of the 10 point review of systems is negative.  MEDICAL HISTORY: Past Medical History  Diagnosis Date  . Hypertension     borderline- no meds  . Headache     USES OTC MEDS  . GERD (gastroesophageal reflux disease)   . Breast cancer     right breast  . Maintenance chemotherapy following disease   . Allergy     seasonal /rag weed  . Arthritis     hands feet  . Anxiety   . Cataract     no surgery  yet  . Diabetes mellitus     when pregnant only 18 years ago,resolved    ALLERGIES:   has no known allergies.  MEDICATIONS:  Current Outpatient Prescriptions  Medication Sig Dispense Refill  . anastrozole (ARIMIDEX) 1 MG tablet Take 1 tablet (1 mg total) by mouth daily.  30 tablet  6  .  aspirin-acetaminophen-caffeine (EXCEDRIN MIGRAINE) 250-250-65 MG per tablet Take 1 tablet by mouth as needed.      . fexofenadine (ALLEGRA) 30 MG tablet Take 30 mg by mouth as needed.      . Naproxen Sodium (ALEVE PO) Take by mouth.      Marland Kitchen VITAMIN D, CHOLECALCIFEROL, PO Take 4,000 Units by mouth.       No current facility-administered medications for this visit.   Facility-Administered Medications Ordered in Other Visits  Medication Dose Route Frequency Provider Last Rate Last Dose  . sodium chloride 0.9 % injection 10 mL  10 mL Intracatheter PRN Pierce Crane, MD   10 mL at 12/25/11 1430    SURGICAL HISTORY:  Past Surgical History  Procedure Date  . Cesarean section   . Portacath placement 08/21/2011    Procedure: INSERTION PORT-A-CATH;  Surgeon: Currie Paris, MD;  Location:  SURGERY CENTER;  Service: General;  Laterality: Left;  . Breast lumpectomy 08/21/11    right    REVIEW OF SYSTEMS:  Pertinent items are noted in HPI.   HEALTH MAINTENANCE:   PHYSICAL EXAMINATION: Blood pressure 153/84, pulse 98, temperature 98.4 F (36.9 C), resp. rate 20, height 5\' 4"  (1.626 m), weight 174 lb 4.8 oz (79.062 kg). Body mass index is 29.92 kg/(m^2). ECOG PERFORMANCE STATUS: 0 - Asymptomatic   General appearance: alert, cooperative and appears stated age Lymph nodes: Cervical, supraclavicular, and axillary nodes normal. Resp: clear to auscultation bilaterally Back: symmetric, no curvature. ROM normal. No CVA tenderness. Cardio: regular rate and rhythm GI: soft, non-tender; bowel sounds normal; no masses,  no organomegaly Extremities: extremities normal, atraumatic, no cyanosis or edema Neurologic: Grossly normal Breast examination right breast well-healed incisional scar there is no evidence of local recurrence no nipple discharge no masses no skin changes. Left breast no masses or nipple discharge.  LABORATORY DATA: Lab Results  Component Value Date   WBC 4.2 05/03/2012    HGB 13.8 05/03/2012   HCT 40.2 05/03/2012   MCV 89.9 05/03/2012   PLT 203 05/03/2012      Chemistry      Component Value Date/Time   NA 142 04/02/2012 1452   NA 141 12/18/2011 1205   K 3.8 04/02/2012 1452   K 3.6 12/18/2011 1205   CL 108* 04/02/2012 1452   CL 106 12/18/2011 1205   CO2 25 04/02/2012 1452   CO2 27 12/18/2011 1205   BUN 18.0 04/02/2012 1452   BUN 15 12/18/2011 1205   CREATININE 1.0 04/02/2012 1452   CREATININE 0.83 12/18/2011 1205      Component Value Date/Time   CALCIUM 9.4 04/02/2012 1452   CALCIUM 9.2 12/18/2011 1205   ALKPHOS 98 04/02/2012 1452   ALKPHOS 83 12/18/2011 1205   AST 20 04/02/2012 1452   AST 23 12/18/2011 1205   ALT 20 04/02/2012 1452   ALT 31 12/18/2011 1205   BILITOT 0.35 04/02/2012 1452   BILITOT 0.5 12/18/2011 1205       RADIOGRAPHIC STUDIES:  No results found.  ASSESSMENT: 62 year old female with  #1 stage II (T2 N0) invasive ductal carcinoma of the right breast patient is status post lumpectomy  with sentinel lymph node biopsy in May 2013. The final pathology revealed a 3.1 invasive ductal carcinoma with high-grade DCIS tumor was grade 33 sentinel nodes were negative for metastatic disease tumor was ER positive PR positive HER-2/neu positive.  #2 patient went on to receive adjuvant chemotherapy consisting of Taxotere carboplatinum given every 21 days with weekly Herceptin to complete out 6 cycles of chemotherapy. She then went on to receive adjuvant radiation therapy to the right breast with concurrent Herceptin given every 3 weeks. She completed her radiation therapy in September 2013. She then went on to receive antiestrogen therapy with anastrozole 1 mg daily and Herceptin every 3 weeks. She will eventually finish up Herceptin in May 2014. Overall she tolerates her therapy well she is no evidence of recurrent disease.   PLAN:   #1 visual proceed with Herceptin today.  #2 she will return on 05/24/2012 for next cycle of Herceptin.  #3 patient will also  continue the Arimidex on a daily basis overall she is tolerating it well.Her last bone density scan was on 03/11/2012. She did have a low bone mass.And Dr. Donnie Coffin did start her on vitamin D3 4000 units on a daily basis.   All questions were answered. The patient knows to call the clinic with any problems, questions or concerns. We can certainly see the patient much sooner if necessary.  I spent 55 minutes counseling the patient face to face. The total time spent in the appointment was 60 minutes.    Drue Second, MD Medical/Oncology [facility] [phone] (beeper) [phone] (Office)  05/03/2012, 12:41 PM

## 2012-05-03 NOTE — Patient Instructions (Addendum)
Proceed with Herceptin today  Continue arimidex daily  Return in 3 weeks

## 2012-05-03 NOTE — Telephone Encounter (Signed)
appts made and printed for pt,email sent to [facility] to add chemo and pt aware that i will call with echo appt once we get precert        anne

## 2012-05-03 NOTE — Patient Instructions (Signed)
[facility] Discharge Instructions for Patients Receiving Chemotherapy  Today you received the following chemotherapy agents: herceptin  To help prevent nausea and vomiting after your treatment, we encourage you to take your nausea medication.  Take it as often as prescribed.     If you develop nausea and vomiting that is not controlled by your nausea medication, call the clinic. If it is after clinic hours your family physician or the after hours number for the clinic or go to the Emergency Department.   BELOW ARE SYMPTOMS THAT SHOULD BE REPORTED IMMEDIATELY:  *FEVER GREATER THAN 100.5 F  *CHILLS WITH OR WITHOUT FEVER  NAUSEA AND VOMITING THAT IS NOT CONTROLLED WITH YOUR NAUSEA MEDICATION  *UNUSUAL SHORTNESS OF BREATH  *UNUSUAL BRUISING OR BLEEDING  TENDERNESS IN MOUTH AND THROAT WITH OR WITHOUT PRESENCE OF ULCERS  *URINARY PROBLEMS  *BOWEL PROBLEMS  UNUSUAL RASH Items with * indicate a potential emergency and should be followed up as soon as possible.  Feel free to call the clinic you have any questions or concerns. The clinic phone number is [phone].   I have been informed and understand all the instructions given to me. I know to contact the clinic, my physician, or go to the Emergency Department if any problems should occur. I do not have any questions at this time, but understand that I may call the clinic during office hours   should I have any questions or need assistance in obtaining follow up care.    __________________________________________  _____________  __________ Signature of Patient or Authorized Representative            Date                   Time    __________________________________________ Nurse's Signature    

## 2012-05-03 NOTE — Telephone Encounter (Signed)
Per staff message and POF I have scheduled appts.  JMW  

## 2012-05-21 ENCOUNTER — Other Ambulatory Visit: Payer: Self-pay | Admitting: Medical Oncology

## 2012-05-24 ENCOUNTER — Ambulatory Visit (HOSPITAL_BASED_OUTPATIENT_CLINIC_OR_DEPARTMENT_OTHER): Payer: Medicaid Other | Admitting: Adult Health

## 2012-05-24 ENCOUNTER — Telehealth: Payer: Self-pay | Admitting: Oncology

## 2012-05-24 ENCOUNTER — Ambulatory Visit (HOSPITAL_BASED_OUTPATIENT_CLINIC_OR_DEPARTMENT_OTHER): Payer: Medicaid Other

## 2012-05-24 ENCOUNTER — Encounter: Payer: Self-pay | Admitting: Adult Health

## 2012-05-24 ENCOUNTER — Other Ambulatory Visit (HOSPITAL_BASED_OUTPATIENT_CLINIC_OR_DEPARTMENT_OTHER): Payer: Medicaid Other | Admitting: Lab

## 2012-05-24 VITALS — BP 158/77 | HR 88 | Temp 98.5°F | Resp 20 | Ht 64.0 in | Wt 175.2 lb

## 2012-05-24 DIAGNOSIS — C50919 Malignant neoplasm of unspecified site of unspecified female breast: Secondary | ICD-10-CM

## 2012-05-24 DIAGNOSIS — C50419 Malignant neoplasm of upper-outer quadrant of unspecified female breast: Secondary | ICD-10-CM

## 2012-05-24 DIAGNOSIS — Z170 Estrogen receptor positive status [ER+]: Secondary | ICD-10-CM

## 2012-05-24 DIAGNOSIS — Z5112 Encounter for antineoplastic immunotherapy: Secondary | ICD-10-CM

## 2012-05-24 LAB — COMPREHENSIVE METABOLIC PANEL (CC13)
ALT: 18 U/L (ref 0–55)
Albumin: 4 g/dL (ref 3.5–5.0)
BUN: 12.9 mg/dL (ref 7.0–26.0)
CO2: 25 mEq/L (ref 22–29)
Calcium: 9.4 mg/dL (ref 8.4–10.4)
Chloride: 104 mEq/L (ref 98–107)
Creatinine: 0.8 mg/dL (ref 0.6–1.1)
Potassium: 3.7 mEq/L (ref 3.5–5.1)

## 2012-05-24 LAB — CBC WITH DIFFERENTIAL/PLATELET
Eosinophils Absolute: 0.2 10*3/uL (ref 0.0–0.5)
HCT: 39.7 % (ref 34.8–46.6)
LYMPH%: 39.9 % (ref 14.0–49.7)
MONO#: 0.3 10*3/uL (ref 0.1–0.9)
NEUT#: 2.4 10*3/uL (ref 1.5–6.5)
NEUT%: 48.8 % (ref 38.4–76.8)
Platelets: 204 10*3/uL (ref 145–400)
WBC: 4.9 10*3/uL (ref 3.9–10.3)
nRBC: 0 % (ref 0–0)

## 2012-05-24 MED ORDER — SODIUM CHLORIDE 0.9 % IJ SOLN
10.0000 mL | INTRAMUSCULAR | Status: DC | PRN
  Administered 2012-05-24: 10 mL
  Filled 2012-05-24: qty 10

## 2012-05-24 MED ORDER — ACETAMINOPHEN 325 MG PO TABS
650.0000 mg | ORAL_TABLET | Freq: Once | ORAL | Status: AC
  Administered 2012-05-24: 650 mg via ORAL

## 2012-05-24 MED ORDER — HEPARIN SOD (PORK) LOCK FLUSH 100 UNIT/ML IV SOLN
500.0000 [IU] | Freq: Once | INTRAVENOUS | Status: AC | PRN
  Administered 2012-05-24: 500 [IU]
  Filled 2012-05-24: qty 5

## 2012-05-24 MED ORDER — SODIUM CHLORIDE 0.9 % IV SOLN
Freq: Once | INTRAVENOUS | Status: AC
  Administered 2012-05-24: 11:00:00 via INTRAVENOUS

## 2012-05-24 MED ORDER — SODIUM CHLORIDE 0.9 % IV SOLN
6.0000 mg/kg | Freq: Once | INTRAVENOUS | Status: AC
  Administered 2012-05-24: 462 mg via INTRAVENOUS
  Filled 2012-05-24: qty 22

## 2012-05-24 NOTE — Progress Notes (Signed)
OFFICE PROGRESS NOTE  CC  [name], [name], MD [address] [place] [address] Dr. 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 The tumor was ER positive PR positive HER-2/neu positive. Patient was originally diagnosed in March 2013.  PRIOR THERAPY:  #1 patient originally presented to the multidisciplinary breast clinic on 07/30/2011. She initially presented with increasing tenderness in her bilateral breasts. The pain seemed to be worse on the right which prompted a mammogram. The mammogram showed a 2.9 x 2.8 x 1.5 cm mass. She went on to have a biopsy that showed a invasive ductal carcinoma grade 2 ER positive HER-2/neu positive with a Ki-67 of 66%.She had an MRI of the breasts performed that confirmed a 2.9 x 2.6 x 1.8 cm area she was also noted to have an enlarged right axillary lymph node measuring 1.5 cm. She met with Dr. Pierce Crane as well who did recommend the possibility of doing adjuvant chemotherapy. However patient opted for a lumpectomy up front.  #2 on 08/21/2011 patient had a right breast lumpectomy with sentinel lymph node biopsy. The final pathology revealed a grade 3 invasive ductal carcinoma measuring 3.1 cm with associated high-grade ductal carcinoma in situ all margins were negative. Three sentinel nodes were negative for metastatic disease. The tumor was estrogen receptor +100% per just her receptor-positive 59% HER-2/neu was amplified with a ratio of 2.8 to Ki-67 was 66%. Final Pathologicstaging was T2, N0, M0( stage II).  #3 patient has since gone on to receive adjuvant chemotherapy consisting of Taxotere carboplatinum and Herceptin Given every 3 weeks he she received a total of 6 cycles from 09/04/2011 2 01/07/2012. With this she did receive weekly Herceptin.  #4 once she  completed her chemotherapy patient proceed with radiation therapy to the right breast adjuvantly Beginning 01/08/2012 through 02/24/2012. Overall she tolerated the radiation well. Concurrently with radiation she received Herceptin every 3 weeks.  #5 upon completion of radiation therapy patient was started on adjuvant antiestrogen therapy consisting of Arimidex 1 mg daily in September 2013. She continues Herceptin every 3 weeks she will complete one year of Herceptin in May 2015. The Arimidex will be continued daily for the duration of 5 years.  #6 patient and history is significant for malignancies and she did have genetic counseling The testing was not performed due to insurance  CURRENT THERAPY:Patient is here for her Herceptin and she continues Arimidex 1 mg daily  INTERVAL HISTORY: Rachael Weaver 62 y.o. female returns for Followup visit prior to Herceptin.She's doing well today.  She is taking her Arimidex daily and tolerating it well.  She has joint stiffness in the morning that she attributes to her arthritis, denies joint pains, hot flashes, and dryness.  She has her echo scheduled on 05/27/12.  She denies chest pain, shortness of breath, palpitations, or any other concerns.  We updated her health maintenance below.    MEDICAL HISTORY: Past Medical History  Diagnosis Date  . Hypertension     borderline- no meds  . Headache     USES OTC MEDS  . GERD (gastroesophageal reflux disease)   . Breast cancer     right breast  . Maintenance chemotherapy following disease   . Allergy     seasonal /rag weed  . Arthritis     hands feet  . Anxiety   . Cataract  no surgery yet  . Diabetes mellitus     when pregnant only 18 years ago,resolved    ALLERGIES:   has no known allergies.  MEDICATIONS:  Current Outpatient Prescriptions  Medication Sig Dispense Refill  . anastrozole (ARIMIDEX) 1 MG tablet Take 1 tablet (1 mg total) by mouth daily.  30 tablet  6  . aspirin-acetaminophen-caffeine  (EXCEDRIN MIGRAINE) 250-250-65 MG per tablet Take 1 tablet by mouth as needed.      . fexofenadine (ALLEGRA) 30 MG tablet Take 30 mg by mouth as needed.      . Naproxen Sodium (ALEVE PO) Take by mouth.      Marland Kitchen VITAMIN D, CHOLECALCIFEROL, PO Take 4,000 Units by mouth.       No current facility-administered medications for this visit.   Facility-Administered Medications Ordered in Other Visits  Medication Dose Route Frequency Provider Last Rate Last Dose  . sodium chloride 0.9 % injection 10 mL  10 mL Intracatheter PRN Pierce Crane, MD   10 mL at 12/25/11 1430    SURGICAL HISTORY:  Past Surgical History  Procedure Date  . Cesarean section   . Portacath placement 08/21/2011    Procedure: INSERTION PORT-A-CATH;  Surgeon: Currie Paris, MD;  Location: [facility];  Service: General;  Laterality: Left;  . Breast lumpectomy 08/21/11    right    REVIEW OF SYSTEMS:   General: fatigue (-), night sweats (-), fever (-), pain (-) Lymph: palpable nodes (-) HEENT: vision changes (-), mucositis (-), gum bleeding (-), epistaxis (-) Cardiovascular: chest pain (-), palpitations (-) Pulmonary: shortness of breath (-), dyspnea on exertion (-), cough (-), hemoptysis (-) GI:  Early satiety (-), melena (-), dysphagia (-), nausea/vomiting (-), diarrhea (-) GU: dysuria (-), hematuria (-), incontinence (-) Musculoskeletal: joint swelling (-), joint pain (-), back pain (-) Neuro: weakness (-), numbness (-), headache (-), confusion (-) Skin: Rash (-), lesions (-), dryness (-) Psych: depression (-), suicidal/homicidal ideation (-), feeling of hopelessness (-)   Health Maintenance  Mammogram: 07/2011 Colonoscopy:2001, due Bone Density Scan:11/13-osteopenia Pap Smear: 4/13 Eye Exam: years ago Vitamin D Level: 12/13 Lipid Panel: unknown    PHYSICAL EXAMINATION: Blood pressure 158/77, pulse 88, temperature 98.5 F (36.9 C), temperature source Oral, resp. rate 20, height 5\' 4"  (1.626 m),  weight 175 lb 3.2 oz (79.47 kg). Body mass index is 30.07 kg/(m^2). General: Patient is a well appearing female in no acute distress HEENT: PERRLA, sclerae anicteric no conjunctival pallor, MMM Neck: supple, no palpable adenopathy Lungs: clear to auscultation bilaterally, no wheezes, rhonchi, or rales Cardiovascular: regular rate rhythm, S1, S2, no murmurs, rubs or gallops Abdomen: Soft, non-tender, non-distended, normoactive bowel sounds, no HSM Extremities: warm and well perfused, no clubbing, cyanosis, or edema Skin: No rashes or lesions Neuro: Non-focal Breast examination right breast well-healed incisional scar there is no evidence of local recurrence no nipple discharge no masses no skin changes. Left breast no masses or nipple discharge. ECOG PERFORMANCE STATUS: 0 - Asymptomatic   LABORATORY DATA: Lab Results  Component Value Date   WBC 4.9 05/24/2012   HGB 13.5 05/24/2012   HCT 39.7 05/24/2012   MCV 90.0 05/24/2012   PLT 204 05/24/2012      Chemistry      Component Value Date/Time   NA 143 05/03/2012 1213   NA 141 12/18/2011 1205   K 3.8 05/03/2012 1213   K 3.6 12/18/2011 1205   CL 107 05/03/2012 1213   CL 106 12/18/2011 1205   CO2  25 05/03/2012 1213   CO2 27 12/18/2011 1205   BUN 17.0 05/03/2012 1213   BUN 15 12/18/2011 1205   CREATININE 0.9 05/03/2012 1213   CREATININE 0.83 12/18/2011 1205      Component Value Date/Time   CALCIUM 9.2 05/03/2012 1213   CALCIUM 9.2 12/18/2011 1205   ALKPHOS 98 05/03/2012 1213   ALKPHOS 83 12/18/2011 1205   AST 20 05/03/2012 1213   AST 23 12/18/2011 1205   ALT 18 05/03/2012 1213   ALT 31 12/18/2011 1205   BILITOT 0.32 05/03/2012 1213   BILITOT 0.5 12/18/2011 1205       RADIOGRAPHIC STUDIES:  No results found.  ASSESSMENT: 63 year old female with  #1 stage II (T2 N0) invasive ductal carcinoma of the right breast patient is status post lumpectomy with sentinel lymph node biopsy in May 2013. The final pathology revealed a 3.1 invasive ductal carcinoma with  high-grade DCIS tumor was grade 33 sentinel nodes were negative for metastatic disease tumor was ER positive PR positive HER-2/neu positive.  #2 patient went on to receive adjuvant chemotherapy consisting of Taxotere carboplatinum given every 21 days with weekly Herceptin to complete out 6 cycles of chemotherapy. She then went on to receive adjuvant radiation therapy to the right breast with concurrent Herceptin given every 3 weeks. She completed her radiation therapy in September 2013. She then went on to receive antiestrogen therapy with anastrozole 1 mg daily and Herceptin every 3 weeks. She will eventually finish up Herceptin in May 2014. Overall she tolerates her therapy well she is no evidence of recurrent disease.   PLAN:   #1 Mrs. Ramaswamy is doing well today.  She will proceed with Herceptin.  Her echo is scheduled on 05/27/12.  She and I discussed exercising daily, and a diet rich in fruits/vegetables.    #2 she will return on 06/14/2012 for next cycle of Herceptin.  #3 patient will also continue the Arimidex on a daily basis overall she is tolerating it well.Her last bone density scan was on 03/11/2012. She did have a low bone mass.And Dr. Donnie Coffin did start her on vitamin D3 4000 units on a daily basis.   All questions were answered. The patient knows to call the clinic with any problems, questions or concerns. We can certainly see the patient much sooner if necessary.  I spent 25 minutes counseling the patient face to face. The total time spent in the appointment was 30 minutes.  Cherie Ouch Lyn Hollingshead, NP Medical Oncology [facility] Phone: [phone] 05/24/2012, 10:19 AM

## 2012-05-24 NOTE — Telephone Encounter (Signed)
No changes made to schedule at this time. Pt given 1/30 echo appt for 9:15am at [facility]. Pt wants to transfer to [facility]. Per 1/27 pof schedule w/[facility] 2/17 if available. This was forwarded to [facility]. Misty Stanley will work on getting pt in w/[facility] and contact pt. Pt aware.

## 2012-05-24 NOTE — Telephone Encounter (Signed)
Add to previous note. Misty Stanley aware next tx is 2/17.

## 2012-05-24 NOTE — Patient Instructions (Addendum)
Doing well.  Proceed with Herceptin, return in 3 weeks.  Please call us if you have any questions or concerns.

## 2012-05-27 ENCOUNTER — Ambulatory Visit (HOSPITAL_COMMUNITY): Payer: Medicaid Other | Attending: Internal Medicine | Admitting: Radiology

## 2012-05-27 DIAGNOSIS — I428 Other cardiomyopathies: Secondary | ICD-10-CM

## 2012-05-27 DIAGNOSIS — C50919 Malignant neoplasm of unspecified site of unspecified female breast: Secondary | ICD-10-CM | POA: Insufficient documentation

## 2012-05-27 DIAGNOSIS — C50419 Malignant neoplasm of upper-outer quadrant of unspecified female breast: Secondary | ICD-10-CM

## 2012-05-27 DIAGNOSIS — I059 Rheumatic mitral valve disease, unspecified: Secondary | ICD-10-CM | POA: Insufficient documentation

## 2012-05-27 NOTE — Progress Notes (Signed)
Echocardiogram performed.  

## 2012-06-01 ENCOUNTER — Telehealth: Payer: Self-pay | Admitting: Oncology

## 2012-06-01 NOTE — Telephone Encounter (Signed)
Called pt re adjusting her time for 2/17 appts and per pt the last time she was here she requested a transfer from [facility] to [facility] and was waiting on a return call. Pt made aware that this message would be forwarded to Lisa/Tami. Left message for Misty Stanley re above. Pt aware she will be contacted re appt w/[facility]. Pt does not expect to see [facility] 2/17 - pt also has tx.

## 2012-06-11 ENCOUNTER — Telehealth: Payer: Self-pay | Admitting: *Deleted

## 2012-06-11 NOTE — Telephone Encounter (Signed)
Left message for pt to return my call so I can schedule an appt w/ Dr. Magrinat.  

## 2012-06-12 ENCOUNTER — Other Ambulatory Visit: Payer: Self-pay

## 2012-06-14 ENCOUNTER — Ambulatory Visit (HOSPITAL_BASED_OUTPATIENT_CLINIC_OR_DEPARTMENT_OTHER): Payer: Medicaid Other | Admitting: Adult Health

## 2012-06-14 ENCOUNTER — Encounter: Payer: Self-pay | Admitting: Adult Health

## 2012-06-14 ENCOUNTER — Other Ambulatory Visit (HOSPITAL_BASED_OUTPATIENT_CLINIC_OR_DEPARTMENT_OTHER): Payer: Medicaid Other

## 2012-06-14 ENCOUNTER — Ambulatory Visit (HOSPITAL_BASED_OUTPATIENT_CLINIC_OR_DEPARTMENT_OTHER): Payer: Medicaid Other

## 2012-06-14 ENCOUNTER — Telehealth: Payer: Self-pay | Admitting: Oncology

## 2012-06-14 VITALS — BP 160/77 | HR 89 | Temp 98.5°F | Resp 20 | Ht 64.0 in | Wt 178.0 lb

## 2012-06-14 DIAGNOSIS — C50419 Malignant neoplasm of upper-outer quadrant of unspecified female breast: Secondary | ICD-10-CM

## 2012-06-14 DIAGNOSIS — C50919 Malignant neoplasm of unspecified site of unspecified female breast: Secondary | ICD-10-CM

## 2012-06-14 DIAGNOSIS — G4700 Insomnia, unspecified: Secondary | ICD-10-CM

## 2012-06-14 DIAGNOSIS — Z170 Estrogen receptor positive status [ER+]: Secondary | ICD-10-CM

## 2012-06-14 DIAGNOSIS — Z5112 Encounter for antineoplastic immunotherapy: Secondary | ICD-10-CM

## 2012-06-14 LAB — CBC WITH DIFFERENTIAL/PLATELET
BASO%: 0.2 % (ref 0.0–2.0)
EOS%: 3.4 % (ref 0.0–7.0)
HCT: 39.9 % (ref 34.8–46.6)
LYMPH%: 34.4 % (ref 14.0–49.7)
MCH: 30.9 pg (ref 25.1–34.0)
MCHC: 34.3 g/dL (ref 31.5–36.0)
MCV: 89.9 fL (ref 79.5–101.0)
MONO#: 0.4 10*3/uL (ref 0.1–0.9)
MONO%: 8.1 % (ref 0.0–14.0)
NEUT%: 53.9 % (ref 38.4–76.8)
Platelets: 210 10*3/uL (ref 145–400)
RBC: 4.44 10*6/uL (ref 3.70–5.45)
WBC: 4.7 10*3/uL (ref 3.9–10.3)

## 2012-06-14 LAB — COMPREHENSIVE METABOLIC PANEL (CC13)
ALT: 19 U/L (ref 0–55)
AST: 18 U/L (ref 5–34)
CO2: 26 mEq/L (ref 22–29)
Sodium: 141 mEq/L (ref 136–145)
Total Bilirubin: 0.43 mg/dL (ref 0.20–1.20)
Total Protein: 7.3 g/dL (ref 6.4–8.3)

## 2012-06-14 MED ORDER — ACETAMINOPHEN 325 MG PO TABS
650.0000 mg | ORAL_TABLET | Freq: Once | ORAL | Status: AC
  Administered 2012-06-14: 650 mg via ORAL

## 2012-06-14 MED ORDER — SODIUM CHLORIDE 0.9 % IJ SOLN
10.0000 mL | INTRAMUSCULAR | Status: DC | PRN
  Administered 2012-06-14: 10 mL
  Filled 2012-06-14: qty 10

## 2012-06-14 MED ORDER — SODIUM CHLORIDE 0.9 % IV SOLN
Freq: Once | INTRAVENOUS | Status: AC
  Administered 2012-06-14: 11:00:00 via INTRAVENOUS

## 2012-06-14 MED ORDER — TRASTUZUMAB CHEMO INJECTION 440 MG
6.0000 mg/kg | Freq: Once | INTRAVENOUS | Status: AC
  Administered 2012-06-14: 462 mg via INTRAVENOUS
  Filled 2012-06-14: qty 22

## 2012-06-14 MED ORDER — LORAZEPAM 1 MG PO TABS: 1.0000 mg | ORAL_TABLET | Freq: Three times a day (TID) | ORAL | Status: DC

## 2012-06-14 MED ORDER — HEPARIN SOD (PORK) LOCK FLUSH 100 UNIT/ML IV SOLN
500.0000 [IU] | Freq: Once | INTRAVENOUS | Status: AC | PRN
  Administered 2012-06-14: 500 [IU]
  Filled 2012-06-14: qty 5

## 2012-06-14 NOTE — Patient Instructions (Addendum)
[facility] Discharge Instructions for Patients Receiving Chemotherapy  Today you received the following chemotherapy agents herceptin  To help prevent nausea and vomiting after your treatment, we encourage you to take your nausea medication  and take it as often as prescribedIf you develop nausea and vomiting that is not controlled by your nausea medication, call the clinic. If it is after clinic hours your family physician or the after hours number for the clinic or go to the Emergency Department.   BELOW ARE SYMPTOMS THAT SHOULD BE REPORTED IMMEDIATELY:  *FEVER GREATER THAN 100.5 F  *CHILLS WITH OR WITHOUT FEVER  NAUSEA AND VOMITING THAT IS NOT CONTROLLED WITH YOUR NAUSEA MEDICATION  *UNUSUAL SHORTNESS OF BREATH  *UNUSUAL BRUISING OR BLEEDING  TENDERNESS IN MOUTH AND THROAT WITH OR WITHOUT PRESENCE OF ULCERS  *URINARY PROBLEMS  *BOWEL PROBLEMS  UNUSUAL RASH Items with * indicate a potential emergency and should be followed up as soon as possible.  One of the nurses will contact you 24 hours after your treatment. Please let the nurse know about any problems that you may have experienced. Feel free to call the clinic you have any questions or concerns. The clinic phone number is [phone].   I have been informed and understand all the instructions given to me. I know to contact the clinic, my physician, or go to the Emergency Department if any problems should occur. I do not have any questions at this time, but understand that I may call the clinic during office hours   should I have any questions or need assistance in obtaining follow up care.    __________________________________________  _____________  __________ Signature of Patient or Authorized Representative            Date                   Time    __________________________________________ Nurse's Signature    

## 2012-06-14 NOTE — Telephone Encounter (Signed)
Per 2/17 pof cx appts w/[facility]. Pt scheduled to see [facility] 5/12 and inf appts for 4/21 and 5/12 added. lmonvm for pt letting her know schedule is ready. Lm re each appt d/t and mailed schedule. Echo to linda for preauth.

## 2012-06-14 NOTE — Patient Instructions (Addendum)
Doing well.  Proceed with Herceptin.  Please call us if you have any questions or concerns.    

## 2012-06-14 NOTE — Progress Notes (Signed)
OFFICE PROGRESS NOTE  CC  [name], [name], MD [address] [place] [address] Dr. 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 The tumor was ER positive PR positive HER-2/neu positive. Patient was originally diagnosed in March 2013.  PRIOR THERAPY:  #1 patient originally presented to the multidisciplinary breast clinic on 07/30/2011. She initially presented with increasing tenderness in her bilateral breasts. The pain seemed to be worse on the right which prompted a mammogram. The mammogram showed a 2.9 x 2.8 x 1.5 cm mass. She went on to have a biopsy that showed a invasive ductal carcinoma grade 2 ER positive HER-2/neu positive with a Ki-67 of 66%.She had an MRI of the breasts performed that confirmed a 2.9 x 2.6 x 1.8 cm area she was also noted to have an enlarged right axillary lymph node measuring 1.5 cm. She met with Dr. Pierce Crane as well who did recommend the possibility of doing adjuvant chemotherapy. However patient opted for a lumpectomy up front.  #2 on 08/21/2011 patient had a right breast lumpectomy with sentinel lymph node biopsy. The final pathology revealed a grade 3 invasive ductal carcinoma measuring 3.1 cm with associated high-grade ductal carcinoma in situ all margins were negative. Three sentinel nodes were negative for metastatic disease. The tumor was estrogen receptor +100% per just her receptor-positive 59% HER-2/neu was amplified with a ratio of 2.8 to Ki-67 was 66%. Final Pathologicstaging was T2, N0, M0( stage II).  #3 patient has since gone on to receive adjuvant chemotherapy consisting of Taxotere carboplatinum and Herceptin Given every 3 weeks he she received a total of 6 cycles from 09/04/2011 2 01/07/2012. With this she did receive weekly Herceptin.  #4 once she  completed her chemotherapy patient proceed with radiation therapy to the right breast adjuvantly Beginning 01/08/2012 through 02/24/2012. Overall she tolerated the radiation well. Concurrently with radiation she received Herceptin every 3 weeks.  #5 upon completion of radiation therapy patient was started on adjuvant antiestrogen therapy consisting of Arimidex 1 mg daily in September 2013. She continues Herceptin every 3 weeks she will complete one year of Herceptin in May 2015. The Arimidex will be continued daily for the duration of 5 years.  #6 patient and history is significant for malignancies and she did have genetic counseling The testing was not performed due to insurance  CURRENT THERAPY:Patient is here for her Herceptin and she continues Arimidex 1 mg daily  INTERVAL HISTORY: Rachael Weaver 62 y.o. female returns for Followup visit prior to Herceptin.  She continues to do well.  She had an echo on 05/27/12 that showed an EF of 60-65%.  She is taking the Arimidex daily and tolerating it well.  She denies fevers, chills, hot flashes, chest pain, palpitations, shortness of breath, swelling or any other concerns.  She is c/o occasional insomnia, this is relieved by Ativan, one tablet, that she takes on occasion.  Otherwise, a 10 point ROS is neg.   MEDICAL HISTORY: Past Medical History  Diagnosis Date  . Hypertension     borderline- no meds  . Headache     USES OTC MEDS  . GERD (gastroesophageal reflux disease)   . Breast cancer     right breast  . Maintenance chemotherapy following disease   . Allergy     seasonal /rag weed  . Arthritis  hands feet  . Anxiety   . Cataract     no surgery yet  . Diabetes mellitus     when pregnant only 18 years ago,resolved    ALLERGIES:  has No Known Allergies.  MEDICATIONS:  Current Outpatient Prescriptions  Medication Sig Dispense Refill  . anastrozole (ARIMIDEX) 1 MG tablet Take 1 tablet (1 mg total) by mouth daily.  30 tablet  6  .  aspirin 81 MG tablet Take 81 mg by mouth daily.      Marland Kitchen aspirin-acetaminophen-caffeine (EXCEDRIN MIGRAINE) 250-250-65 MG per tablet Take 1 tablet by mouth as needed.      . fexofenadine (ALLEGRA) 30 MG tablet Take 30 mg by mouth as needed.      . fish oil-omega-3 fatty acids 1000 MG capsule Take 2 g by mouth daily.      . Multiple Vitamin (MULTIVITAMIN) tablet Take 1 tablet by mouth daily.      . Naproxen Sodium (ALEVE PO) Take by mouth.      Marland Kitchen VITAMIN D, CHOLECALCIFEROL, PO Take 2,000 Units by mouth.       Marland Kitchen LORazepam (ATIVAN) 1 MG tablet Take 1 tablet (1 mg total) by mouth every 8 (eight) hours.  30 tablet  0   No current facility-administered medications for this visit.   Facility-Administered Medications Ordered in Other Visits  Medication Dose Route Frequency Provider Last Rate Last Dose  . sodium chloride 0.9 % injection 10 mL  10 mL Intracatheter PRN Pierce Crane, MD   10 mL at 12/25/11 1430  . sodium chloride 0.9 % injection 10 mL  10 mL Intracatheter PRN Victorino December, MD   10 mL at 06/14/12 1205    SURGICAL HISTORY:  Past Surgical History  Procedure Laterality Date  . Cesarean section    . Portacath placement  08/21/2011    Procedure: INSERTION PORT-A-CATH;  Surgeon: Currie Paris, MD;  Location: [facility];  Service: General;  Laterality: Left;  . Breast lumpectomy  08/21/11    right    REVIEW OF SYSTEMS:   General: fatigue (-), night sweats (-), fever (-), pain (-) Lymph: palpable nodes (-) HEENT: vision changes (-), mucositis (-), gum bleeding (-), epistaxis (-) Cardiovascular: chest pain (-), palpitations (-) Pulmonary: shortness of breath (-), dyspnea on exertion (-), cough (-), hemoptysis (-) GI:  Early satiety (-), melena (-), dysphagia (-), nausea/vomiting (-), diarrhea (-) GU: dysuria (-), hematuria (-), incontinence (-) Musculoskeletal: joint swelling (-), joint pain (-), back pain (-) Neuro: weakness (-), numbness (-), headache (-), confusion  (-) Skin: Rash (-), lesions (-), dryness (-) Psych: depression (-), suicidal/homicidal ideation (-), feeling of hopelessness (-)   Health Maintenance  Mammogram: 07/2011 Colonoscopy:2001, due Bone Density Scan:11/13-osteopenia Pap Smear: 4/13 Eye Exam: years ago Vitamin D Level: 12/13 Lipid Panel: unknown    PHYSICAL EXAMINATION: Blood pressure 160/77, pulse 89, temperature 98.5 F (36.9 C), temperature source Oral, resp. rate 20, height 5\' 4"  (1.626 m), weight 178 lb (80.74 kg). Body mass index is 30.54 kg/(m^2). General: Patient is a well appearing female in no acute distress HEENT: PERRLA, sclerae anicteric no conjunctival pallor, MMM Neck: supple, no palpable adenopathy Lungs: clear to auscultation bilaterally, no wheezes, rhonchi, or rales Cardiovascular: regular rate rhythm, S1, S2, no murmurs, rubs or gallops Abdomen: Soft, non-tender, non-distended, normoactive bowel sounds, no HSM Extremities: warm and well perfused, no clubbing, cyanosis, or edema Skin: No rashes or lesions Neuro: Non-focal Breast examination right breast well-healed incisional scar there  is no evidence of local recurrence no nipple discharge no masses no skin changes. Left breast no masses or nipple discharge. ECOG PERFORMANCE STATUS: 0 - Asymptomatic   LABORATORY DATA: Lab Results  Component Value Date   WBC 4.7 06/14/2012   HGB 13.7 06/14/2012   HCT 39.9 06/14/2012   MCV 89.9 06/14/2012   PLT 210 06/14/2012      Chemistry      Component Value Date/Time   NA 141 06/14/2012 0934   NA 141 12/18/2011 1205   K 3.8 06/14/2012 0934   K 3.6 12/18/2011 1205   CL 105 06/14/2012 0934   CL 106 12/18/2011 1205   CO2 26 06/14/2012 0934   CO2 27 12/18/2011 1205   BUN 15.9 06/14/2012 0934   BUN 15 12/18/2011 1205   CREATININE 0.8 06/14/2012 0934   CREATININE 0.83 12/18/2011 1205      Component Value Date/Time   CALCIUM 9.5 06/14/2012 0934   CALCIUM 9.2 12/18/2011 1205   ALKPHOS 110 06/14/2012 0934   ALKPHOS 83  12/18/2011 1205   AST 18 06/14/2012 0934   AST 23 12/18/2011 1205   ALT 19 06/14/2012 0934   ALT 31 12/18/2011 1205   BILITOT 0.43 06/14/2012 0934   BILITOT 0.5 12/18/2011 1205       RADIOGRAPHIC STUDIES:  No results found.  ASSESSMENT: 62 year old female with  #1 stage II (T2 N0) invasive ductal carcinoma of the right breast patient is status post lumpectomy with sentinel lymph node biopsy in May 2013. The final pathology revealed a 3.1 invasive ductal carcinoma with high-grade DCIS tumor was grade 33 sentinel nodes were negative for metastatic disease tumor was ER positive PR positive HER-2/neu positive.  #2 patient went on to receive adjuvant chemotherapy consisting of Taxotere carboplatinum given every 21 days with weekly Herceptin to complete out 6 cycles of chemotherapy. She then went on to receive adjuvant radiation therapy to the right breast with concurrent Herceptin given every 3 weeks. She completed her radiation therapy in September 2013. She then went on to receive antiestrogen therapy with anastrozole 1 mg daily and Herceptin every 3 weeks. She will eventually finish up Herceptin in May 2014. Overall she tolerates her therapy well she is no evidence of recurrent disease.  #3 DES daughter  PLAN:   #1 Mrs. Gillott is doing well today.  She will proceed with Herceptin and continue daily Arimidex.  She met with Dr. Darnelle Catalan today to discuss her transfer in care. I ordered her echo for 08/25/12, to the attn of Dr. Gala Romney.  She will return in May 2014 for an appt with Dr. Darnelle Catalan.   #2 I refilled her Ativan for sleep x 1.  I encouraged her to get a PCP for her non-oncologic needs.    #3 Her last bone density scan was on 03/11/2012. She did have a low bone mass.And Dr. Donnie Coffin did start her on vitamin D3 4000 units on a daily basis.   All questions were answered. The patient knows to call the clinic with any problems, questions or concerns. We can certainly see the patient much sooner  if necessary.  I spent 25 minutes counseling the patient face to face. The total time spent in the appointment was 30 minutes.  Cherie Ouch Lyn Hollingshead, NP Medical Oncology [facility] Phone: [phone] 06/14/2012, 1:33 PM

## 2012-06-17 ENCOUNTER — Telehealth: Payer: Self-pay | Admitting: *Deleted

## 2012-06-17 NOTE — Telephone Encounter (Signed)
Left message for pt to return my call so I can schedule an appt w/ Dr. Magrinat.  

## 2012-06-18 ENCOUNTER — Telehealth: Payer: Self-pay | Admitting: Oncology

## 2012-06-18 NOTE — Telephone Encounter (Signed)
lmonvm for pt re echo/Bensimhom 4/28 (code#0001). Pt to get schedule w/this appt when she comes in 3/10. Pt has other appts.

## 2012-07-05 ENCOUNTER — Other Ambulatory Visit (HOSPITAL_BASED_OUTPATIENT_CLINIC_OR_DEPARTMENT_OTHER): Payer: Medicaid Other

## 2012-07-05 ENCOUNTER — Ambulatory Visit (HOSPITAL_BASED_OUTPATIENT_CLINIC_OR_DEPARTMENT_OTHER): Payer: Medicaid Other

## 2012-07-05 ENCOUNTER — Ambulatory Visit: Payer: Medicaid Other | Admitting: Adult Health

## 2012-07-05 VITALS — BP 152/85 | HR 95 | Temp 98.6°F | Resp 16

## 2012-07-05 DIAGNOSIS — Z5112 Encounter for antineoplastic immunotherapy: Secondary | ICD-10-CM

## 2012-07-05 LAB — COMPREHENSIVE METABOLIC PANEL (CC13)
Albumin: 3.6 g/dL (ref 3.5–5.0)
Alkaline Phosphatase: 107 U/L (ref 40–150)
BUN: 19.1 mg/dL (ref 7.0–26.0)
Glucose: 150 mg/dl — ABNORMAL HIGH (ref 70–99)
Potassium: 3.6 mEq/L (ref 3.5–5.1)

## 2012-07-05 LAB — CBC WITH DIFFERENTIAL/PLATELET
Basophils Absolute: 0 10*3/uL (ref 0.0–0.1)
EOS%: 4.4 % (ref 0.0–7.0)
HCT: 40.7 % (ref 34.8–46.6)
HGB: 13.9 g/dL (ref 11.6–15.9)
MCH: 30.8 pg (ref 25.1–34.0)
MCV: 90.2 fL (ref 79.5–101.0)
MONO%: 6.1 % (ref 0.0–14.0)
NEUT%: 56.5 % (ref 38.4–76.8)
Platelets: 218 10*3/uL (ref 145–400)
lymph#: 1.7 10*3/uL (ref 0.9–3.3)

## 2012-07-05 MED ORDER — ACETAMINOPHEN 325 MG PO TABS
650.0000 mg | ORAL_TABLET | Freq: Once | ORAL | Status: AC
  Administered 2012-07-05: 650 mg via ORAL

## 2012-07-05 MED ORDER — HEPARIN SOD (PORK) LOCK FLUSH 100 UNIT/ML IV SOLN
500.0000 [IU] | Freq: Once | INTRAVENOUS | Status: AC | PRN
  Administered 2012-07-05: 500 [IU]
  Filled 2012-07-05: qty 5

## 2012-07-05 MED ORDER — SODIUM CHLORIDE 0.9 % IJ SOLN
10.0000 mL | INTRAMUSCULAR | Status: DC | PRN
  Administered 2012-07-05: 10 mL
  Filled 2012-07-05: qty 10

## 2012-07-05 MED ORDER — TRASTUZUMAB CHEMO INJECTION 440 MG
6.0000 mg/kg | Freq: Once | INTRAVENOUS | Status: AC
  Administered 2012-07-05: 462 mg via INTRAVENOUS
  Filled 2012-07-05: qty 22

## 2012-07-05 MED ORDER — SODIUM CHLORIDE 0.9 % IV SOLN
Freq: Once | INTRAVENOUS | Status: AC
  Administered 2012-07-05: 12:00:00 via INTRAVENOUS

## 2012-07-05 NOTE — Patient Instructions (Addendum)
[facility] Discharge Instructions for Patients Receiving Chemotherapy  Today you received the following chemotherapy agents herceptin   If you develop nausea and vomiting that is not controlled by your nausea medication, call the clinic. If it is after clinic hours your family physician or the after hours number for the clinic or go to the Emergency Department.   BELOW ARE SYMPTOMS THAT SHOULD BE REPORTED IMMEDIATELY:  *FEVER GREATER THAN 100.5 F  *CHILLS WITH OR WITHOUT FEVER  NAUSEA AND VOMITING THAT IS NOT CONTROLLED WITH YOUR NAUSEA MEDICATION  *UNUSUAL SHORTNESS OF BREATH  *UNUSUAL BRUISING OR BLEEDING  TENDERNESS IN MOUTH AND THROAT WITH OR WITHOUT PRESENCE OF ULCERS  *URINARY PROBLEMS  *BOWEL PROBLEMS  UNUSUAL RASH Items with * indicate a potential emergency and should be followed up as soon as possible.   Feel free to call the clinic you have any questions or concerns. The clinic phone number is [phone].   I have been informed and understand all the instructions given to me. I know to contact the clinic, my physician, or go to the Emergency Department if any problems should occur. I do not have any questions at this time, but understand that I may call the clinic during office hours   should I have any questions or need assistance in obtaining follow up care.    __________________________________________  _____________  __________ Signature of Patient or Authorized Representative            Date                   Time    __________________________________________ Nurse's Signature    

## 2012-07-26 ENCOUNTER — Ambulatory Visit: Payer: Medicaid Other | Admitting: Adult Health

## 2012-07-26 ENCOUNTER — Ambulatory Visit (HOSPITAL_BASED_OUTPATIENT_CLINIC_OR_DEPARTMENT_OTHER): Payer: Medicaid Other

## 2012-07-26 ENCOUNTER — Other Ambulatory Visit (HOSPITAL_BASED_OUTPATIENT_CLINIC_OR_DEPARTMENT_OTHER): Payer: Medicaid Other | Admitting: Lab

## 2012-07-26 VITALS — BP 152/81 | HR 94 | Temp 98.0°F | Resp 17

## 2012-07-26 DIAGNOSIS — Z5112 Encounter for antineoplastic immunotherapy: Secondary | ICD-10-CM

## 2012-07-26 DIAGNOSIS — C50419 Malignant neoplasm of upper-outer quadrant of unspecified female breast: Secondary | ICD-10-CM

## 2012-07-26 DIAGNOSIS — C50919 Malignant neoplasm of unspecified site of unspecified female breast: Secondary | ICD-10-CM

## 2012-07-26 LAB — COMPREHENSIVE METABOLIC PANEL (CC13)
ALT: 19 U/L (ref 0–55)
BUN: 14.9 mg/dL (ref 7.0–26.0)
CO2: 26 mEq/L (ref 22–29)
Calcium: 9.1 mg/dL (ref 8.4–10.4)
Chloride: 105 mEq/L (ref 98–107)
Creatinine: 1 mg/dL (ref 0.6–1.1)
Glucose: 125 mg/dl — ABNORMAL HIGH (ref 70–99)

## 2012-07-26 LAB — CBC WITH DIFFERENTIAL/PLATELET
Basophils Absolute: 0 10*3/uL (ref 0.0–0.1)
Eosinophils Absolute: 0.1 10*3/uL (ref 0.0–0.5)
HCT: 41.1 % (ref 34.8–46.6)
LYMPH%: 33.3 % (ref 14.0–49.7)
MONO#: 0.3 10*3/uL (ref 0.1–0.9)
NEUT#: 3 10*3/uL (ref 1.5–6.5)
NEUT%: 57.6 % (ref 38.4–76.8)
Platelets: 212 10*3/uL (ref 145–400)
WBC: 5.2 10*3/uL (ref 3.9–10.3)

## 2012-07-26 MED ORDER — HEPARIN SOD (PORK) LOCK FLUSH 100 UNIT/ML IV SOLN
500.0000 [IU] | Freq: Once | INTRAVENOUS | Status: AC | PRN
  Administered 2012-07-26: 500 [IU]
  Filled 2012-07-26: qty 5

## 2012-07-26 MED ORDER — SODIUM CHLORIDE 0.9 % IV SOLN
Freq: Once | INTRAVENOUS | Status: AC
  Administered 2012-07-26: 11:00:00 via INTRAVENOUS

## 2012-07-26 MED ORDER — TRASTUZUMAB CHEMO INJECTION 440 MG
6.0000 mg/kg | Freq: Once | INTRAVENOUS | Status: AC
  Administered 2012-07-26: 462 mg via INTRAVENOUS
  Filled 2012-07-26: qty 22

## 2012-07-26 MED ORDER — ACETAMINOPHEN 325 MG PO TABS
650.0000 mg | ORAL_TABLET | Freq: Once | ORAL | Status: AC
  Administered 2012-07-26: 650 mg via ORAL

## 2012-07-26 MED ORDER — SODIUM CHLORIDE 0.9 % IJ SOLN
10.0000 mL | INTRAMUSCULAR | Status: DC | PRN
  Administered 2012-07-26: 10 mL
  Filled 2012-07-26: qty 10

## 2012-07-26 NOTE — Patient Instructions (Signed)
[facility] Discharge Instructions for Patients Receiving Chemotherapy  Today you received the following chemotherapy agents :  Herceptin.  To help prevent nausea and vomiting after your treatment, we encourage you to take your nausea medication as instructed by your physician.    If you develop nausea and vomiting that is not controlled by your nausea medication, call the clinic. If it is after clinic hours your family physician or the after hours number for the clinic or go to the Emergency Department.   BELOW ARE SYMPTOMS THAT SHOULD BE REPORTED IMMEDIATELY:  *FEVER GREATER THAN 100.5 F  *CHILLS WITH OR WITHOUT FEVER  NAUSEA AND VOMITING THAT IS NOT CONTROLLED WITH YOUR NAUSEA MEDICATION  *UNUSUAL SHORTNESS OF BREATH  *UNUSUAL BRUISING OR BLEEDING  TENDERNESS IN MOUTH AND THROAT WITH OR WITHOUT PRESENCE OF ULCERS  *URINARY PROBLEMS  *BOWEL PROBLEMS  UNUSUAL RASH Items with * indicate a potential emergency and should be followed up as soon as possible.  One of the nurses will contact you 24 hours after your treatment. Please let the nurse know about any problems that you may have experienced. Feel free to call the clinic you have any questions or concerns. The clinic phone number is [phone].   I have been informed and understand all the instructions given to me. I know to contact the clinic, my physician, or go to the Emergency Department if any problems should occur. I do not have any questions at this time, but understand that I may call the clinic during office hours   should I have any questions or need assistance in obtaining follow up care.    __________________________________________  _____________  __________ Signature of Patient or Authorized Representative            Date                   Time    __________________________________________ Nurse's Signature    

## 2012-08-16 ENCOUNTER — Other Ambulatory Visit (HOSPITAL_BASED_OUTPATIENT_CLINIC_OR_DEPARTMENT_OTHER): Payer: Medicaid Other | Admitting: Lab

## 2012-08-16 ENCOUNTER — Ambulatory Visit (HOSPITAL_BASED_OUTPATIENT_CLINIC_OR_DEPARTMENT_OTHER): Payer: Medicaid Other

## 2012-08-16 VITALS — BP 145/82 | HR 93 | Temp 98.5°F | Resp 18

## 2012-08-16 DIAGNOSIS — C50919 Malignant neoplasm of unspecified site of unspecified female breast: Secondary | ICD-10-CM

## 2012-08-16 DIAGNOSIS — Z5112 Encounter for antineoplastic immunotherapy: Secondary | ICD-10-CM

## 2012-08-16 DIAGNOSIS — C50419 Malignant neoplasm of upper-outer quadrant of unspecified female breast: Secondary | ICD-10-CM

## 2012-08-16 LAB — COMPREHENSIVE METABOLIC PANEL (CC13)
AST: 20 U/L (ref 5–34)
Alkaline Phosphatase: 106 U/L (ref 40–150)
BUN: 17 mg/dL (ref 7.0–26.0)
Glucose: 135 mg/dl — ABNORMAL HIGH (ref 70–99)
Potassium: 3.6 mEq/L (ref 3.5–5.1)
Sodium: 142 mEq/L (ref 136–145)
Total Bilirubin: 0.38 mg/dL (ref 0.20–1.20)
Total Protein: 6.8 g/dL (ref 6.4–8.3)

## 2012-08-16 LAB — CBC WITH DIFFERENTIAL/PLATELET
EOS%: 2.4 % (ref 0.0–7.0)
Eosinophils Absolute: 0.1 10*3/uL (ref 0.0–0.5)
LYMPH%: 29.5 % (ref 14.0–49.7)
MCH: 32.2 pg (ref 25.1–34.0)
MCV: 90.5 fL (ref 79.5–101.0)
MONO%: 6.7 % (ref 0.0–14.0)
NEUT#: 3.7 10*3/uL (ref 1.5–6.5)
Platelets: 230 10*3/uL (ref 145–400)
RBC: 4.46 10*6/uL (ref 3.70–5.45)
RDW: 12.7 % (ref 11.2–14.5)

## 2012-08-16 MED ORDER — SODIUM CHLORIDE 0.9 % IV SOLN
Freq: Once | INTRAVENOUS | Status: AC
  Administered 2012-08-16: 10:00:00 via INTRAVENOUS

## 2012-08-16 MED ORDER — ACETAMINOPHEN 325 MG PO TABS
650.0000 mg | ORAL_TABLET | Freq: Once | ORAL | Status: AC
  Administered 2012-08-16: 650 mg via ORAL

## 2012-08-16 MED ORDER — SODIUM CHLORIDE 0.9 % IJ SOLN
10.0000 mL | INTRAMUSCULAR | Status: DC | PRN
  Administered 2012-08-16: 10 mL
  Filled 2012-08-16: qty 10

## 2012-08-16 MED ORDER — HEPARIN SOD (PORK) LOCK FLUSH 100 UNIT/ML IV SOLN
500.0000 [IU] | Freq: Once | INTRAVENOUS | Status: AC | PRN
  Administered 2012-08-16: 500 [IU]
  Filled 2012-08-16: qty 5

## 2012-08-16 MED ORDER — TRASTUZUMAB CHEMO INJECTION 440 MG
6.0000 mg/kg | Freq: Once | INTRAVENOUS | Status: AC
  Administered 2012-08-16: 462 mg via INTRAVENOUS
  Filled 2012-08-16: qty 22

## 2012-08-16 NOTE — Patient Instructions (Addendum)

## 2012-08-23 ENCOUNTER — Ambulatory Visit (HOSPITAL_COMMUNITY)
Admission: RE | Admit: 2012-08-23 | Discharge: 2012-08-23 | Disposition: A | Discharge status: Home / Self Care | Payer: Medicaid Other | Source: Ambulatory Visit | Attending: Oncology | Admitting: Oncology

## 2012-08-23 ENCOUNTER — Ambulatory Visit (HOSPITAL_BASED_OUTPATIENT_CLINIC_OR_DEPARTMENT_OTHER)
Admission: RE | Admit: 2012-08-23 | Discharge: 2012-08-23 | Disposition: A | Discharge status: Home / Self Care | Payer: Medicaid Other | Source: Ambulatory Visit | Attending: Internal Medicine | Admitting: Internal Medicine

## 2012-08-23 VITALS — BP 180/100 | HR 85 | Wt 176.1 lb

## 2012-08-23 DIAGNOSIS — F411 Generalized anxiety disorder: Secondary | ICD-10-CM | POA: Insufficient documentation

## 2012-08-23 DIAGNOSIS — Z09 Encounter for follow-up examination after completed treatment for conditions other than malignant neoplasm: Secondary | ICD-10-CM

## 2012-08-23 DIAGNOSIS — Z7982 Long term (current) use of aspirin: Secondary | ICD-10-CM | POA: Insufficient documentation

## 2012-08-23 DIAGNOSIS — K219 Gastro-esophageal reflux disease without esophagitis: Secondary | ICD-10-CM | POA: Insufficient documentation

## 2012-08-23 DIAGNOSIS — C50411 Malignant neoplasm of upper-outer quadrant of right female breast: Secondary | ICD-10-CM

## 2012-08-23 DIAGNOSIS — J309 Allergic rhinitis, unspecified: Secondary | ICD-10-CM | POA: Insufficient documentation

## 2012-08-23 DIAGNOSIS — I10 Essential (primary) hypertension: Secondary | ICD-10-CM | POA: Insufficient documentation

## 2012-08-23 DIAGNOSIS — C50419 Malignant neoplasm of upper-outer quadrant of unspecified female breast: Secondary | ICD-10-CM

## 2012-08-23 DIAGNOSIS — C50919 Malignant neoplasm of unspecified site of unspecified female breast: Secondary | ICD-10-CM | POA: Insufficient documentation

## 2012-08-23 DIAGNOSIS — M129 Arthropathy, unspecified: Secondary | ICD-10-CM | POA: Insufficient documentation

## 2012-08-23 DIAGNOSIS — R0602 Shortness of breath: Secondary | ICD-10-CM | POA: Insufficient documentation

## 2012-08-23 DIAGNOSIS — E119 Type 2 diabetes mellitus without complications: Secondary | ICD-10-CM | POA: Insufficient documentation

## 2012-08-23 DIAGNOSIS — H269 Unspecified cataract: Secondary | ICD-10-CM | POA: Insufficient documentation

## 2012-08-23 DIAGNOSIS — Z79899 Other long term (current) drug therapy: Secondary | ICD-10-CM | POA: Insufficient documentation

## 2012-08-23 MED ORDER — LOSARTAN POTASSIUM 50 MG PO TABS: 50.0000 mg | ORAL_TABLET | Freq: Every day | ORAL | Status: DC

## 2012-08-23 NOTE — Progress Notes (Signed)
  Echocardiogram 2D Echocardiogram has been performed.  Ellender Hose A 08/23/2012, 9:55 AM

## 2012-08-23 NOTE — Patient Instructions (Addendum)
Take Losartan 50 mg daily  Please establish with PCP  Follow up as needed

## 2012-08-23 NOTE — Assessment & Plan Note (Addendum)
Explained the purpose of the Cardio-Onc clinic as it relates to beast cancer. Discussed that there is ~10% chance of developing cadiotoxicity due to Heceptin. Dr Gala Romney reviewed and discussed  ECHOs. EF and lateral S' stable. She will complete Herceptin next month. Follow up as needed. '  Patient seen and examined with Tonye Becket, NP. We discussed all aspects of the encounter. I agree with the assessment and plan as stated above.  Explained incidence of Herceptin cardiotoxicity and role of Cardio-oncology clinic at length. Echo images reviewed personally. All parameters stable. Reviewed signs and symptoms of HF to look for. Continue Herceptin. Follow-up with echo in 3 months.

## 2012-08-23 NOTE — Assessment & Plan Note (Addendum)
SBP 140. Add losartan 50 mg daily. Encouraged to establish with PCP for ongoing BP management.   Attending: Agree.

## 2012-08-23 NOTE — Progress Notes (Signed)
Patient ID: Rachael Weaver, female   DOB: 10/16/1950, 62 y.o.   MRN: [id number] Oncologist: Dr Darnelle Catalan General Surgeon: Dr Jamey Ripa   HPI: Rachael Weaver is a 62 year old patient with R breast cancer (diagnosed 2013) -ER positive PR positive HER-2/neu positive. S/P Lumpectomy 08/21/11, HTN, and no known coronary disease. She is referred to the Cardio-Onc clinic by Dr Welton Flakes due to ongoing Heceptin treatment.    She received adjuvant chemotherapy consisting of Taxotere carboplatinum and Herceptin every 3 weeks. She received a total of 6 cycles from 09/04/2011 - 01/07/2012. This was followed by radiation 01/08/12 -02/24/2012. She will complete Herceptin May 2014. She will continue on Arimidex daily for 5 years.   Denies SOB/PND/Orthopnea. Works full time as a Teaching laboratory technician.   ECHO 02/26/12 EF 50-55% ECHO 05/27/2012 EF 60-65% lateral S 8.8 ECHO 08/23/12 EF 60-65% Lateral S' 8.6  Review of Systems:     Cardiac Review of Systems: {Y] = yes [ ]  = no  Chest Pain [    ]  Resting SOB [   ] Exertional SOB  [  ]  Orthopnea [  ]   Pedal Edema [   ]    Palpitations [  ] Syncope  [  ]   Presyncope [   ]  General Review of Systems: [Y] = yes [  ]=no Constitional: recent weight change [  ]; anorexia [  ]; fatigue [ Y ]; nausea [  ]; night sweats [  ]; fever [  ]; or chills [  ];                                                                             Dental: poor dentition[  ];  Eye : blurred vision [  ]; diplopia [   ]; vision changes [  ];  Amaurosis fugax[  ]; Resp: cough [  ];  wheezing[  ];  hemoptysis[  ]; shortness of breath[  ]; paroxysmal nocturnal dyspnea[  ]; dyspnea on exertion[  ]; or orthopnea[  ];  GI:  gallstones[  ], vomiting[  ];  dysphagia[  ]; melena[  ];  hematochezia [  ]; heartburn[  ];   GU: kidney stones [  ]; hematuria[  ];   dysuria [  ];  nocturia[  ];  history of     obstruction [  ];                 Skin: rash, swelling[  ];, hair loss[  ];  peripheral edema[  ];  or  itching[  ]; Musculosketetal: myalgias[  ];  joint swelling[  ];  joint erythema[  ];  joint pain[  ];  back pain[  ];  Heme/Lymph: bruising[  ];  bleeding[  ];  anemia[  ];  Neuro: TIA[  ];  headaches[Y  ];  stroke[  ];  vertigo[  ];  seizures[  ];   paresthesias[  ];  difficulty walking[  ];  Psych:depression[  ]; anxiety[ y ];  Endocrine: diabetes[  ];  thyroid dysfunction[  ];  Immunizations: Flu [  ]; Pneumococcal[  ];  Other:    Past Medical History  Diagnosis Date  .  Hypertension     borderline- no meds  . Headache     USES OTC MEDS  . GERD (gastroesophageal reflux disease)   . Breast cancer     right breast  . Maintenance chemotherapy following disease   . Allergy     seasonal /rag weed  . Arthritis     hands feet  . Anxiety   . Cataract     no surgery yet  . Diabetes mellitus     when pregnant only 18 years ago,resolved    Current Outpatient Prescriptions  Medication Sig Dispense Refill  . anastrozole (ARIMIDEX) 1 MG tablet Take 1 tablet (1 mg total) by mouth daily.  30 tablet  6  . aspirin 81 MG tablet Take 81 mg by mouth daily.      Marland Kitchen aspirin-acetaminophen-caffeine (EXCEDRIN MIGRAINE) 250-250-65 MG per tablet Take 1 tablet by mouth as needed.      . fexofenadine (ALLEGRA) 30 MG tablet Take 30 mg by mouth as needed.      . fish oil-omega-3 fatty acids 1000 MG capsule Take 2 g by mouth daily.      Marland Kitchen LORazepam (ATIVAN) 1 MG tablet Take 1 tablet (1 mg total) by mouth every 8 (eight) hours.  30 tablet  0  . Multiple Vitamin (MULTIVITAMIN) tablet Take 1 tablet by mouth daily.      . Naproxen Sodium (ALEVE PO) Take by mouth.      Marland Kitchen VITAMIN D, CHOLECALCIFEROL, PO Take 2,000 Units by mouth.        No current facility-administered medications for this encounter.   Facility-Administered Medications Ordered in Other Encounters  Medication Dose Route Frequency Provider Last Rate Last Dose  . sodium chloride 0.9 % injection 10 mL  10 mL Intracatheter PRN Pierce Crane, MD    10 mL at 12/25/11 1430     No Known Allergies  History   Social History  . Marital Status: Divorced    Spouse Name: N/A    Number of Children: 2  . Years of Education: N/A   Occupational History  .      Teaching laboratory technician   Social History Main Topics  . Smoking status: Former Smoker -- 1.00 packs/day    Types: Cigarettes    Quit date: 04/29/1971  . Smokeless tobacco: Never Used  . Alcohol Use: 0.0 oz/week    0-1 Glasses of wine per week     Comment: rarely social glass wine  . Drug Use: No  . Sexually Active: Not Currently    Birth Control/ Protection: None   Other Topics Concern  . Not on file   Social History Narrative  . No narrative on file    Family History  Problem Relation Age of Onset  . Diabetes Paternal Grandfather   . Heart disease Paternal Grandfather   . Breast cancer Maternal Grandmother   . Cancer Maternal Grandmother     breast  . Hypertension Father   . Stroke Father   . Breast cancer Maternal Aunt   . Cancer Maternal Aunt     breast  . Cancer Cousin 40    cervical or ovarian    PHYSICAL EXAM: Filed Vitals:   08/23/12 0958  BP: 180/100  Pulse: 85   General:  Well appearing. No respiratory difficulty HEENT: normal Neck: supple. no JVD. Carotids 2+ bilat; no bruits. No lymphadenopathy or thryomegaly appreciated. Cor: PMI nondisplaced. Regular rate & rhythm. No rubs, gallops or murmurs. Lungs: clear Abdomen: soft, nontender,  nondistended. No hepatosplenomegaly. No bruits or masses. Good bowel sounds. Extremities: no cyanosis, clubbing, rash, edema Neuro: alert & oriented x 3, cranial nerves grossly intact. moves all 4 extremities w/o difficulty. Affect pleasant.  No results found for this or any previous visit (from the past 24 hour(s)). No results found.   ASSESSMENT & PLAN:

## 2012-09-06 ENCOUNTER — Telehealth: Payer: Self-pay | Admitting: *Deleted

## 2012-09-06 ENCOUNTER — Telehealth (INDEPENDENT_AMBULATORY_CARE_PROVIDER_SITE_OTHER): Payer: Self-pay | Admitting: General Surgery

## 2012-09-06 ENCOUNTER — Ambulatory Visit (HOSPITAL_BASED_OUTPATIENT_CLINIC_OR_DEPARTMENT_OTHER): Payer: Medicaid Other | Admitting: Oncology

## 2012-09-06 ENCOUNTER — Telehealth (INDEPENDENT_AMBULATORY_CARE_PROVIDER_SITE_OTHER): Payer: Self-pay | Admitting: *Deleted

## 2012-09-06 ENCOUNTER — Ambulatory Visit (HOSPITAL_BASED_OUTPATIENT_CLINIC_OR_DEPARTMENT_OTHER): Payer: Medicaid Other

## 2012-09-06 ENCOUNTER — Other Ambulatory Visit (HOSPITAL_BASED_OUTPATIENT_CLINIC_OR_DEPARTMENT_OTHER): Payer: Medicaid Other

## 2012-09-06 VITALS — BP 141/93 | HR 91 | Temp 98.7°F | Resp 20 | Ht 64.0 in | Wt 179.6 lb

## 2012-09-06 DIAGNOSIS — C50411 Malignant neoplasm of upper-outer quadrant of right female breast: Secondary | ICD-10-CM

## 2012-09-06 DIAGNOSIS — C50419 Malignant neoplasm of upper-outer quadrant of unspecified female breast: Secondary | ICD-10-CM

## 2012-09-06 DIAGNOSIS — C50919 Malignant neoplasm of unspecified site of unspecified female breast: Secondary | ICD-10-CM

## 2012-09-06 DIAGNOSIS — Z5112 Encounter for antineoplastic immunotherapy: Secondary | ICD-10-CM

## 2012-09-06 DIAGNOSIS — Z170 Estrogen receptor positive status [ER+]: Secondary | ICD-10-CM

## 2012-09-06 DIAGNOSIS — M899 Disorder of bone, unspecified: Secondary | ICD-10-CM

## 2012-09-06 LAB — COMPREHENSIVE METABOLIC PANEL (CC13)
ALT: 17 U/L (ref 0–55)
AST: 17 U/L (ref 5–34)
CO2: 25 mEq/L (ref 22–29)
Calcium: 9.1 mg/dL (ref 8.4–10.4)
Chloride: 105 mEq/L (ref 98–107)
Potassium: 3.8 mEq/L (ref 3.5–5.1)
Sodium: 140 mEq/L (ref 136–145)
Total Protein: 7.1 g/dL (ref 6.4–8.3)

## 2012-09-06 LAB — CBC WITH DIFFERENTIAL/PLATELET
BASO%: 0.2 % (ref 0.0–2.0)
Eosinophils Absolute: 0.1 10*3/uL (ref 0.0–0.5)
LYMPH%: 34.6 % (ref 14.0–49.7)
MCHC: 34.2 g/dL (ref 31.5–36.0)
MONO#: 0.4 10*3/uL (ref 0.1–0.9)
NEUT#: 2.8 10*3/uL (ref 1.5–6.5)
Platelets: 213 10*3/uL (ref 145–400)
RBC: 4.49 10*6/uL (ref 3.70–5.45)
RDW: 12.6 % (ref 11.2–14.5)
WBC: 5.1 10*3/uL (ref 3.9–10.3)
lymph#: 1.8 10*3/uL (ref 0.9–3.3)
nRBC: 0 % (ref 0–0)

## 2012-09-06 MED ORDER — HEPARIN SOD (PORK) LOCK FLUSH 100 UNIT/ML IV SOLN
500.0000 [IU] | Freq: Once | INTRAVENOUS | Status: AC | PRN
  Administered 2012-09-06: 500 [IU]
  Filled 2012-09-06: qty 5

## 2012-09-06 MED ORDER — ACETAMINOPHEN 325 MG PO TABS
650.0000 mg | ORAL_TABLET | Freq: Once | ORAL | Status: AC
  Administered 2012-09-06: 650 mg via ORAL

## 2012-09-06 MED ORDER — SODIUM CHLORIDE 0.9 % IJ SOLN
10.0000 mL | INTRAMUSCULAR | Status: DC | PRN
  Administered 2012-09-06: 10 mL
  Filled 2012-09-06: qty 10

## 2012-09-06 MED ORDER — TRASTUZUMAB CHEMO INJECTION 440 MG
6.0000 mg/kg | Freq: Once | INTRAVENOUS | Status: AC
  Administered 2012-09-06: 462 mg via INTRAVENOUS
  Filled 2012-09-06: qty 22

## 2012-09-06 MED ORDER — SODIUM CHLORIDE 0.9 % IV SOLN
Freq: Once | INTRAVENOUS | Status: AC
  Administered 2012-09-06: 11:00:00 via INTRAVENOUS

## 2012-09-06 NOTE — Telephone Encounter (Signed)
LM for patient to call back and ask for me. To see if patient is okay with local or if she needs MAC for PAC removal. Awaiting call back.

## 2012-09-06 NOTE — Telephone Encounter (Signed)
appts made and printed. Pt is aware that i sw Dr. Jamey Ripa nurse Tresa Endo and they will give her call with app d/t to remove her port.Rachael Weaver

## 2012-09-06 NOTE — Telephone Encounter (Signed)
Dr. Darrall Dears office called to state it has been approved for patient to have PAC removed.  Asking to have it scheduled.

## 2012-09-06 NOTE — Patient Instructions (Addendum)
[facility] Discharge Instructions for Patients Receiving Chemotherapy  Today you received the following chemotherapy agents :  Herceptin.  To help prevent nausea and vomiting after your treatment, we encourage you to take your nausea medication as instructed by your physician.    If you develop nausea and vomiting that is not controlled by your nausea medication, call the clinic. If it is after clinic hours your family physician or the after hours number for the clinic or go to the Emergency Department.   BELOW ARE SYMPTOMS THAT SHOULD BE REPORTED IMMEDIATELY:  *FEVER GREATER THAN 100.5 F  *CHILLS WITH OR WITHOUT FEVER  NAUSEA AND VOMITING THAT IS NOT CONTROLLED WITH YOUR NAUSEA MEDICATION  *UNUSUAL SHORTNESS OF BREATH  *UNUSUAL BRUISING OR BLEEDING  TENDERNESS IN MOUTH AND THROAT WITH OR WITHOUT PRESENCE OF ULCERS  *URINARY PROBLEMS  *BOWEL PROBLEMS  UNUSUAL RASH Items with * indicate a potential emergency and should be followed up as soon as possible.  One of the nurses will contact you 24 hours after your treatment. Please let the nurse know about any problems that you may have experienced. Feel free to call the clinic you have any questions or concerns. The clinic phone number is [phone].   I have been informed and understand all the instructions given to me. I know to contact the clinic, my physician, or go to the Emergency Department if any problems should occur. I do not have any questions at this time, but understand that I may call the clinic during office hours   should I have any questions or need assistance in obtaining follow up care.    __________________________________________  _____________  __________ Signature of Patient or Authorized Representative            Date                   Time    __________________________________________ Nurse's Signature    

## 2012-09-06 NOTE — Telephone Encounter (Signed)
Message copied by Liliana Cline on Mon Sep 06, 2012  3:33 PM ------      Message from: Currie Paris      Created: Mon Sep 06, 2012  3:09 PM       Let me know if she is OK with local and I will put orders in      ----- Message -----         From: Lowella Dell, MD         Sent: 09/06/2012   9:56 AM           To: Currie Paris, MD            Thayer Ohm, just to let you know Ms. Welte is ready to have her port removed anytime that is convenient for both of you.       ------

## 2012-09-06 NOTE — Progress Notes (Signed)
ID: Rachael Weaver OB: 03-15-1951  MR#: [id number]  CSN#:[id number]  PCP: No PCP Per Patient GYN:   SU: [name] OTHER MD: [name]   HISTORY OF PRESENT ILLNESS:  From Dr [name]'s intake note 05/03/2012"  "Patient initially presented with increasing tenderness in her breasts. The pain seemed to be worse on the right which prompted a mammogram. The mammogram showed a 2.9 x 2.8 x 1.5 cm mass. 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 She was also noted to have an enlarged right axillary lymph node measuring 1.5 cm. She met with Dr. Pierce Crane who did recommend  neoadjuvant chemotherapy. However patient opted for a lumpectomy up front.  and on 08/21/2011 patient had a right breast lumpectomy with sentinel lymph node biopsy. The final pathology revealed a grade 3 invasive ductal carcinoma measuring 3.1 cm with associated high-grade ductal carcinoma in situ all margins were negative. Three sentinel nodes were negative for metastatic disease. The tumor was estrogen receptor +100% per just her receptor-positive 59% HER-2/neu was amplified with a ratio of 2.8 to Ki-67 was 66%."  Her subsequent history is as detailed below.  INTERVAL HISTORY: [name] returns today for followup of her breast cancer. She is completing her year of trastuzumab today. She is establishing herself in my practice.  REVIEW OF SYSTEMS: She has tolerated the Herceptin with no side effects that she is aware of. She is E. you're to get rid of her port. She was started on anastrozole late last year, and is generally tolerating that well. She does have vaginal dryness issues. She doesn't have joint aches and pains but does feel somewhat stiff in the morning, or after sitting for a long time. That improves with activity. She feels she is losing her hearing,  occasional sees a little blood on the tissue when she is a bowel movements, and feels a little bit forgetful. A detailed review of systems today was otherwise noncontributory  PAST MEDICAL HISTORY: Past Medical History  Diagnosis Date  . Hypertension     borderline- no meds  . Headache     USES OTC MEDS  . GERD (gastroesophageal reflux disease)   . Breast cancer     right breast  . Maintenance chemotherapy following disease   . Allergy     seasonal /rag weed  . Arthritis     hands feet  . Anxiety   . Cataract     no surgery yet  . Diabetes mellitus     when pregnant only 18 years ago,resolved    PAST SURGICAL HISTORY: Past Surgical History  Procedure Laterality Date  . Cesarean section    . Portacath placement  08/21/2011    Procedure: INSERTION PORT-A-CATH;  Surgeon: Currie Paris, MD;  Location: [facility];  Service: General;  Laterality: Left;  . Breast lumpectomy  08/21/11    right    FAMILY HISTORY Family History  Problem Relation Age of Onset  . Diabetes Paternal Grandfather   . Heart disease Paternal Grandfather   . Breast cancer Maternal Grandmother   . Cancer Maternal Grandmother     breast  . Hypertension Father   . Stroke Father   . Breast cancer Maternal Aunt   . Cancer Maternal Aunt     breast  . Cancer Cousin  40    cervical or ovarian   the patient's father died at the age of 80 following multiple strokes in the setting of untreated hypertension. The patient's mother died at the age of 30 with congestive heart failure. The patient's mother's mother was diagnosed with breast cancer in her 28s and one of the patient's mother's 2 sisters, one of the patient's maternal aunts, was diagnosed with breast cancer in her 86s. Savi herself has 5 brothers, no sisters. There is no other breast or ovarian cancer in the family to the patient's knowledge.  GYNECOLOGIC HISTORY:  Menarche age 62, first live birth age 62. The patient is GX P1. She went  through menopause age 62. She did not take hormones.  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 She is divorced. At home are her son Rachael Weaver, who is attending [employer] and working as a short order o'clock. Adopted daughter Rachael Weaver (originally from [place]) is still in high school.    ADVANCED DIRECTIVES: Not in place   HEALTH MAINTENANCE: History  Substance Use Topics  . Smoking status: Former Smoker -- 1.00 packs/day    Types: Cigarettes    Quit date: 04/29/1971  . Smokeless tobacco: Never Used  . Alcohol Use: 0.0 oz/week    0-1 Glasses of wine per week     Comment: rarely social glass wine     Colonoscopy:  PAP: March 2013  Bone density: November 2013  Lipid panel:  No Known Allergies  Current Outpatient Prescriptions  Medication Sig Dispense Refill  . anastrozole (ARIMIDEX) 1 MG tablet Take 1 tablet (1 mg total) by mouth daily.  30 tablet  6  . aspirin 81 MG tablet Take 81 mg by mouth daily.      Marland Kitchen aspirin-acetaminophen-caffeine (EXCEDRIN MIGRAINE) 250-250-65 MG per tablet Take 1 tablet by mouth as needed.      . fexofenadine (ALLEGRA) 30 MG tablet Take 30 mg by mouth as needed.      . fish oil-omega-3 fatty acids 1000 MG capsule Take 2 g by mouth daily.      Marland Kitchen LORazepam (ATIVAN) 1 MG tablet Take 1 tablet (1 mg total) by mouth every 8 (eight) hours.  30 tablet  0  . losartan (COZAAR) 50 MG tablet Take 1 tablet (50 mg total) by mouth daily.  30 tablet  3  . Multiple Vitamin (MULTIVITAMIN) tablet Take 1 tablet by mouth daily.      . Naproxen Sodium (ALEVE PO) Take by mouth.      Marland Kitchen VITAMIN D, CHOLECALCIFEROL, PO Take 2,000 Units by mouth.        No current facility-administered medications for this visit.   Facility-Administered Medications Ordered in Other Visits  Medication Dose Route Frequency Provider Last Rate Last Dose  . sodium chloride 0.9 % injection 10 mL  10 mL Intracatheter PRN Pierce Crane, MD   10 mL at 12/25/11 1430    OBJECTIVE: Middle-aged white  woman who appears well Filed Vitals:   09/06/12 0908  BP: 141/93  Pulse: 91  Temp: 98.7 F (37.1 C)  Resp: 20     Body mass index is 30.81 kg/(m^2).    ECOG FS: 0  Sclerae unicteric Oropharynx clear No cervical or supraclavicular adenopathy Lungs no rales or rhonchi Heart regular rate and rhythm Abd benign MSK no focal spinal tenderness, no peripheral edema Neuro: nonfocal, well oriented, pleasant affect Breasts: The right breast is status post surgery and radiation. There is no evidence of local recurrence.  This breast is slightly smaller and firmer than the left. The left breast is unremarkable. Both axillae are benign.   LAB RESULTS:  CMP     Component Value Date/Time   NA 142 08/16/2012 0916   NA 141 12/18/2011 1205   K 3.6 08/16/2012 0916   K 3.6 12/18/2011 1205   CL 105 08/16/2012 0916   CL 106 12/18/2011 1205   CO2 24 08/16/2012 0916   CO2 27 12/18/2011 1205   GLUCOSE 135* 08/16/2012 0916   GLUCOSE 98 12/18/2011 1205   BUN 17.0 08/16/2012 0916   BUN 15 12/18/2011 1205   CREATININE 0.9 08/16/2012 0916   CREATININE 0.83 12/18/2011 1205   CALCIUM 8.8 08/16/2012 0916   CALCIUM 9.2 12/18/2011 1205   PROT 6.8 08/16/2012 0916   PROT 6.7 12/18/2011 1205   ALBUMIN 3.6 08/16/2012 0916   ALBUMIN 4.4 12/18/2011 1205   AST 20 08/16/2012 0916   AST 23 12/18/2011 1205   ALT 18 08/16/2012 0916   ALT 31 12/18/2011 1205   ALKPHOS 106 08/16/2012 0916   ALKPHOS 83 12/18/2011 1205   BILITOT 0.38 08/16/2012 0916   BILITOT 0.5 12/18/2011 1205    I No results found for this basename: SPEP,  UPEP,   kappa and lambda light chains    Lab Results  Component Value Date   WBC 5.1 09/06/2012   NEUTROABS 2.8 09/06/2012   HGB 13.7 09/06/2012   HCT 40.1 09/06/2012   MCV 89.3 09/06/2012   PLT 213 09/06/2012      Chemistry      Component Value Date/Time   NA 142 08/16/2012 0916   NA 141 12/18/2011 1205   K 3.6 08/16/2012 0916   K 3.6 12/18/2011 1205   CL 105 08/16/2012 0916   CL 106 12/18/2011 1205   CO2  24 08/16/2012 0916   CO2 27 12/18/2011 1205   BUN 17.0 08/16/2012 0916   BUN 15 12/18/2011 1205   CREATININE 0.9 08/16/2012 0916   CREATININE 0.83 12/18/2011 1205      Component Value Date/Time   CALCIUM 8.8 08/16/2012 0916   CALCIUM 9.2 12/18/2011 1205   ALKPHOS 106 08/16/2012 0916   ALKPHOS 83 12/18/2011 1205   AST 20 08/16/2012 0916   AST 23 12/18/2011 1205   ALT 18 08/16/2012 0916   ALT 31 12/18/2011 1205   BILITOT 0.38 08/16/2012 0916   BILITOT 0.5 12/18/2011 1205       Lab Results  Component Value Date   LABCA2 33 07/30/2011    No components found with this basename: [id number]    No results found for this basename: INR,  in the last 168 hours  Urinalysis    Component Value Date/Time   LABSPEC 1.030 09/16/2011 1412    STUDIES: No results found.   ASSESSMENT: 62 y.o. [place] woman  (1) status post right lumpectomy and sentinel lymph node sampling 08/21/2011 for a pT2 pN0, stage IIA invasive ductal carcinoma, grade 3, estrogen receptor 100% and progesterone receptor 59% positive, with an MIB-1 of 66% and HER-2 amplification by CISH with a ratio of 2.82  (2) treated adjuvantly with carboplatin, docetaxel and trastuzumab x6 completed 12/18/2011  (3) completed adjuvant radiation 02/24/2012  (4) completed 1 year of trastuzumab 09/06/2012; echo 08/23/2012 shows a well-preserved ejection fraction  (5) started anastrozole September of 2013. DEXA scan 03/11/2012 showed minimal osteopenia (T score -1.2)  PLAN: We spent the better part of today's hour-long visit discussing Braylynn's past history, prognosis, and treatment options. She  is completing her trastuzumab today, and I have arranged for her to see Dr. Jamey Ripa next available to have her port removed. She is having mild symptoms from the anastrozole. We discussed the fact that when she completes 2 years on this drug she will have the option of switching to tamoxifen. She may or may not want to do that at that time. We're going to repeat  a bone density November of 2015, which will help [place] make that decision.  I have encouraged and to establish yourself with a primary care physician. She will see [place] again in December of this year and then June of 2015 after her May mammogram. We will check a CBC, C-met, and vitamin D 25-hydroxy level a week before each visit. She knows to call for any problems that may develop before that.   Lowella Dell, MD   09/06/2012 9:53 AM

## 2012-09-07 NOTE — Telephone Encounter (Signed)
LM again for patient to call.

## 2012-09-07 NOTE — Telephone Encounter (Signed)
Patient called back and stated local was fine.  Patient asked for it to be scheduled after the 28th since she has her mammogram scheduled for that day.

## 2012-09-07 NOTE — Telephone Encounter (Signed)
Dr Jamey Ripa please send orders- local okay.

## 2012-09-10 ENCOUNTER — Other Ambulatory Visit (INDEPENDENT_AMBULATORY_CARE_PROVIDER_SITE_OTHER): Payer: Self-pay | Admitting: Surgery

## 2012-10-12 ENCOUNTER — Other Ambulatory Visit: Payer: Self-pay | Admitting: Oncology

## 2012-10-12 ENCOUNTER — Ambulatory Visit
Admission: RE | Admit: 2012-10-12 | Discharge: 2012-10-12 | Disposition: A | Discharge status: Home / Self Care | Payer: No Typology Code available for payment source | Source: Ambulatory Visit | Attending: Oncology | Admitting: Oncology

## 2012-10-12 DIAGNOSIS — C50411 Malignant neoplasm of upper-outer quadrant of right female breast: Secondary | ICD-10-CM

## 2012-10-16 ENCOUNTER — Other Ambulatory Visit: Payer: Self-pay | Admitting: Oncology

## 2012-10-16 DIAGNOSIS — C50911 Malignant neoplasm of unspecified site of right female breast: Secondary | ICD-10-CM

## 2012-11-19 ENCOUNTER — Telehealth (INDEPENDENT_AMBULATORY_CARE_PROVIDER_SITE_OTHER): Payer: Self-pay

## 2012-11-19 NOTE — Telephone Encounter (Signed)
Patient states she is ready to move forward with  Port removal surgery, she has insurance coverage now. Please advise

## 2012-11-19 NOTE — Telephone Encounter (Signed)
Forward this message to Dr Tenna Child assistant Marney Doctor.

## 2012-11-20 ENCOUNTER — Other Ambulatory Visit (INDEPENDENT_AMBULATORY_CARE_PROVIDER_SITE_OTHER): Payer: Self-pay | Admitting: Surgery

## 2012-11-20 NOTE — Telephone Encounter (Signed)
I have put orders into Epic for port removal but I have not seen her for a year so need to see her before the surgery if performed

## 2012-11-24 ENCOUNTER — Telehealth (INDEPENDENT_AMBULATORY_CARE_PROVIDER_SITE_OTHER): Payer: Self-pay

## 2012-11-24 NOTE — Telephone Encounter (Signed)
LMOM for pt letting her know that I have scheduled her to come see Dr. Jamey Ripa on Wednedsday, Aug 20 @ 4pm to discuss port removal.

## 2012-12-15 ENCOUNTER — Encounter (INDEPENDENT_AMBULATORY_CARE_PROVIDER_SITE_OTHER): Payer: Self-pay | Admitting: Surgery

## 2012-12-17 ENCOUNTER — Other Ambulatory Visit (HOSPITAL_COMMUNITY): Payer: Self-pay | Admitting: Adult Health

## 2012-12-24 ENCOUNTER — Telehealth (HOSPITAL_COMMUNITY): Payer: Self-pay | Admitting: Cardiology

## 2012-12-24 NOTE — Telephone Encounter (Signed)
Attempting to contact pt for follow up Pt has stopped herceptin in may 2014. Pt would like to know if she still need office visit as she no longer has insurance and will have to pay for this out of pocket

## 2012-12-24 NOTE — Telephone Encounter (Signed)
Message copied by Shadawn Hanaway, Milagros Reap on Fri Dec 24, 2012  1:14 PM ------      Message from: Noralee Space      Created: Mon Dec 20, 2012 10:19 AM       Victoriano Lain' know if you have been trying to reach her to sch appt and echo or if she was missed for f/u but we saw her in April so she was due at the end of July, I put note on her refill she needs appt, can you also try to sch her thanks ------

## 2012-12-27 ENCOUNTER — Ambulatory Visit: Payer: Self-pay | Admitting: Internal Medicine

## 2012-12-27 VITALS — BP 128/80 | HR 79 | Temp 98.9°F | Resp 16 | Ht 64.0 in | Wt 179.2 lb

## 2012-12-27 DIAGNOSIS — L918 Other hypertrophic disorders of the skin: Secondary | ICD-10-CM

## 2012-12-27 DIAGNOSIS — L909 Atrophic disorder of skin, unspecified: Secondary | ICD-10-CM

## 2012-12-27 NOTE — Progress Notes (Signed)
  Subjective:    Patient ID: Rachael Weaver, female    DOB: [birth year], 62 y.o.   MRN: [id number]  HPI has noted a growth on the left upper eyelid over the last 2-3 weeks Has history of multiple skin tags require removal of the last several years Recently completed treatment for breast cancer-Dr. Darnelle Catalan    Review of Systems Noncontributory    Objective:   Physical Exam  Eyes:     BP 128/80  Pulse 79  Temp(Src) 98.9 F (37.2 C) (Oral)  Resp 16  Ht 5\' 4"  (1.626 m)  Wt 179 lb 3.2 oz (81.285 kg)  BMI 30.74 kg/m2  SpO2 98% Warty growth left upper eyelid with irritation at the base Sterile prep 0.5 cc 2% lidocaine at the base Snipped with iris scissors Hemostasis from pressure Slight bruising otherwise no complications       Assessment & Plan:  Problem #1 Horn wart removed Keep clean Followup when necessary

## 2013-01-20 NOTE — Telephone Encounter (Signed)
Per Dr Gala Romney no cardiomyopathy in the past ok to just f/u prn, pt aware

## 2013-02-08 ENCOUNTER — Ambulatory Visit: Payer: Self-pay | Admitting: Family Medicine

## 2013-02-23 ENCOUNTER — Other Ambulatory Visit (HOSPITAL_COMMUNITY): Payer: Self-pay | Admitting: Internal Medicine

## 2013-03-03 ENCOUNTER — Other Ambulatory Visit: Payer: Self-pay

## 2013-03-21 ENCOUNTER — Other Ambulatory Visit (HOSPITAL_BASED_OUTPATIENT_CLINIC_OR_DEPARTMENT_OTHER): Payer: Medicaid Other | Admitting: Lab

## 2013-03-21 DIAGNOSIS — C50919 Malignant neoplasm of unspecified site of unspecified female breast: Secondary | ICD-10-CM

## 2013-03-21 DIAGNOSIS — C50411 Malignant neoplasm of upper-outer quadrant of right female breast: Secondary | ICD-10-CM

## 2013-03-21 LAB — COMPREHENSIVE METABOLIC PANEL (CC13)
ALT: 18 U/L (ref 0–55)
AST: 22 U/L (ref 5–34)
Alkaline Phosphatase: 123 U/L (ref 40–150)
BUN: 15.2 mg/dL (ref 7.0–26.0)
Chloride: 108 mEq/L (ref 98–109)
Creatinine: 0.8 mg/dL (ref 0.6–1.1)
Potassium: 4.1 mEq/L (ref 3.5–5.1)

## 2013-03-21 LAB — CBC WITH DIFFERENTIAL/PLATELET
BASO%: 0.7 % (ref 0.0–2.0)
EOS%: 2.1 % (ref 0.0–7.0)
HCT: 40.7 % (ref 34.8–46.6)
LYMPH%: 34.8 % (ref 14.0–49.7)
MCH: 30.9 pg (ref 25.1–34.0)
MCHC: 33.8 g/dL (ref 31.5–36.0)
MONO#: 0.5 10*3/uL (ref 0.1–0.9)
NEUT%: 53.9 % (ref 38.4–76.8)
RBC: 4.45 10*6/uL (ref 3.70–5.45)
WBC: 5.5 10*3/uL (ref 3.9–10.3)
lymph#: 1.9 10*3/uL (ref 0.9–3.3)

## 2013-03-21 LAB — VITAMIN D 25 HYDROXY (VIT D DEFICIENCY, FRACTURES): Vit D, 25-Hydroxy: 55 ng/mL (ref 30–89)

## 2013-03-28 ENCOUNTER — Telehealth: Payer: Self-pay | Admitting: Oncology

## 2013-03-28 ENCOUNTER — Encounter: Payer: Self-pay | Admitting: Oncology

## 2013-03-28 ENCOUNTER — Ambulatory Visit (HOSPITAL_BASED_OUTPATIENT_CLINIC_OR_DEPARTMENT_OTHER): Payer: Self-pay | Admitting: Physician Assistant

## 2013-03-28 ENCOUNTER — Encounter: Payer: Self-pay | Admitting: Physician Assistant

## 2013-03-28 ENCOUNTER — Telehealth (HOSPITAL_COMMUNITY): Payer: Self-pay | Admitting: *Deleted

## 2013-03-28 DIAGNOSIS — R5383 Other fatigue: Secondary | ICD-10-CM

## 2013-03-28 DIAGNOSIS — R5381 Other malaise: Secondary | ICD-10-CM

## 2013-03-28 DIAGNOSIS — Z780 Asymptomatic menopausal state: Secondary | ICD-10-CM

## 2013-03-28 DIAGNOSIS — IMO0001 Reserved for inherently not codable concepts without codable children: Secondary | ICD-10-CM

## 2013-03-28 DIAGNOSIS — Z170 Estrogen receptor positive status [ER+]: Secondary | ICD-10-CM

## 2013-03-28 DIAGNOSIS — M899 Disorder of bone, unspecified: Secondary | ICD-10-CM

## 2013-03-28 DIAGNOSIS — Z853 Personal history of malignant neoplasm of breast: Secondary | ICD-10-CM

## 2013-03-28 DIAGNOSIS — R413 Other amnesia: Secondary | ICD-10-CM

## 2013-03-28 DIAGNOSIS — E559 Vitamin D deficiency, unspecified: Secondary | ICD-10-CM

## 2013-03-28 DIAGNOSIS — M791 Myalgia, unspecified site: Secondary | ICD-10-CM

## 2013-03-28 DIAGNOSIS — C50411 Malignant neoplasm of upper-outer quadrant of right female breast: Secondary | ICD-10-CM

## 2013-03-28 DIAGNOSIS — G4700 Insomnia, unspecified: Secondary | ICD-10-CM

## 2013-03-28 DIAGNOSIS — C50919 Malignant neoplasm of unspecified site of unspecified female breast: Secondary | ICD-10-CM

## 2013-03-28 MED ORDER — LORAZEPAM 0.5 MG PO TABS: 0.5000 mg | ORAL_TABLET | Freq: Every evening | ORAL | Status: DC | PRN

## 2013-03-28 NOTE — Telephone Encounter (Signed)
Patient called me and left a voicemail in regards to her [facility] [employer]. Called patient back and let her know that she is no longer eligible for [facility] [employer] since not in active treatment. Told patient if we get another [place] next year that she will be eligible for the [place] [employer] for her yearly diagnostic mammogram due in June 2015. Patient stated wasn't worried about the mammogram but the other appointments. Let her know she can always contact Medicaid to find out if their is any other options. Patient verbalized understanding.

## 2013-03-28 NOTE — Progress Notes (Signed)
ID: Rachael Weaver OB: [birth year]  MR#: [id number]  CSN#:[id number]  PCP: No PCP Per Patient GYN:   SU: [name] OTHER MD: [name]  CHIEF COMPLAINT:  Right Breast Cancer    HISTORY OF PRESENT ILLNESS:  From Dr [name]'s intake note 05/03/2012:  "Patient initially presented with increasing tenderness in her breasts. The pain seemed to be worse on the right which prompted a mammogram. The mammogram showed a 2.9 x 2.8 x 1.5 cm mass. 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 She was also noted to have an enlarged right axillary lymph node measuring 1.5 cm. She met with Dr. Pierce Crane who did recommend  neoadjuvant chemotherapy. However patient opted for a lumpectomy up front.  and on 08/21/2011 patient had a right breast lumpectomy with sentinel lymph node biopsy. The final pathology revealed a grade 3 invasive ductal carcinoma measuring 3.1 cm with associated high-grade ductal carcinoma in situ all margins were negative. Three sentinel nodes were negative for metastatic disease. The tumor was estrogen receptor +100% per just her receptor-positive 59% HER-2/neu was amplified with a ratio of 2.8 to Ki-67 was 66%."  Her subsequent history is as detailed below.  INTERVAL HISTORY: Rachael Weaver returns alone today for followup of her right breast cancer. She completed her year of Herceptin therapy in May of this year, and continues on anastrozole.  She's begun to have more side effects, many of which she attributes to the anastrozole. She noticed no side effects from the prior first 4-6 months on the medication. She then had increased hot flashes although those seem to have improved once again.  She denies joint pain but has significant muscle pain diffusely, especially after any activity. She feels increasingly fatigued. She feels  like she has "brain fog" and has noticed problems with her short term memory.   Rachael Weaver continues to be a very busy single mom. 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 She feels anxious at times, especially at night, and has some difficulty sleeping. She takes a low dose of lorazepam on occasion, as other things make her feel "too strung out" in the morning. She does request a refill on the lorazepam today.   Although she admits that she is currently not exercising regularly, she walked 3 miles per day regularly over the summer.  She is frustrated with the fact that she has been unable to lose weight.  I will also mention that and still has her port on the left side, and it has not been flushed since May of this year. The plan had been to have it removed in May or June, but she has had some problems with her insurance. Financially, she has simply not been able to schedule that procedure. She does tell me that she wants it out and will have it removed by early January "regardless of what it costs".    REVIEW OF SYSTEMS: Rachael Weaver has had no recent illnesses and denies any fevers or chills. She's had no skin changes or rashes. She denies any abnormal bruising or bleeding. She's had no excessive vaginal dryness. She denies any nausea or emesis and is having regular bowel movements. No change in urinary habits. She's had no abnormal headaches, dizziness, or change in vision. She  denies any peripheral swelling.  A detailed review of systems is otherwise stable and noncontributory.   PAST MEDICAL HISTORY: Past Medical History  Diagnosis Date  . Hypertension     borderline- no meds  . Headache(784.0)     USES OTC MEDS  . GERD (gastroesophageal reflux disease)   . Breast cancer     right breast  . Maintenance chemotherapy following disease   . Allergy     seasonal /rag weed  . Arthritis     hands feet  . Anxiety   . Cataract     no surgery yet  . Diabetes  mellitus     when pregnant only 18 years ago,resolved  . Cancer     PAST SURGICAL HISTORY: Past Surgical History  Procedure Laterality Date  . Cesarean section    . Portacath placement  08/21/2011    Procedure: INSERTION PORT-A-CATH;  Surgeon: Currie Paris, MD;  Location: [facility];  Service: General;  Laterality: Left;  . Breast lumpectomy  08/21/11    right    FAMILY HISTORY Family History  Problem Relation Age of Onset  . Diabetes Paternal Grandfather   . Heart disease Paternal Grandfather   . Breast cancer Maternal Grandmother   . Cancer Maternal Grandmother     breast  . Hypertension Father   . Stroke Father   . Breast cancer Maternal Aunt   . Cancer Maternal Aunt     breast  . Cancer Cousin 40    cervical or ovarian   the patient's father died at the age of 14 following multiple strokes in the setting of untreated hypertension. The patient's mother died at the age of 10 with congestive heart failure. The patient's mother's mother was diagnosed with breast cancer in her 77s and one of the patient's mother's 2 sisters, one of the patient's maternal aunts, was diagnosed with breast cancer in her 47s. Rachael Weaver herself has 5 brothers, no sisters. There is no other breast or ovarian cancer in the family to the patient's knowledge.  GYNECOLOGIC HISTORY:   (Updated 03/28/2013) Menarche age 61, first live birth age 57. The patient is GX P1. She went through menopause age 27. She did not take hormones.  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 She is divorced. At home is her adopted daughter Rachael Weaver (originally from [place]) who will soon be 57.  Her son Rachael Weaver is currently living in [place].   ADVANCED DIRECTIVES: Not in place   HEALTH MAINTENANCE: (Updated 03/28/2013) History  Substance Use Topics  . Smoking status: Former Smoker -- 1.00 packs/day    Types: Cigarettes    Quit date: 04/29/1971  . Smokeless tobacco: Never Used   . Alcohol Use: 0.0 oz/week    0-1 Glasses of wine per week     Comment: rarely social glass wine     Colonoscopy: Not on file  PAP: March 2013  Bone density: November 2013, osteopenia  Lipid panel: Not on file   No Known Allergies  Current Outpatient Prescriptions  Medication Sig Dispense Refill  . anastrozole (ARIMIDEX) 1 MG tablet TAKE 1 TABLET BY MOUTH DAILY  30 tablet  5  . aspirin 81 MG tablet Take 81 mg by mouth daily.      Marland Kitchen aspirin-acetaminophen-caffeine (EXCEDRIN MIGRAINE) 250-250-65 MG per tablet Take 1 tablet by mouth as needed.      . fexofenadine (ALLEGRA) 30 MG tablet Take 30 mg by mouth as needed.      Marland Kitchen  LORazepam (ATIVAN) 0.5 MG tablet Take 1 tablet (0.5 mg total) by mouth at bedtime as needed for sleep.  30 tablet  0  . losartan (COZAAR) 50 MG tablet TAKE 1 TABLET BY MOUTH ONCE A DAY  30 tablet  3  . Multiple Vitamin (MULTIVITAMIN) tablet Take 1 tablet by mouth daily.      Marland Kitchen VITAMIN D, CHOLECALCIFEROL, PO Take 4,000 Units by mouth daily.       . fish oil-omega-3 fatty acids 1000 MG capsule Take 2 g by mouth daily.      . Naproxen Sodium (ALEVE PO) Take by mouth.       No current facility-administered medications for this visit.   Facility-Administered Medications Ordered in Other Visits  Medication Dose Route Frequency Provider Last Rate Last Dose  . sodium chloride 0.9 % injection 10 mL  10 mL Intracatheter PRN Pierce Crane, MD   10 mL at 12/25/11 1430    OBJECTIVE: Middle-aged white woman  in no acute distress  Vitals Blood Pressure: 159/79 Pulse: 101 Resp: 18 Temp: 98.8 Height: 5\' 4"  Weight: 183.8 lb BMI: 31.1      ECOG: 1 Physical Exam: HEENT:  Sclerae anicteric.  Oropharynx clear. Fair dentition. Buccal mucosa is pink and moist. NODES:  No cervical or supraclavicular lymphadenopathy palpated.  BREAST EXAM:  Right breast is status post lumpectomy and radiation, slightly tender to palpation, with no suspicious nodularities or skin changes. No evidence  of local recurrence. Left breast is unremarkable. Axillae are benign bilaterally, no palpable lymphadenopathy. LUNGS:  Clear to auscultation bilaterally.  No wheezes or rhonchi. HEART:  Regular rate and rhythm. No murmur  ABDOMEN:  Soft, nontender. No organomegaly palpated.  Positive bowel sounds.  MSK:  No focal spinal tenderness to palpation. Full range of motion bilaterally in the upper extremities. No joint swelling. EXTREMITIES:  No peripheral edema.   NEURO:  Nonfocal. Well oriented.  Appropriate affect.     LAB RESULTS:   Lab Results  Component Value Date   WBC 5.5 03/21/2013   NEUTROABS 3.0 03/21/2013   HGB 13.8 03/21/2013   HCT 40.7 03/21/2013   MCV 91.5 03/21/2013   PLT 253 03/21/2013      Chemistry      Component Value Date/Time   NA 141 03/21/2013 0920   NA 141 12/18/2011 1205   K 4.1 03/21/2013 0920   K 3.6 12/18/2011 1205   CL 105 09/06/2012 0852   CL 106 12/18/2011 1205   CO2 22 03/21/2013 0920   CO2 27 12/18/2011 1205   BUN 15.2 03/21/2013 0920   BUN 15 12/18/2011 1205   CREATININE 0.8 03/21/2013 0920   CREATININE 0.83 12/18/2011 1205      Component Value Date/Time   CALCIUM 9.5 03/21/2013 0920   CALCIUM 9.2 12/18/2011 1205   ALKPHOS 123 03/21/2013 0920   ALKPHOS 83 12/18/2011 1205   AST 22 03/21/2013 0920   AST 23 12/18/2011 1205   ALT 18 03/21/2013 0920   ALT 31 12/18/2011 1205   BILITOT 0.40 03/21/2013 0920   BILITOT 0.5 12/18/2011 1205      STUDIES:   Most recent bone density 03/11/2012 showed osteopenia with a T score of -1.2.  Bilateral mammogram on 10/12/2012 was unremarkable.     ASSESSMENT: 62 y.o. [place] woman  (1) status post right lumpectomy and sentinel lymph node sampling 08/21/2011 for a pT2 pN0, stage IIA invasive ductal carcinoma, grade 3, estrogen receptor 100% and progesterone receptor 59% positive, with an MIB-1 of  66% and HER-2 amplification by CISH with a ratio of 2.82  (2) treated adjuvantly with carboplatin, docetaxel  and trastuzumab x6 completed 12/18/2011  (3) completed adjuvant radiation 02/24/2012  (4) completed 1 year of trastuzumab 09/06/2012; echo 08/23/2012 shows a well-preserved ejection fraction  (5) started anastrozole September of 2013.  (6)  DEXA scan 03/11/2012 showed minimal osteopenia (T score -1.2)  PLAN:  Over half of our 30 minute appointment today was spent reviewing Rachael Weaver's concerns many of which she feels are associated with the anastrozole. She has wondered if switching from the generic to the brand name would decrease side effects.   We discussed the possibility of holding the medication for approximately 4 weeks, then reassess her muscle pain, problems with memory, and fatigue.  she is very open to this idea, and accordingly we will hold the anastrozole until early January. I will see her again at that time to assess all of her above concerns. If she has seen improvement we will consider a different anti-estrogen therapy, perhaps exemestane. If she has seen no improvement, we will likely resume the anastrozole and she tells me she will check with the company to see if she can get the brand name medication. (She has been told there is a program where she could get the brand name medication through the pharm company for approximately $20 per month compared to the $13 she is really paying for the generic.)   We'll also refer her back to her surgeon, Dr. Jamey Ripa, for port removal at her earliest convenience. She is due for her next mammogram in June of next year, after which she will see Dr. Darnelle Catalan for routine followup.   If she continues on the anastrozole for 2 years, she would have the option of switching to tamoxifen in late 2015. Her next bone density is due in November of 2015, and those results might help [place] make that decision if her osteopenia has worsened.  Rachael Weaver voices understanding and agreement with the above plan, and as always knows to call with any changes or problems.   Of  note, I did refill her lorazepam times one, but reduce the dose from 1 mg to 0.5 mg with instructions to take 1 at bedtime as needed for sleep. Also recommended that she alternate the lorazepam with a low dose of Benadryl, 12.5 mg, to see if this causes sleepiness but prevents a "hung over" feeling the next day.   Rachael Tauzin, PA-C   03/28/2013 10:41 AM

## 2013-03-28 NOTE — Progress Notes (Signed)
Patient said she will be getting with Wynona Canes to get [facility] again. She knows she is self pay now.

## 2013-03-29 ENCOUNTER — Other Ambulatory Visit (INDEPENDENT_AMBULATORY_CARE_PROVIDER_SITE_OTHER): Payer: Self-pay | Admitting: Surgery

## 2013-03-29 ENCOUNTER — Telehealth (INDEPENDENT_AMBULATORY_CARE_PROVIDER_SITE_OTHER): Payer: Self-pay | Admitting: General Surgery

## 2013-03-29 NOTE — Telephone Encounter (Signed)
Message copied by Liliana Cline on Tue Mar 29, 2013  8:55 AM ------      Message from: Currie Paris      Created: Tue Mar 29, 2013  8:47 AM       See if she wants me to schedule port removal before year end, and if so, if she is OK with local.       ----- Message -----         From: Catalina Gravel, PA-C         Sent: 03/29/2013   8:40 AM           To: Currie Paris, MD                   ------

## 2013-03-29 NOTE — Telephone Encounter (Signed)
Spoke with patient - she would like to have PAC removed prior to the end of the year. She is okay with local anesthesia. Please send orders to schedulers.

## 2013-04-13 ENCOUNTER — Encounter (HOSPITAL_BASED_OUTPATIENT_CLINIC_OR_DEPARTMENT_OTHER)
Admission: RE | Disposition: A | Discharge status: Home / Self Care | Payer: Self-pay | Source: Ambulatory Visit | Attending: Surgery

## 2013-04-13 ENCOUNTER — Ambulatory Visit (HOSPITAL_BASED_OUTPATIENT_CLINIC_OR_DEPARTMENT_OTHER)
Admission: RE | Admit: 2013-04-13 | Discharge: 2013-04-13 | Disposition: A | Discharge status: Home / Self Care | Payer: Self-pay | Source: Ambulatory Visit | Attending: Surgery | Admitting: Surgery

## 2013-04-13 ENCOUNTER — Encounter (HOSPITAL_BASED_OUTPATIENT_CLINIC_OR_DEPARTMENT_OTHER): Payer: Self-pay | Admitting: *Deleted

## 2013-04-13 DIAGNOSIS — C50911 Malignant neoplasm of unspecified site of right female breast: Secondary | ICD-10-CM

## 2013-04-13 DIAGNOSIS — Z452 Encounter for adjustment and management of vascular access device: Secondary | ICD-10-CM

## 2013-04-13 DIAGNOSIS — Z853 Personal history of malignant neoplasm of breast: Secondary | ICD-10-CM | POA: Insufficient documentation

## 2013-04-13 HISTORY — PX: PORT-A-CATH REMOVAL: SHX5289

## 2013-04-13 SURGERY — MINOR REMOVAL PORT-A-CATH
Anesthesia: LOCAL | Site: Chest

## 2013-04-13 MED ORDER — SODIUM BICARBONATE 4 % IV SOLN
INTRAVENOUS | Status: AC
  Filled 2013-04-13: qty 5

## 2013-04-13 MED ORDER — LIDOCAINE HCL (PF) 1 % IJ SOLN
INTRAMUSCULAR | Status: AC
  Filled 2013-04-13: qty 30

## 2013-04-13 MED ORDER — LIDOCAINE-EPINEPHRINE (PF) 1 %-1:200000 IJ SOLN
INTRAMUSCULAR | Status: AC
  Filled 2013-04-13: qty 10

## 2013-04-13 MED ORDER — SODIUM BICARBONATE 4 % IV SOLN
INTRAVENOUS | Status: DC | PRN
  Administered 2013-04-13: 11:00:00

## 2013-04-13 SURGICAL SUPPLY — 29 items
BENZOIN TINCTURE PRP APPL 2/3 (GAUZE/BANDAGES/DRESSINGS) IMPLANT
BLADE SURG 15 STRL LF DISP TIS (BLADE) ×1 IMPLANT
BLADE SURG 15 STRL SS (BLADE) ×1
CHLORAPREP W/TINT 26ML (MISCELLANEOUS) ×2 IMPLANT
DERMABOND ADVANCED (GAUZE/BANDAGES/DRESSINGS) ×1
DERMABOND ADVANCED .7 DNX12 (GAUZE/BANDAGES/DRESSINGS) ×1 IMPLANT
DRSG TEGADERM 4X4.75 (GAUZE/BANDAGES/DRESSINGS) IMPLANT
ELECT REM PT RETURN 9FT ADLT (ELECTROSURGICAL)
ELECTRODE REM PT RTRN 9FT ADLT (ELECTROSURGICAL) IMPLANT
GAUZE SPONGE 4X4 16PLY XRAY LF (GAUZE/BANDAGES/DRESSINGS) ×2 IMPLANT
GLOVE BIOGEL M STRL SZ7.5 (GLOVE) ×2 IMPLANT
GLOVE BIOGEL PI IND STRL 7.0 (GLOVE) ×1 IMPLANT
GLOVE BIOGEL PI INDICATOR 7.0 (GLOVE) ×1
GLOVE ECLIPSE 6.5 STRL STRAW (GLOVE) ×2 IMPLANT
GLOVE EUDERMIC 7 POWDERFREE (GLOVE) ×2 IMPLANT
MARKER SKIN DUAL TIP RULER LAB (MISCELLANEOUS) IMPLANT
NDL SAFETY ECLIPSE 18X1.5 (NEEDLE) ×1 IMPLANT
NEEDLE HYPO 18GX1.5 SHARP (NEEDLE) ×1
NEEDLE HYPO 25X1 1.5 SAFETY (NEEDLE) ×2 IMPLANT
PENCIL BUTTON HOLSTER BLD 10FT (ELECTRODE) IMPLANT
SPONGE GAUZE 4X4 12PLY STER LF (GAUZE/BANDAGES/DRESSINGS) IMPLANT
SPONGE LAP 4X18 X RAY DECT (DISPOSABLE) ×2 IMPLANT
STRIP CLOSURE SKIN 1/2X4 (GAUZE/BANDAGES/DRESSINGS) IMPLANT
SUT MNCRL AB 4-0 PS2 18 (SUTURE) ×2 IMPLANT
SUT VIC AB 4-0 P-3 18XBRD (SUTURE) IMPLANT
SUT VIC AB 4-0 P3 18 (SUTURE)
SUT VIC AB 4-0 SH 18 (SUTURE) IMPLANT
SUT VICRYL 3-0 CR8 SH (SUTURE) ×2 IMPLANT
SYR CONTROL 10ML LL (SYRINGE) ×2 IMPLANT

## 2013-04-13 NOTE — H&P (Cosign Needed)
  The patient presents today for Port-A-Cath removal under local anesthesia. His complete all of her chemotherapy for her breast cancer. She considers herself in good health and has had no medical issues.  Past medical and family history etc. Are all in the electronic medical record, reviewed, but not placed in this note.  I discussed the procedure with her and answer questions. She is ready to proceed.

## 2013-04-13 NOTE — OR Nursing (Signed)
Patient verbalized understanding to come to hospital for bleeding from site.  She also verbalized understanding of signs of infection--fever, redness, swelling, or drainage from site.  She verbalized understanding to call surgeon for any signs of infection or bleeding or other problems related to port removal.

## 2013-04-13 NOTE — Op Note (Signed)
Rachael Weaver 16-Feb-1951 [id number] 04/13/2013   Preoperative diagnosis: Un-Needed PAC  Postoperative diagnosis: Same  Procedure: Portacath Removal  Surgeon: Currie Paris, MD, FACS  Anesthesia:local   Clinical History and Indications: The patient has finished her chemotherapy and no longer needs a port. She wishes to have it removed.  Procedure: The patient was seen in the preoperative area and we confirmed the plans for the procedure as noted above. The Port-A-Cath site was identified and marked. The patient had no further questions.  The patient was then taken into the procedure room. The timeout was done. The area over the Port-A-Cath was anesthetized with 1% Xylocaine with epinephrine. I waited about 10 minutes and then the area was prepped and draped.  The old scar was opened. The capsule around the port opened and the port identified. The holding sutures were cut. The catheter was backed partially out of its tract. A figure 8 3-0 Vicryl suture was placed, the tubing removed, and the suture tied down to prevent backbleeding.  The port was then removed from its pocket. I made sure everything was dry. The incision was closed with 3-0 Vicryl, 4-0 Monocryl subcuticular, and Dermabond.  The patient tolerated the procedure well. There were no complications.  Currie Paris, MD, FACS 04/13/2013 11:15 AM

## 2013-04-15 ENCOUNTER — Encounter (HOSPITAL_BASED_OUTPATIENT_CLINIC_OR_DEPARTMENT_OTHER): Payer: Self-pay | Admitting: Surgery

## 2013-05-03 ENCOUNTER — Telehealth: Payer: Self-pay | Admitting: *Deleted

## 2013-05-03 ENCOUNTER — Ambulatory Visit (HOSPITAL_BASED_OUTPATIENT_CLINIC_OR_DEPARTMENT_OTHER): Payer: Self-pay | Admitting: Physician Assistant

## 2013-05-03 ENCOUNTER — Encounter: Payer: Self-pay | Admitting: Physician Assistant

## 2013-05-03 VITALS — BP 123/76 | HR 83 | Temp 98.1°F | Resp 18 | Ht 64.0 in | Wt 185.3 lb

## 2013-05-03 DIAGNOSIS — F3289 Other specified depressive episodes: Secondary | ICD-10-CM

## 2013-05-03 DIAGNOSIS — F32A Depression, unspecified: Secondary | ICD-10-CM | POA: Insufficient documentation

## 2013-05-03 DIAGNOSIS — F329 Major depressive disorder, single episode, unspecified: Secondary | ICD-10-CM

## 2013-05-03 DIAGNOSIS — C50411 Malignant neoplasm of upper-outer quadrant of right female breast: Secondary | ICD-10-CM

## 2013-05-03 DIAGNOSIS — Z170 Estrogen receptor positive status [ER+]: Secondary | ICD-10-CM

## 2013-05-03 DIAGNOSIS — M949 Disorder of cartilage, unspecified: Secondary | ICD-10-CM

## 2013-05-03 DIAGNOSIS — C50919 Malignant neoplasm of unspecified site of unspecified female breast: Secondary | ICD-10-CM

## 2013-05-03 DIAGNOSIS — M899 Disorder of bone, unspecified: Secondary | ICD-10-CM

## 2013-05-03 HISTORY — DX: Depression, unspecified: F32.A

## 2013-05-03 MED ORDER — CITALOPRAM HYDROBROMIDE 10 MG PO TABS: 10.0000 mg | ORAL_TABLET | Freq: Every day | ORAL | Status: DC

## 2013-05-03 NOTE — Progress Notes (Signed)
ID: Rachael Weaver OB: 16-Jan-1951  MR#: [id number]  CHE#:[id number]  PCP: No PCP Per Patient GYN:   SU: Osborn Coho OTHER MD: Thea Silversmith  CHIEF COMPLAINT:  Right Breast Cancer    HISTORY OF PRESENT ILLNESS:  From Dr Bernell List Khan's intake note 05/03/2012:  "Patient initially presented with increasing tenderness in her breasts. The pain seemed to be worse on the right which prompted a mammogram. The mammogram showed a 2.9 x 2.8 x 1.5 cm mass. 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 She was also noted to have an enlarged right axillary lymph node measuring 1.5 cm. She met with Dr. Eston Esters who did recommend  neoadjuvant chemotherapy. However patient opted for a lumpectomy up front.  and on 08/21/2011 patient had a right breast lumpectomy with sentinel lymph node biopsy. The final pathology revealed a grade 3 invasive ductal carcinoma measuring 3.1 cm with associated high-grade ductal carcinoma in situ all margins were negative. Three sentinel nodes were negative for metastatic disease. The tumor was estrogen receptor +100% per just her receptor-positive 59% HER-2/neu was amplified with a ratio of 2.8 to Ki-67 was 66%."  Her subsequent history is as detailed below.  INTERVAL HISTORY: Rachael Weaver returns alone today for followup of her right breast cancer. She was on anastrozole, having begun the drug in September of 2003, when I saw her in early December 2014. At that time, she was complaining of multiple side effects which she treated to the drug. These included increased hot flashes, joint pain, stiffness, muscle pain, fatigue, "brain fog" and problems with her short-term memory. She went off of the anastrozole in early December and is here today for followup visit and reassessment.  Rachael Weaver tells me that within days of discontinuing  the anastrozole she felt like there had been a "weight lifted". She currently denies any problems with hot flashes whatsoever and tells me that her muscle and joint pains have improved by at least 80%. She tells me she is "much more like herself", and even her teenage daughter has noticed a difference.    The improvement in Rachael Weaver's physical state, however, has made her realize the fact that she is experiencing some depression.  While she feels better physically, emotionally she still feels drained.  She has been on both Prozac and Paxil in the past, but tells me that made her feel too sleepy. She would like to consider trying a different antidepressant.  Rachael Weaver continues to stay very busy as a single mom, works full-time, and has started exercising again on a regular basis. Interval history is also notable for the fact that she had her port removed under the care of Dr. Margot Chimes in mid December with no complications.  REVIEW OF SYSTEMS: Rachael Weaver denies any recent illnesses, fevers or chills. She's had no skin changes or rashes. She denies any abnormal bruising or bleeding. She's had no excessive vaginal dryness and no vaginal bleeding. Her appetite is good. She denies any nausea or emesis and is having regular bowel movements. No change in urinary habits. She's had no abnormal headaches, dizziness, or change in vision. She denies any peripheral swelling.  A detailed review of systems is otherwise stable and noncontributory.   PAST MEDICAL HISTORY: Past Medical History  Diagnosis Date  . Hypertension     borderline-  no meds  . Headache(784.0)     USES OTC MEDS  . GERD (gastroesophageal reflux disease)   . Breast cancer     right breast  . Maintenance chemotherapy following disease   . Allergy     seasonal /rag weed  . Arthritis     hands feet  . Anxiety   . Cataract     no surgery yet  . Diabetes mellitus     when pregnant only 18 years ago,resolved  . Cancer   . Depression 05/03/2013    PAST  SURGICAL HISTORY: Past Surgical History  Procedure Laterality Date  . Cesarean section    . Portacath placement  08/21/2011    Procedure: INSERTION PORT-A-CATH;  Surgeon: Currie Paris, MD;  Location: [facility];  Service: General;  Laterality: Left;  . Breast lumpectomy  08/21/11    right  . Port-a-cath removal N/A 04/13/2013    Procedure: MINOR REMOVAL PORT-A-CATH;  Surgeon: Currie Paris, MD;  Location: [facility];  Service: General;  Laterality: N/A;    FAMILY HISTORY Family History  Problem Relation Age of Onset  . Diabetes Paternal Grandfather   . Heart disease Paternal Grandfather   . Breast cancer Maternal Grandmother   . Cancer Maternal Grandmother     breast  . Hypertension Father   . Stroke Father   . Breast cancer Maternal Aunt   . Cancer Maternal Aunt     breast  . Cancer Cousin 40    cervical or ovarian   the patient's father died at the age of 23 following multiple strokes in the setting of untreated hypertension. The patient's mother died at the age of 63 with congestive heart failure. The patient's mother's mother was diagnosed with breast cancer in her 10s and one of the patient's mother's 2 sisters, one of the patient's maternal aunts, was diagnosed with breast cancer in her 63s. Rachael Weaver herself has 5 brothers, no sisters. There is no other breast or ovarian cancer in the family to the patient's knowledge.  GYNECOLOGIC HISTORY:   (Updated 03/28/2013) Menarche age 14, first live birth age 31. The patient is GX P1. She went through menopause age 54. She did not take hormones.  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 She is divorced. At home is her adopted daughter Rachael Weaver (originally from [place]) who will soon be 83.  Her son Rachael Weaver is currently living in [place].   ADVANCED DIRECTIVES: Not in place   HEALTH MAINTENANCE: (Updated 03/28/2013) History  Substance Use Topics  . Smoking status:  Former Smoker -- 1.00 packs/day    Types: Cigarettes    Quit date: 04/29/1971  . Smokeless tobacco: Never Used  . Alcohol Use: 0.0 oz/week    0-1 Glasses of wine per week     Comment: rarely social glass wine     Colonoscopy: Not on file  PAP: March 2013  Bone density: November 2013, osteopenia  Lipid panel: Not on file   No Known Allergies  Current Outpatient Prescriptions  Medication Sig Dispense Refill  . aspirin 81 MG tablet Take 81 mg by mouth daily.      Marland Kitchen b complex vitamins tablet Take 1 tablet by mouth daily.      Marland Kitchen BIOTIN PO Take 1 capsule by mouth daily.      . cyanocobalamin 100 MCG tablet Take 100 mcg by mouth daily.      Marland Kitchen losartan (COZAAR) 50 MG tablet TAKE  1 TABLET BY MOUTH ONCE A DAY  30 tablet  3  . VITAMIN D, CHOLECALCIFEROL, PO Take 4,000 Units by mouth daily.       Marland Kitchen aspirin-acetaminophen-caffeine (EXCEDRIN MIGRAINE) 250-250-65 MG per tablet Take 1 tablet by mouth as needed.      . citalopram (CELEXA) 10 MG tablet Take 1 tablet (10 mg total) by mouth daily.  30 tablet  5  . fexofenadine (ALLEGRA) 30 MG tablet Take 30 mg by mouth as needed.      Marland Kitchen LORazepam (ATIVAN) 0.5 MG tablet Take 1 tablet (0.5 mg total) by mouth at bedtime as needed for sleep.  30 tablet  0  . Naproxen Sodium (ALEVE PO) Take by mouth.       No current facility-administered medications for this visit.   Facility-Administered Medications Ordered in Other Visits  Medication Dose Route Frequency Provider Last Rate Last Dose  . sodium chloride 0.9 % injection 10 mL  10 mL Intracatheter PRN Pierce Crane, MD   10 mL at 12/25/11 1430    OBJECTIVE: Middle-aged white woman  in no acute distress  Vitals Filed Vitals:   05/03/13 0938  BP: 123/76  Pulse: 83  Temp: 98.1 F (36.7 C)  Resp: 18  Body mass index is 31.79 kg/(m^2). ECOG: 0 Filed Weights   05/03/13 0938  Weight: 185 lb 4.8 oz (84.052 kg)    Patient is well oriented with appropriate affect.  Remainder of physical exam was  deferred today.    LAB RESULTS:   Lab Results  Component Value Date   WBC 5.5 03/21/2013   NEUTROABS 3.0 03/21/2013   HGB 13.8 03/21/2013   HCT 40.7 03/21/2013   MCV 91.5 03/21/2013   PLT 253 03/21/2013      Chemistry      Component Value Date/Time   NA 141 03/21/2013 0920   NA 141 12/18/2011 1205   K 4.1 03/21/2013 0920   K 3.6 12/18/2011 1205   CL 105 09/06/2012 0852   CL 106 12/18/2011 1205   CO2 22 03/21/2013 0920   CO2 27 12/18/2011 1205   BUN 15.2 03/21/2013 0920   BUN 15 12/18/2011 1205   CREATININE 0.8 03/21/2013 0920   CREATININE 0.83 12/18/2011 1205      Component Value Date/Time   CALCIUM 9.5 03/21/2013 0920   CALCIUM 9.2 12/18/2011 1205   ALKPHOS 123 03/21/2013 0920   ALKPHOS 83 12/18/2011 1205   AST 22 03/21/2013 0920   AST 23 12/18/2011 1205   ALT 18 03/21/2013 0920   ALT 31 12/18/2011 1205   BILITOT 0.40 03/21/2013 0920   BILITOT 0.5 12/18/2011 1205      STUDIES:   Most recent bone density 03/11/2012 showed osteopenia with a T score of -1.2.  Bilateral mammogram on 10/12/2012 was unremarkable.     ASSESSMENT: 63 y.o. [place] woman  (1) status post right lumpectomy and sentinel lymph node sampling 08/21/2011 for a pT2 pN0, stage IIA invasive ductal carcinoma, grade 3, estrogen receptor 100% and progesterone receptor 59% positive, with an MIB-1 of 66% and HER-2 amplification by CISH with a ratio of 2.82  (2) treated adjuvantly with carboplatin, docetaxel and trastuzumab x6 completed 12/18/2011  (3) completed adjuvant radiation 02/24/2012  (4) completed 1 year of trastuzumab 09/06/2012; echo 08/23/2012 shows a well-preserved ejection fraction  (5) started anastrozole September of 2013, held since December 2014 due to multiple side effects.  (6)  DEXA scan 03/11/2012 showed minimal osteopenia (T score -1.2)  PLAN:  Over  half of our 20 minute appointment today was spent reviewing Rachael Weaver's concerns the anastrozole, counseling her with regards to  treatment options, and coordinating care.  We discussed the possibility of trying a different aromatase inhibitor such as exemestane. Although she feels like she had a "bad experience" with the generic anastrozole, she really doesn't want to change drugs altogether.  She feels strongly that she had fewer side effects when she first started taking the drug, when she was taking the brand name Arimidex rather than the generic. She tells me she has contacted the pharmaceutical company and would be able to get the brand name medication for approximately $20 per month.  She would like to try that route before she changes to a different drug.  Certainly have no problem with this, and I have given her a written prescription for Arimidex, 1 mg by mouth daily, dispense #30 with refills times one year.  She will contact the pharmaceutical company and find out exactly what she needs to get the brand name medication, and if she needs more than and prescription, she will contact us and let us know.  In addition we discussed trying a low dose of Celexa, 10 mg daily, to see if this helps with her mild depression. I recommended that she try taking the medication at night in case it makes her slightly sleepy. She understands that it takes 4-6 weeks to see an optimal response, but if she sees no difference in how she feels after approximately one month, she will contact us and we will consider increasing the dose to 20 mg daily. I plan on seeing her back in 6-8 weeks for brief followup on both of these issues.  If she continues on the anastrozole for 2 years, she would have the option of switching to tamoxifen in late 2015. She is slightly past due for her bone density, last obtained in November 2013. We will discuss this at her next appointment and get that scheduled. Certainly, those results might help [place] make that decision between continuing an aromatase inhibitor or switching to tamoxifen if her osteopenia has worsened.     Rachael Weaver is also due for her next annual mammogram in June and I have requested that that be scheduled for her. She is already scheduled to return for routine labs and physical exam with Dr. Jana Hakim in July.  Rachael Weaver voices understanding with the above and is very much in favor with our plan today. She return in approximately 2 months, but knows to call prior that time with any changes or problems.   Justinian Miano, PA-C   05/03/2013 9:11 PM

## 2013-05-03 NOTE — Telephone Encounter (Signed)
appts made and printed...td 

## 2013-05-04 ENCOUNTER — Telehealth: Payer: Self-pay | Admitting: *Deleted

## 2013-05-04 NOTE — Telephone Encounter (Signed)
sw pt gv appt for mammo 10/13/13@ 10am and bone dexa for 03/13/14@ 10am. Pt is aware...td

## 2013-06-14 ENCOUNTER — Other Ambulatory Visit: Payer: Self-pay | Admitting: *Deleted

## 2013-06-15 ENCOUNTER — Other Ambulatory Visit: Payer: Self-pay | Admitting: *Deleted

## 2013-06-15 DIAGNOSIS — C50911 Malignant neoplasm of unspecified site of right female breast: Secondary | ICD-10-CM

## 2013-06-15 MED ORDER — ANASTROZOLE 1 MG PO TABS: 1.0000 mg | ORAL_TABLET | Freq: Every day | ORAL | Status: DC

## 2013-07-01 ENCOUNTER — Encounter: Payer: Self-pay | Admitting: Physician Assistant

## 2013-07-01 ENCOUNTER — Ambulatory Visit: Payer: Self-pay | Admitting: Physician Assistant

## 2013-07-01 NOTE — Progress Notes (Signed)
[facility] today.  Letter mailed to patient.   Micah Flesher, PA-C 07/01/2013

## 2013-07-04 ENCOUNTER — Telehealth: Payer: Self-pay | Admitting: Physician Assistant

## 2013-07-04 ENCOUNTER — Encounter: Payer: Self-pay | Admitting: Physician Assistant

## 2013-07-04 ENCOUNTER — Encounter: Payer: Self-pay | Admitting: *Deleted

## 2013-07-04 NOTE — Telephone Encounter (Signed)
Sent letter to patient from Amy Berry PA. °

## 2013-07-22 ENCOUNTER — Other Ambulatory Visit (HOSPITAL_COMMUNITY): Payer: Self-pay | Admitting: Internal Medicine

## 2013-08-30 ENCOUNTER — Other Ambulatory Visit: Payer: Self-pay | Admitting: *Deleted

## 2013-08-30 DIAGNOSIS — C50911 Malignant neoplasm of unspecified site of right female breast: Secondary | ICD-10-CM

## 2013-08-30 MED ORDER — ANASTROZOLE 1 MG PO TABS: 1.0000 mg | ORAL_TABLET | Freq: Every day | ORAL | Status: DC

## 2013-09-01 ENCOUNTER — Other Ambulatory Visit: Payer: Self-pay | Admitting: *Deleted

## 2013-09-01 DIAGNOSIS — C50911 Malignant neoplasm of unspecified site of right female breast: Secondary | ICD-10-CM

## 2013-09-01 MED ORDER — ANASTROZOLE 1 MG PO TABS: 1.0000 mg | ORAL_TABLET | Freq: Every day | ORAL | Status: DC

## 2013-10-03 ENCOUNTER — Telehealth: Payer: Self-pay | Admitting: *Deleted

## 2013-10-03 NOTE — Telephone Encounter (Signed)
Left vm for pt to return call to r/s f/u appt with Dr. Jana Hakim on 10/31/13.

## 2013-10-04 ENCOUNTER — Telehealth: Payer: Self-pay | Admitting: *Deleted

## 2013-10-04 NOTE — Telephone Encounter (Signed)
Confirmed new appt date and time with Rachael Weaver on 11/01/13, lab at 2:45PM and Rachael Weaver at 3:15PM. Pt denies further needs.

## 2013-10-13 ENCOUNTER — Ambulatory Visit
Admission: RE | Admit: 2013-10-13 | Discharge: 2013-10-13 | Disposition: A | Discharge status: Home / Self Care | Payer: Self-pay | Source: Ambulatory Visit | Attending: Physician Assistant | Admitting: Physician Assistant

## 2013-10-13 DIAGNOSIS — Z853 Personal history of malignant neoplasm of breast: Secondary | ICD-10-CM

## 2013-10-24 ENCOUNTER — Other Ambulatory Visit: Payer: Self-pay

## 2013-10-27 ENCOUNTER — Telehealth: Payer: Self-pay | Admitting: *Deleted

## 2013-10-27 NOTE — Telephone Encounter (Signed)
Called pt to r/s f/u with Dr. Arlys John. Pt relate she will only see Mendel Ryder or Dr. Jana Hakim.  Schedule pt to see Mendel Ryder on 7/10 at 1015/1045. Pt denies further needs.

## 2013-10-31 ENCOUNTER — Ambulatory Visit: Payer: Self-pay | Admitting: Oncology

## 2013-11-01 ENCOUNTER — Ambulatory Visit: Payer: Self-pay | Admitting: Adult Health

## 2013-11-01 ENCOUNTER — Ambulatory Visit: Payer: Self-pay

## 2013-11-01 ENCOUNTER — Other Ambulatory Visit: Payer: Self-pay

## 2013-11-03 ENCOUNTER — Other Ambulatory Visit: Payer: Self-pay | Admitting: *Deleted

## 2013-11-03 DIAGNOSIS — C50411 Malignant neoplasm of upper-outer quadrant of right female breast: Secondary | ICD-10-CM

## 2013-11-04 ENCOUNTER — Encounter: Payer: Self-pay | Admitting: Adult Health

## 2013-11-04 ENCOUNTER — Other Ambulatory Visit (HOSPITAL_BASED_OUTPATIENT_CLINIC_OR_DEPARTMENT_OTHER): Payer: Self-pay

## 2013-11-04 ENCOUNTER — Ambulatory Visit (HOSPITAL_BASED_OUTPATIENT_CLINIC_OR_DEPARTMENT_OTHER): Payer: Self-pay | Admitting: Adult Health

## 2013-11-04 VITALS — BP 145/85 | HR 90 | Temp 98.9°F | Resp 18 | Ht 64.0 in | Wt 183.5 lb

## 2013-11-04 DIAGNOSIS — C50911 Malignant neoplasm of unspecified site of right female breast: Secondary | ICD-10-CM

## 2013-11-04 DIAGNOSIS — F329 Major depressive disorder, single episode, unspecified: Secondary | ICD-10-CM

## 2013-11-04 DIAGNOSIS — M899 Disorder of bone, unspecified: Secondary | ICD-10-CM

## 2013-11-04 DIAGNOSIS — C50411 Malignant neoplasm of upper-outer quadrant of right female breast: Secondary | ICD-10-CM

## 2013-11-04 DIAGNOSIS — F3289 Other specified depressive episodes: Secondary | ICD-10-CM

## 2013-11-04 DIAGNOSIS — Z139 Encounter for screening, unspecified: Secondary | ICD-10-CM

## 2013-11-04 DIAGNOSIS — F32A Depression, unspecified: Secondary | ICD-10-CM

## 2013-11-04 DIAGNOSIS — C50919 Malignant neoplasm of unspecified site of unspecified female breast: Secondary | ICD-10-CM

## 2013-11-04 DIAGNOSIS — Z170 Estrogen receptor positive status [ER+]: Secondary | ICD-10-CM

## 2013-11-04 DIAGNOSIS — I10 Essential (primary) hypertension: Secondary | ICD-10-CM

## 2013-11-04 DIAGNOSIS — M949 Disorder of cartilage, unspecified: Secondary | ICD-10-CM

## 2013-11-04 LAB — CBC WITH DIFFERENTIAL/PLATELET
BASO%: 0.6 % (ref 0.0–2.0)
Basophils Absolute: 0 10*3/uL (ref 0.0–0.1)
EOS ABS: 0.1 10*3/uL (ref 0.0–0.5)
EOS%: 1.1 % (ref 0.0–7.0)
HCT: 41.6 % (ref 34.8–46.6)
HGB: 14.1 g/dL (ref 11.6–15.9)
LYMPH%: 37.4 % (ref 14.0–49.7)
MCH: 30.9 pg (ref 25.1–34.0)
MCHC: 33.8 g/dL (ref 31.5–36.0)
MCV: 91.5 fL (ref 79.5–101.0)
MONO#: 0.5 10*3/uL (ref 0.1–0.9)
MONO%: 9 % (ref 0.0–14.0)
NEUT%: 51.9 % (ref 38.4–76.8)
NEUTROS ABS: 3.1 10*3/uL (ref 1.5–6.5)
PLATELETS: 259 10*3/uL (ref 145–400)
RBC: 4.55 10*6/uL (ref 3.70–5.45)
RDW: 12.9 % (ref 11.2–14.5)
WBC: 6 10*3/uL (ref 3.9–10.3)
lymph#: 2.2 10*3/uL (ref 0.9–3.3)

## 2013-11-04 LAB — COMPREHENSIVE METABOLIC PANEL (CC13)
ALK PHOS: 124 U/L (ref 40–150)
ALT: 19 U/L (ref 0–55)
AST: 20 U/L (ref 5–34)
Albumin: 3.9 g/dL (ref 3.5–5.0)
Anion Gap: 11 mEq/L (ref 3–11)
BILIRUBIN TOTAL: 0.52 mg/dL (ref 0.20–1.20)
BUN: 15.1 mg/dL (ref 7.0–26.0)
CO2: 22 mEq/L (ref 22–29)
Calcium: 9.5 mg/dL (ref 8.4–10.4)
Chloride: 107 mEq/L (ref 98–109)
Creatinine: 0.8 mg/dL (ref 0.6–1.1)
GLUCOSE: 119 mg/dL (ref 70–140)
Potassium: 3.9 mEq/L (ref 3.5–5.1)
Sodium: 139 mEq/L (ref 136–145)
TOTAL PROTEIN: 7.2 g/dL (ref 6.4–8.3)

## 2013-11-04 MED ORDER — LOSARTAN POTASSIUM 50 MG PO TABS: 50.0000 mg | ORAL_TABLET | Freq: Every day | ORAL | Status: DC

## 2013-11-04 MED ORDER — ANASTROZOLE 1 MG PO TABS: 1.0000 mg | ORAL_TABLET | Freq: Every day | ORAL | Status: DC

## 2013-11-04 MED ORDER — CITALOPRAM HYDROBROMIDE 10 MG PO TABS: 10.0000 mg | ORAL_TABLET | Freq: Every day | ORAL | Status: DC

## 2013-11-04 NOTE — Patient Instructions (Signed)
You are doing well.  You have no sign of recurrence.  Continue taking Arimidex. We will see you back in 6 months.  I recommend a healthy diet, exercise, and self breast exams.  Please call us if you have any questions or concerns.    Breast Self-Awareness Practicing breast self-awareness may pick up problems early, prevent significant medical complications, and possibly save your life. By practicing breast self-awareness, you can become familiar with how your breasts look and feel and if your breasts are changing. This allows you to notice changes early. It can also offer you some reassurance that your breast health is good. One way to learn what is normal for your breasts and whether your breasts are changing is to do a breast self-exam. If you find a lump or something that was not present in the past, it is best to contact your caregiver right away. Other findings that should be evaluated by your caregiver include nipple discharge, especially if it is bloody; skin changes or reddening; areas where the skin seems to be pulled in (retracted); or new lumps and bumps. Breast pain is seldom associated with cancer (malignancy), but should also be evaluated by a caregiver. HOW TO PERFORM A BREAST SELF-EXAM The best time to examine your breasts is 5-7 days after your menstrual period is over. During menstruation, the breasts are lumpier, and it may be more difficult to pick up changes. If you do not menstruate, have reached menopause, or had your uterus removed (hysterectomy), you should examine your breasts at regular intervals, such as monthly. If you are breastfeeding, examine your breasts after a feeding or after using a breast pump. Breast implants do not decrease the risk for lumps or tumors, so continue to perform breast self-exams as recommended. Talk to your caregiver about how to determine the difference between the implant and breast tissue. Also, talk about the amount of pressure you should use during  the exam. Over time, you will become more familiar with the variations of your breasts and more comfortable with the exam. A breast self-exam requires you to remove all your clothes above the waist. 1. Look at your breasts and nipples. Stand in front of a mirror in a room with good lighting. With your hands on your hips, push your hands firmly downward. Look for a difference in shape, contour, and size from one breast to the other (asymmetry). Asymmetry includes puckers, dips, or bumps. Also, look for skin changes, such as reddened or scaly areas on the breasts. Look for nipple changes, such as discharge, dimpling, repositioning, or redness. 2. Carefully feel your breasts. This is best done either in the shower or tub while using soapy water or when flat on your back. Place the arm (on the side of the breast you are examining) above your head. Use the pads (not the fingertips) of your three middle fingers on your opposite hand to feel your breasts. Start in the underarm area and use  inch (2 cm) overlapping circles to feel your breast. Use 3 different levels of pressure (light, medium, and firm pressure) at each circle before moving to the next circle. The light pressure is needed to feel the tissue closest to the skin. The medium pressure will help to feel breast tissue a little deeper, while the firm pressure is needed to feel the tissue close to the ribs. Continue the overlapping circles, moving downward over the breast until you feel your ribs below your breast. Then, move one finger-width towards the  center of the body. Continue to use the  inch (2 cm) overlapping circles to feel your breast as you move slowly up toward the collar bone (clavicle) near the base of the neck. Continue the up and down exam using all 3 pressures until you reach the middle of the chest. Do this with each breast, carefully feeling for lumps or changes. 3.  Keep a written record with breast changes or normal findings for each  breast. By writing this information down, you do not need to depend only on memory for size, tenderness, or location. Write down where you are in your menstrual cycle, if you are still menstruating. Breast tissue can have some lumps or thick tissue. However, see your caregiver if you find anything that concerns you.  SEEK MEDICAL CARE IF:  You see a change in shape, contour, or size of your breasts or nipples.   You see skin changes, such as reddened or scaly areas on the breasts or nipples.   You have an unusual discharge from your nipples.   You feel a new lump or unusually thick areas.  Document Released: 04/14/2005 Document Revised: 03/31/2012 Document Reviewed: 07/30/2011 [employer] Patient Information 2015 [place], [place]. This information is not intended to replace advice given to you by your health care provider. Make sure you discuss any questions you have with your health care provider.

## 2013-11-04 NOTE — Progress Notes (Signed)
ID: Rachael Weaver OB: Jul 25, 1950  MR#: [id number]  TGG#:[id number]  PCP: No PCP Per Patient GYN:   SU: [facility] OTHER MD: Rachael Weaver  CHIEF COMPLAINT:  Right Breast Cancer  HISTORY OF PRESENT ILLNESS:  From Rachael Weaver's intake note 05/03/2012:  "Patient initially presented with increasing tenderness in her breasts. The pain seemed to be worse on the right which prompted a mammogram. The mammogram showed a 2.9 x 2.8 x 1.5 cm mass. 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 She was also noted to have an enlarged right axillary lymph node measuring 1.5 cm. She met with Rachael. Eston Weaver who did recommend  neoadjuvant chemotherapy. However patient opted for a lumpectomy up front.  and on 08/21/2011 patient had a right breast lumpectomy with sentinel lymph node biopsy. The final pathology revealed a grade 3 invasive ductal carcinoma measuring 3.1 cm with associated high-grade ductal carcinoma in situ all margins were negative. Three sentinel nodes were negative for metastatic disease. The tumor was estrogen receptor +100% per just her receptor-positive 59% HER-2/neu was amplified with a ratio of 2.8 to Ki-67 was 66%."  Her subsequent history is as detailed below.  INTERVAL HISTORY:  Rachael Weaver is here today for follow up of her right breast cancer.  She was seen by Rachael Weaver in clinic back in January, 2015.  She had been off arimidex due to fatigue.  She was going to start back on brand name arimidex, however she instead restarted the generic she had on hand and has been doing well.  She was also started on Celexa by Rachael Weaver for depression and she is tolerating that medication well.  She does have occasional muscle aches, hot flashes, but these are tolerable.  She otherwise denies fevers, chills, nausea, vomiting, constipation,  diarrhea, numbness, skin changes, new pain, shortness of breath, dysuria or any further concerns.   REVIEW OF SYSTEMS: A 10 point review of systems was conducted and is otherwise negative except for what is noted above.      PAST MEDICAL HISTORY: Past Medical History  Diagnosis Date  . Hypertension     borderline- no meds  . Headache(784.0)     USES OTC MEDS  . GERD (gastroesophageal reflux disease)   . Breast cancer     right breast  . Maintenance chemotherapy following disease   . Allergy     seasonal /rag weed  . Arthritis     hands feet  . Anxiety   . Cataract     no surgery yet  . Diabetes mellitus     when pregnant only 18 years ago,resolved  . Cancer   . Depression 05/03/2013    PAST SURGICAL HISTORY: Past Surgical History  Procedure Laterality Date  . Cesarean section    . Portacath placement  08/21/2011    Procedure: INSERTION PORT-A-CATH;  Surgeon: Rachael Lasso, MD;  Location: [facility];  Service: General;  Laterality: Left;  . Breast lumpectomy  08/21/11    right  . Port-a-cath removal N/A 04/13/2013    Procedure: MINOR REMOVAL PORT-A-CATH;  Surgeon: Rachael Lasso, MD;  Location: [facility];  Service: General;  Laterality: N/A;    FAMILY HISTORY Family History  Problem Relation Age of Onset  . Diabetes Paternal Grandfather   .  Heart disease Paternal Grandfather   . Breast cancer Maternal Grandmother   . Cancer Maternal Grandmother     breast  . Hypertension Father   . Stroke Father   . Breast cancer Maternal Aunt   . Cancer Maternal Aunt     breast  . Cancer Cousin 40    cervical or ovarian   the patient's father died at the age of 52 following multiple strokes in the setting of untreated hypertension. The patient's mother died at the age of 21 with congestive heart failure. The patient's mother's mother was diagnosed with breast cancer in her 67s and one of the patient's mother's 2 sisters, one of the  patient's maternal aunts, was diagnosed with breast cancer in her 82s. Rachael Weaver herself has 5 brothers, no sisters. There is no other breast or ovarian cancer in the family to the patient's knowledge.  GYNECOLOGIC HISTORY:   (Updated 03/28/2013) Menarche age 21, first live birth age 33. The patient is GX P1. She went through menopause age 23. She did not take hormones.  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 She is divorced. At home is her adopted daughter Rachael Weaver (originally from [place]) who will soon be 51.  Her son Rachael Weaver is currently living in [place].   ADVANCED DIRECTIVES: Not in place   HEALTH MAINTENANCE: (Updated 03/28/2013) History  Substance Use Topics  . Smoking status: Former Smoker -- 1.00 packs/day    Types: Cigarettes    Quit date: 04/29/1971  . Smokeless tobacco: Never Used  . Alcohol Use: 0.0 oz/week    0-1 Glasses of wine per week     Comment: rarely social glass wine     Colonoscopy: Due  PAP: March 2013  Bone density: November 2013, osteopenia  Lipid panel: Not on file  Mammogram: 09/2013   No Known Allergies  Current Outpatient Prescriptions  Medication Sig Dispense Refill  . anastrozole (ARIMIDEX) 1 MG tablet Take 1 tablet (1 mg total) by mouth daily.  30 tablet  12  . aspirin 81 MG tablet Take 81 mg by mouth daily.      Marland Kitchen b complex vitamins tablet Take 1 tablet by mouth daily.      . citalopram (CELEXA) 10 MG tablet Take 1 tablet (10 mg total) by mouth daily.  30 tablet  12  . cyanocobalamin 100 MCG tablet Take 100 mcg by mouth daily.      Marland Kitchen losartan (COZAAR) 50 MG tablet Take 1 tablet (50 mg total) by mouth daily.  30 tablet  0  . Naproxen Sodium (ALEVE PO) Take by mouth.      Marland Kitchen VITAMIN D, CHOLECALCIFEROL, PO Take 4,000 Units by mouth daily.       Marland Kitchen aspirin-acetaminophen-caffeine (EXCEDRIN MIGRAINE) 250-250-65 MG per tablet Take 1 tablet by mouth as needed.      . fexofenadine (ALLEGRA) 30 MG tablet Take 30 mg by mouth  as needed.      Marland Kitchen LORazepam (ATIVAN) 0.5 MG tablet Take 1 tablet (0.5 mg total) by mouth at bedtime as needed for sleep.  30 tablet  0   No current facility-administered medications for this visit.   Facility-Administered Medications Ordered in Other Visits  Medication Dose Route Frequency Provider Last Rate Last Dose  . sodium chloride 0.9 % injection 10 mL  10 mL Intracatheter PRN Eston Esters, MD   10 mL at 12/25/11 1430    OBJECTIVE: Middle-aged white woman  in no acute distress  Vitals Filed  Vitals:   11/04/13 1026  BP: 145/85  Pulse: 90  Temp: 98.9 F (37.2 C)  Resp: 18  Body mass index is 31.48 kg/(m^2). ECOG: 0 Filed Weights   11/04/13 1026  Weight: 183 lb 8 oz (83.235 kg)    Patient is well oriented with appropriate affect.  Remainder of physical exam was deferred today.    LAB RESULTS:   Lab Results  Component Value Date   WBC 6.0 11/04/2013   NEUTROABS 3.1 11/04/2013   HGB 14.1 11/04/2013   HCT 41.6 11/04/2013   MCV 91.5 11/04/2013   PLT 259 11/04/2013      Chemistry      Component Value Date/Time   NA 139 11/04/2013 1014   NA 141 12/18/2011 1205   K 3.9 11/04/2013 1014   K 3.6 12/18/2011 1205   CL 105 09/06/2012 0852   CL 106 12/18/2011 1205   CO2 22 11/04/2013 1014   CO2 27 12/18/2011 1205   BUN 15.1 11/04/2013 1014   BUN 15 12/18/2011 1205   CREATININE 0.8 11/04/2013 1014   CREATININE 0.83 12/18/2011 1205      Component Value Date/Time   CALCIUM 9.5 11/04/2013 1014   CALCIUM 9.2 12/18/2011 1205   ALKPHOS 124 11/04/2013 1014   ALKPHOS 83 12/18/2011 1205   AST 20 11/04/2013 1014   AST 23 12/18/2011 1205   ALT 19 11/04/2013 1014   ALT 31 12/18/2011 1205   BILITOT 0.52 11/04/2013 1014   BILITOT 0.5 12/18/2011 1205      STUDIES:   Most recent bone density 03/11/2012 showed osteopenia with a T score of -1.2.  Bilateral mammogram on 10/12/2012 was unremarkable.     ASSESSMENT: 63 y.o. [place] woman  (1) status post right lumpectomy and sentinel lymph  node sampling 08/21/2011 for a pT2 pN0, stage IIA invasive ductal carcinoma, grade 3, estrogen receptor 100% and progesterone receptor 59% positive, with an MIB-1 of 66% and HER-2 amplification by CISH with a ratio of 2.82  (2) treated adjuvantly with carboplatin, docetaxel and trastuzumab x6 completed 12/18/2011  (3) completed adjuvant radiation 02/24/2012  (4) completed 1 year of trastuzumab 09/06/2012; echo 08/23/2012 shows a well-preserved ejection fraction  (5) started anastrozole September of 2013, held since December 2014 due to multiple side effects.  (6)  DEXA scan 03/11/2012 showed minimal osteopenia (T score -1.2)  PLAN:   Rachael Weaver is doing well today.  She is taking Arimidex daily and tolerating it well. She is also taking Celexa that was started back in January.  She is taking $RemoveB'10mg'[id number]$  daily and tolerating it well.    We reviewed her health maintenance above and it is up to date with the exception of a colonoscopy that is due.  We discussed survivorship in detail.  I recommended healthy diet, exercise, and monthly self breast exams.  I referred her to GI today for evaluation for screening colonoscopy.    I refilled Rachael Weaver's Arimidex and Celexa today x 1 year.  I also gave her a one time order of Losartan until she can f/u with her PCP to get them to prescribe this.    Rachael Weaver will return in 6 months for labs and evaluation.    I spent 25 minutes counseling the patient face to face.  The total time spent in the appointment was 30 minutes.  Rachael Weaver, [place] [phone] 11/06/2013 9:58 AM

## 2013-11-09 ENCOUNTER — Telehealth: Payer: Self-pay | Admitting: Oncology

## 2013-11-09 NOTE — Telephone Encounter (Signed)
per pof to sch pt appt-cld and left message to adv of time & date-will mail copy of sch

## 2013-11-09 NOTE — Telephone Encounter (Signed)
will call Rachael Weaver office GI for appt and call pt with appt time & date-referral

## 2013-11-15 ENCOUNTER — Telehealth: Payer: Self-pay | Admitting: Oncology

## 2013-11-15 NOTE — Telephone Encounter (Signed)
per pof to sch GI appt w/Dr Starlk-cld to sch & office stated cannot sch because pt needs a referral from PCP because pt has [place] and will not get pd if referral is not authorized from [employer] to [place]  and relayed message-states she will ahndle from here

## 2014-01-21 ENCOUNTER — Ambulatory Visit (INDEPENDENT_AMBULATORY_CARE_PROVIDER_SITE_OTHER): Payer: Self-pay | Admitting: Family Medicine

## 2014-01-21 VITALS — BP 140/94 | HR 68 | Temp 98.4°F | Resp 18 | Ht 62.5 in | Wt 183.0 lb

## 2014-01-21 DIAGNOSIS — I10 Essential (primary) hypertension: Secondary | ICD-10-CM

## 2014-01-21 MED ORDER — LOSARTAN POTASSIUM 50 MG PO TABS: 50.0000 mg | ORAL_TABLET | Freq: Every day | ORAL | Status: DC

## 2014-01-21 NOTE — Progress Notes (Signed)
Subjective:    Patient ID: Rachael Weaver, female    DOB: [birth year], 63 y.o.   MRN: [id number]  Hypertension Associated symptoms include headaches and palpitations. Pertinent negatives include no chest pain.   Chief Complaint  Patient presents with  . Hypertension    neds med refills started on meds by Cardiology, now they want PCP to take over   This chart was scribed for Delman Cheadle, MD by Thea Alken, ED Scribe. This patient was seen in room 10 and the patient's care was started at 2:15 PM.  HPI Comments: Rachael Weaver is a 63 y.o. female who presents to the [facility] for a BP medication refill regarding losartan. Pt reports she was prescribed losartan through oncologist but they now want her to f/u with PCP. Pt has not taken medication in a couple of days and now has a HA. Pt also reports intermittent palpation of her heart racing. Pt reports she checks BP once every six months but is usually in the 120's -140's. Pt denies side effect with losartan. Pt denies heart failure. Pt denies CP, leg swelling, dizziness, cough.   Pt has hx of breast cancer. Has been cancer-free x 3 yrs now.   Past Medical History  Diagnosis Date  . Hypertension     borderline- no meds  . Headache(784.0)     USES OTC MEDS  . GERD (gastroesophageal reflux disease)   . Breast cancer     right breast  . Maintenance chemotherapy following disease   . Allergy     seasonal /rag weed  . Arthritis     hands feet  . Anxiety   . Cataract     no surgery yet  . Diabetes mellitus     when pregnant only 18 years ago,resolved  . Cancer   . Depression 05/03/2013   Past Surgical History  Procedure Laterality Date  . Cesarean section    . Portacath placement  08/21/2011    Procedure: INSERTION PORT-A-CATH;  Surgeon: Haywood Lasso, MD;  Location: [facility];  Service: General;  Laterality: Left;  . Breast lumpectomy  08/21/11    right  . Port-a-cath removal N/A 04/13/2013   Procedure: MINOR REMOVAL PORT-A-CATH;  Surgeon: Haywood Lasso, MD;  Location: [facility];  Service: General;  Laterality: N/A;   Prior to Admission medications   Medication Sig Start Date End Date Taking? Authorizing Provider  anastrozole (ARIMIDEX) 1 MG tablet Take 1 tablet (1 mg total) by mouth daily. 11/04/13  Yes Minette Headland, NP  aspirin 81 MG tablet Take 81 mg by mouth daily.   Yes Historical Provider, MD  aspirin-acetaminophen-caffeine (EXCEDRIN MIGRAINE) [phone] MG per tablet Take 1 tablet by mouth as needed.   Yes Historical Provider, MD  b complex vitamins tablet Take 1 tablet by mouth daily.   Yes Historical Provider, MD  citalopram (CELEXA) 10 MG tablet Take 1 tablet (10 mg total) by mouth daily. 11/04/13  Yes Minette Headland, NP  cyanocobalamin 100 MCG tablet Take 100 mcg by mouth daily.   Yes Historical Provider, MD  fexofenadine (ALLEGRA) 30 MG tablet Take 30 mg by mouth as needed.   Yes Historical Provider, MD  losartan (COZAAR) 50 MG tablet Take 1 tablet (50 mg total) by mouth daily. 11/04/13  Yes Minette Headland, NP  Naproxen Sodium (ALEVE PO) Take by mouth.   Yes Historical Provider, MD  VITAMIN D, CHOLECALCIFEROL, PO Take 4,000 Units  by mouth daily.    Yes Historical Provider, MD  LORazepam (ATIVAN) 0.5 MG tablet Take 1 tablet (0.5 mg total) by mouth at bedtime as needed for sleep. 03/28/13   Amy Milda Smart, PA-C   Review of Systems  Respiratory: Negative for cough.   Cardiovascular: Positive for palpitations. Negative for chest pain and leg swelling.  Neurological: Positive for headaches. Negative for dizziness.   Objective:   Physical Exam  Nursing note and vitals reviewed. Constitutional: She is oriented to person, place, and time. She appears well-developed and well-nourished. No distress.  HENT:  Head: Normocephalic and atraumatic.  Eyes: Conjunctivae and EOM are normal.  Neck: Neck supple. No thyromegaly present.  Cardiovascular:  Normal rate, regular rhythm, S1 normal, S2 normal and normal heart sounds.  Exam reveals no gallop and no friction rub.   No murmur heard. Pulmonary/Chest: Effort normal and breath sounds normal. No respiratory distress. She has no wheezes. She exhibits no tenderness.  Musculoskeletal: Normal range of motion.  Neurological: She is alert and oriented to person, place, and time.  Skin: Skin is warm and dry.  Psychiatric: She has a normal mood and affect. Her behavior is normal.  BP 140/94  Pulse 68  Temp(Src) 98.4 F (36.9 C)  Resp 18  Ht 5' 2.5" (1.588 m)  Wt 183 lb (83.008 kg)  BMI 32.92 kg/m2  SpO2 98%  Assessment & Plan:    1. Essential hypertension    Meds ordered this encounter  Medications  . losartan (COZAAR) 50 MG tablet    Sig: Take 1 tablet (50 mg total) by mouth daily.    Dispense:  90 tablet    Refill:  3   Pt has been advised to check BP outside of the office.  Restart losartan and rec f/u prior to running out for bmp at f/u.  I personally performed the services described in this documentation, which was scribed in my presence. The recorded information has been reviewed and considered, and addended by me as needed.  Delman Cheadle, MD MPH

## 2014-01-21 NOTE — Patient Instructions (Addendum)
DASH Eating Plan DASH stands for "Dietary Approaches to Stop Hypertension." The DASH eating plan is a healthy eating plan that has been shown to reduce high blood pressure (hypertension). Additional health benefits may include reducing the risk of type 2 diabetes mellitus, heart disease, and stroke. The DASH eating plan may also help with weight loss. WHAT DO I NEED TO KNOW ABOUT THE DASH EATING PLAN? For the DASH eating plan, you will follow these general guidelines:  Choose foods with a percent daily value for sodium of less than 5% (as listed on the food label).  Use salt-free seasonings or herbs instead of table salt or sea salt.  Check with your health care provider or pharmacist before using salt substitutes.  Eat lower-sodium products, often labeled as "lower sodium" or "no salt added."  Eat fresh foods.  Eat more vegetables, fruits, and low-fat dairy products.  Choose whole grains. Look for the word "whole" as the first word in the ingredient list.  Choose fish and skinless chicken or [place] more often than red meat. Limit fish, poultry, and meat to 6 oz (170 g) each day.  Limit sweets, desserts, sugars, and sugary drinks.  Choose heart-healthy fats.  Limit cheese to 1 oz (28 g) per day.  Eat more home-cooked food and less restaurant, buffet, and fast food.  Limit fried foods.  Cook foods using methods other than frying.  Limit canned vegetables. If you do use them, rinse them well to decrease the sodium.  When eating at a restaurant, ask that your food be prepared with less salt, or no salt if possible. WHAT FOODS CAN I EAT? Seek help from a dietitian for individual calorie needs. Grains Whole grain or whole wheat bread. Brown rice. Whole grain or whole wheat pasta. Quinoa, bulgur, and whole grain cereals. Low-sodium cereals. Corn or whole wheat flour tortillas. Whole grain cornbread. Whole grain crackers. Low-sodium crackers. Vegetables Fresh or frozen vegetables  (raw, steamed, roasted, or grilled). Low-sodium or reduced-sodium tomato and vegetable juices. Low-sodium or reduced-sodium tomato sauce and paste. Low-sodium or reduced-sodium canned vegetables.  Fruits All fresh, canned (in natural juice), or frozen fruits. Meat and Other Protein Products Ground beef (85% or leaner), grass-fed beef, or beef trimmed of fat. Skinless chicken or [place]. Ground chicken or [place]. Pork trimmed of fat. All fish and seafood. Eggs. Dried beans, peas, or lentils. Unsalted nuts and seeds. Unsalted canned beans. Dairy Low-fat dairy products, such as skim or 1% milk, 2% or reduced-fat cheeses, low-fat ricotta or cottage cheese, or plain low-fat yogurt. Low-sodium or reduced-sodium cheeses. Fats and Oils Tub margarines without trans fats. Light or reduced-fat mayonnaise and salad dressings (reduced sodium). Avocado. Safflower, olive, or canola oils. Natural peanut or almond butter. Other Unsalted popcorn and pretzels. The items listed above may not be a complete list of recommended foods or beverages. Contact your dietitian for more options. WHAT FOODS ARE NOT RECOMMENDED? Grains White bread. White pasta. White rice. Refined cornbread. Bagels and croissants. Crackers that contain trans fat. Vegetables Creamed or fried vegetables. Vegetables in a cheese sauce. Regular canned vegetables. Regular canned tomato sauce and paste. Regular tomato and vegetable juices. Fruits Dried fruits. Canned fruit in light or heavy syrup. Fruit juice. Meat and Other Protein Products Fatty cuts of meat. Ribs, chicken wings, bacon, sausage, bologna, salami, chitterlings, fatback, hot dogs, bratwurst, and packaged luncheon meats. Salted nuts and seeds. Canned beans with salt. Dairy Whole or 2% milk, cream, half-and-half, and cream cheese. Whole-fat or sweetened yogurt. Full-fat  cheeses or blue cheese. Nondairy creamers and whipped toppings. Processed cheese, cheese spreads, or cheese  curds. Condiments Onion and garlic salt, seasoned salt, table salt, and sea salt. Canned and packaged gravies. Worcestershire sauce. Tartar sauce. Barbecue sauce. Teriyaki sauce. Soy sauce, including reduced sodium. Steak sauce. Fish sauce. Oyster sauce. Cocktail sauce. Horseradish. Ketchup and mustard. Meat flavorings and tenderizers. Bouillon cubes. Hot sauce. Tabasco sauce. Marinades. Taco seasonings. Relishes. Fats and Oils Butter, stick margarine, lard, shortening, ghee, and bacon fat. Coconut, palm kernel, or palm oils. Regular salad dressings. Other Pickles and olives. Salted popcorn and pretzels. The items listed above may not be a complete list of foods and beverages to avoid. Contact your dietitian for more information. WHERE CAN I FIND MORE INFORMATION? National Heart, Lung, and Blood Institute: travelstabloid.com Document Released: 04/03/2011 Document Revised: 08/29/2013 Document Reviewed: 02/16/2013 [facility] Patient Information 2015 [place], [place]. This information is not intended to replace advice given to you by your health care provider. Make sure you discuss any questions you have with your health care provider. Managing Your High Blood Pressure Blood pressure is a measurement of how forceful your blood is pressing against the walls of the arteries. Arteries are muscular tubes within the circulatory system. Blood pressure does not stay the same. Blood pressure rises when you are active, excited, or nervous; and it lowers during sleep and relaxation. If the numbers measuring your blood pressure stay above normal most of the time, you are at risk for health problems. High blood pressure (hypertension) is a long-term (chronic) condition in which blood pressure is elevated. A blood pressure reading is recorded as two numbers, such as 120 over 80 (or 120/80). The first, higher number is called the systolic pressure. It is a measure of the pressure in your  arteries as the heart beats. The second, lower number is called the diastolic pressure. It is a measure of the pressure in your arteries as the heart relaxes between beats.  Keeping your blood pressure in a normal range is important to your overall health and prevention of health problems, such as heart disease and stroke. When your blood pressure is uncontrolled, your heart has to work harder than normal. High blood pressure is a very common condition in adults because blood pressure tends to rise with age. Men and women are equally likely to have hypertension but at different times in life. Before age 8, men are more likely to have hypertension. After 61 years of age, women are more likely to have it. Hypertension is especially common in African Americans. This condition often has no signs or symptoms. The cause of the condition is usually not known. Your caregiver can help you come up with a plan to keep your blood pressure in a normal, healthy range. BLOOD PRESSURE STAGES Blood pressure is classified into four stages: normal, prehypertension, stage 1, and stage 2. Your blood pressure reading will be used to determine what type of treatment, if any, is necessary. Appropriate treatment options are tied to these four stages:  Normal  Systolic pressure (mm Hg): below 120.  Diastolic pressure (mm Hg): below 80. Prehypertension  Systolic pressure (mm Hg): 120 to 139.  Diastolic pressure (mm Hg): 80 to 89. Stage1  Systolic pressure (mm Hg): 140 to 159.  Diastolic pressure (mm Hg): 90 to 99. Stage2  Systolic pressure (mm Hg): 160 or above.  Diastolic pressure (mm Hg): 100 or above. RISKS RELATED TO HIGH BLOOD PRESSURE Managing your blood pressure is an important responsibility. Uncontrolled  high blood pressure can lead to:  A heart attack.  A stroke.  A weakened blood vessel (aneurysm).  Heart failure.  Kidney damage.  Eye damage.  Metabolic syndrome.  Memory and concentration  problems. HOW TO MANAGE YOUR BLOOD PRESSURE Blood pressure can be managed effectively with lifestyle changes and medicines (if needed). Your caregiver will help you come up with a plan to bring your blood pressure within a normal range. Your plan should include the following: Education  Read all information provided by your caregivers about how to control blood pressure.  Educate yourself on the latest guidelines and treatment recommendations. New research is always being done to further define the risks and treatments for high blood pressure. Lifestylechanges  Control your weight.  Avoid smoking.  Stay physically active.  Reduce the amount of salt in your diet.  Reduce stress.  Control any chronic conditions, such as high cholesterol or diabetes.  Reduce your alcohol intake. Medicines  Several medicines (antihypertensive medicines) are available, if needed, to bring blood pressure within a normal range. Communication  Review all the medicines you take with your caregiver because there may be side effects or interactions.  Talk with your caregiver about your diet, exercise habits, and other lifestyle factors that may be contributing to high blood pressure.  See your caregiver regularly. Your caregiver can help you create and adjust your plan for managing high blood pressure. RECOMMENDATIONS FOR TREATMENT AND FOLLOW-UP  The following recommendations are based on current guidelines for managing high blood pressure in nonpregnant adults. Use these recommendations to identify the proper follow-up period or treatment option based on your blood pressure reading. You can discuss these options with your caregiver.  Systolic pressure of 789 to 381 or diastolic pressure of 80 to 89: Follow up with your caregiver as directed.  Systolic pressure of 017 to 510 or diastolic pressure of 90 to 100: Follow up with your caregiver within 2 months.  Systolic pressure above 258 or diastolic  pressure above 527: Follow up with your caregiver within 1 month.  Systolic pressure above 782 or diastolic pressure above 423: Consider antihypertensive therapy; follow up with your caregiver within 1 week.  Systolic pressure above 536 or diastolic pressure above 144: Begin antihypertensive therapy; follow up with your caregiver within 1 week. Document Released: 01/07/2012 Document Reviewed: 01/07/2012 [facility] Patient Information 2015 [place]. This information is not intended to replace advice given to you by your health care provider. Make sure you discuss any questions you have with your health care provider.

## 2014-03-13 ENCOUNTER — Ambulatory Visit
Admission: RE | Admit: 2014-03-13 | Discharge: 2014-03-13 | Disposition: A | Discharge status: Home / Self Care | Payer: No Typology Code available for payment source | Source: Ambulatory Visit | Attending: Physician Assistant | Admitting: Physician Assistant

## 2014-03-13 DIAGNOSIS — Z853 Personal history of malignant neoplasm of breast: Secondary | ICD-10-CM

## 2014-03-13 DIAGNOSIS — Z780 Asymptomatic menopausal state: Secondary | ICD-10-CM

## 2014-04-05 ENCOUNTER — Telehealth: Payer: Self-pay | Admitting: Oncology

## 2014-04-05 NOTE — Telephone Encounter (Signed)
S/w pt advised appt chg from 1/20 to 1/22 with HF due to md on pal. Pt verbalized understanding.

## 2014-05-17 ENCOUNTER — Other Ambulatory Visit: Payer: Self-pay

## 2014-05-17 ENCOUNTER — Ambulatory Visit: Payer: Self-pay | Admitting: Oncology

## 2014-05-18 ENCOUNTER — Other Ambulatory Visit: Payer: Self-pay | Admitting: *Deleted

## 2014-05-18 DIAGNOSIS — C50411 Malignant neoplasm of upper-outer quadrant of right female breast: Secondary | ICD-10-CM

## 2014-05-19 ENCOUNTER — Telehealth: Payer: Self-pay | Admitting: *Deleted

## 2014-05-19 ENCOUNTER — Other Ambulatory Visit: Payer: Self-pay

## 2014-05-19 ENCOUNTER — Telehealth: Payer: Self-pay | Admitting: Nurse Practitioner

## 2014-05-19 ENCOUNTER — Ambulatory Visit: Payer: Self-pay | Admitting: Nurse Practitioner

## 2014-05-19 NOTE — Telephone Encounter (Signed)
Pt will not be able to come today for appt b/c of inclement weather. Pt has asked to be rescheduled. I will r/s appt  per Christeen Douglas. Message to be forwarded to [facility].

## 2014-05-19 NOTE — Telephone Encounter (Signed)
pt r/s appt-pt aware of r/s appt time & date

## 2014-06-09 ENCOUNTER — Encounter: Payer: Self-pay | Admitting: Nurse Practitioner

## 2014-06-09 ENCOUNTER — Telehealth: Payer: Self-pay | Admitting: Nurse Practitioner

## 2014-06-09 ENCOUNTER — Other Ambulatory Visit (HOSPITAL_BASED_OUTPATIENT_CLINIC_OR_DEPARTMENT_OTHER): Payer: Self-pay

## 2014-06-09 ENCOUNTER — Ambulatory Visit (HOSPITAL_BASED_OUTPATIENT_CLINIC_OR_DEPARTMENT_OTHER): Payer: Self-pay | Admitting: Nurse Practitioner

## 2014-06-09 VITALS — BP 142/78 | HR 80 | Temp 98.8°F | Resp 18 | Ht 62.5 in | Wt 184.5 lb

## 2014-06-09 DIAGNOSIS — C50411 Malignant neoplasm of upper-outer quadrant of right female breast: Secondary | ICD-10-CM

## 2014-06-09 DIAGNOSIS — G629 Polyneuropathy, unspecified: Secondary | ICD-10-CM | POA: Insufficient documentation

## 2014-06-09 DIAGNOSIS — Z170 Estrogen receptor positive status [ER+]: Secondary | ICD-10-CM

## 2014-06-09 DIAGNOSIS — M810 Age-related osteoporosis without current pathological fracture: Secondary | ICD-10-CM

## 2014-06-09 DIAGNOSIS — G620 Drug-induced polyneuropathy: Secondary | ICD-10-CM

## 2014-06-09 DIAGNOSIS — F329 Major depressive disorder, single episode, unspecified: Secondary | ICD-10-CM

## 2014-06-09 DIAGNOSIS — F32A Depression, unspecified: Secondary | ICD-10-CM

## 2014-06-09 DIAGNOSIS — C50811 Malignant neoplasm of overlapping sites of right female breast: Secondary | ICD-10-CM

## 2014-06-09 LAB — COMPREHENSIVE METABOLIC PANEL (CC13)
ALBUMIN: 4 g/dL (ref 3.5–5.0)
ALT: 16 U/L (ref 0–55)
AST: 17 U/L (ref 5–34)
Alkaline Phosphatase: 121 U/L (ref 40–150)
Anion Gap: 12 mEq/L — ABNORMAL HIGH (ref 3–11)
BILIRUBIN TOTAL: 0.52 mg/dL (ref 0.20–1.20)
BUN: 15.7 mg/dL (ref 7.0–26.0)
CO2: 22 mEq/L (ref 22–29)
Calcium: 9.4 mg/dL (ref 8.4–10.4)
Chloride: 108 mEq/L (ref 98–109)
Creatinine: 0.9 mg/dL (ref 0.6–1.1)
EGFR: 70 mL/min/{1.73_m2} — AB (ref >90)
GLUCOSE: 121 mg/dL (ref 70–140)
Potassium: 4.1 mEq/L (ref 3.5–5.1)
Sodium: 142 mEq/L (ref 136–145)
Total Protein: 7.1 g/dL (ref 6.4–8.3)

## 2014-06-09 LAB — CBC WITH DIFFERENTIAL/PLATELET
BASO%: 0.2 % (ref 0.0–2.0)
Basophils Absolute: 0 10*3/uL (ref 0.0–0.1)
EOS ABS: 0.1 10*3/uL (ref 0.0–0.5)
EOS%: 1.7 % (ref 0.0–7.0)
HEMATOCRIT: 44.1 % (ref 34.8–46.6)
HGB: 14.8 g/dL (ref 11.6–15.9)
LYMPH#: 2 10*3/uL (ref 0.9–3.3)
LYMPH%: 34.3 % (ref 14.0–49.7)
MCH: 30.9 pg (ref 25.1–34.0)
MCHC: 33.6 g/dL (ref 31.5–36.0)
MCV: 92.1 fL (ref 79.5–101.0)
MONO#: 0.5 10*3/uL (ref 0.1–0.9)
MONO%: 9.2 % (ref 0.0–14.0)
NEUT%: 54.6 % (ref 38.4–76.8)
NEUTROS ABS: 3.1 10*3/uL (ref 1.5–6.5)
Platelets: 223 10*3/uL (ref 145–400)
RBC: 4.79 10*6/uL (ref 3.70–5.45)
RDW: 12.8 % (ref 11.2–14.5)
WBC: 5.7 10*3/uL (ref 3.9–10.3)

## 2014-06-09 LAB — VITAMIN D 25 HYDROXY (VIT D DEFICIENCY, FRACTURES): Vit D, 25-Hydroxy: 37 ng/mL (ref 30–100)

## 2014-06-09 MED ORDER — GABAPENTIN 100 MG PO CAPS: 100.0000 mg | ORAL_CAPSULE | Freq: Three times a day (TID) | ORAL | Status: DC

## 2014-06-09 MED ORDER — CITALOPRAM HYDROBROMIDE 10 MG PO TABS: 10.0000 mg | ORAL_TABLET | Freq: Every day | ORAL | Status: DC

## 2014-06-09 MED ORDER — TAMOXIFEN CITRATE 20 MG PO TABS: 20.0000 mg | ORAL_TABLET | Freq: Every day | ORAL | Status: DC

## 2014-06-09 NOTE — Addendum Note (Signed)
Addended by: Marcelino Duster on: 06/09/2014 02:37 PM   Modules accepted: Orders

## 2014-06-09 NOTE — Progress Notes (Signed)
ID: Rachael Weaver OB: [birth year]  MR#: [id number]  EZM#:[id number]  PCP: No PCP Per Patient GYN:   SU: Osborn Coho OTHER MD: Thea Silversmith  CHIEF COMPLAINT:  Right Breast Cancer CURRENT TREATMENT: anastrozole daily  BREAT CANCER HISTORY:  From Dr Bernell List Khan's intake note 05/03/2012:  "Patient initially presented with increasing tenderness in her breasts. The pain seemed to be worse on the right which prompted a mammogram. The mammogram showed a 2.9 x 2.8 x 1.5 cm mass. 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 She was also noted to have an enlarged right axillary lymph node measuring 1.5 cm. She met with Dr. Eston Esters who did recommend  neoadjuvant chemotherapy. However patient opted for a lumpectomy up front.  and on 08/21/2011 patient had a right breast lumpectomy with sentinel lymph node biopsy. The final pathology revealed a grade 3 invasive ductal carcinoma measuring 3.1 cm with associated high-grade ductal carcinoma in situ all margins were negative. Three sentinel nodes were negative for metastatic disease. The tumor was estrogen receptor +100% per just her receptor-positive 59% HER-2/neu was amplified with a ratio of 2.8 to Ki-67 was 66%."  Her subsequent history is as detailed below.  INTERVAL HISTORY: Rachael Weaver is here today for follow up of her right breast cancer.  She has been on anastrozole for about 2 years and is here for discussion on switching to tamoxifen. She "worked through" her issues with anastrozole, but in general felt like she didn't tolerate it well. She has arthritic pain to her hands and an occasional trigger finger. She uses naproxen for her pain. Her main complaint is she thinks the anastrozole causes peripheral neuropathy, though she had lasting symptoms through from her pregnancy 21 years ago and some  worsening symptoms with chemotherapy. I explained this to her and she states that she feels this way because it has gotten worse in the last 6 months and she has not other explanation. It typically strikes her fingertips, but can move up her hands and arms (rarely).  REVIEW OF SYSTEMS: Rachael Weaver denies fevers, chills, nausea, vomiting, or changes in bowel or bladder habit. She has no shortness of breath, chest pain, cough, or palpitations. She denies headaches, dizziness, bruising, or bleeding. She is on celexa $RemoveB'10mg'yNyPnCVH$  daily and her mood is stable. She has only mild hot flashes and denies vaginal dryness. A detailed review of systems is otherwise stable.  PAST MEDICAL HISTORY: Past Medical History  Diagnosis Date  . Hypertension     borderline- no meds  . Headache(784.0)     USES OTC MEDS  . GERD (gastroesophageal reflux disease)   . Breast cancer     right breast  . Maintenance chemotherapy following disease   . Allergy     seasonal /rag weed  . Arthritis     hands feet  . Anxiety   . Cataract     no surgery yet  . Diabetes mellitus     when pregnant only 18 years ago,resolved  . Cancer   . Depression 05/03/2013    PAST SURGICAL HISTORY: Past Surgical History  Procedure Laterality Date  . Cesarean section    . Portacath placement  08/21/2011    Procedure: INSERTION PORT-A-CATH;  Surgeon: Haywood Lasso, MD;  Location: [facility];  Service: General;  Laterality: Left;  . Breast lumpectomy  08/21/11    right  . Port-a-cath removal N/A 04/13/2013    Procedure: MINOR REMOVAL PORT-A-CATH;  Surgeon: Haywood Lasso, MD;  Location: [facility];  Service: General;  Laterality: N/A;    FAMILY HISTORY Family History  Problem Relation Age of Onset  . Diabetes Paternal Grandfather   . Heart disease Paternal Grandfather   . Breast cancer Maternal Grandmother   . Cancer Maternal Grandmother     breast  . Hypertension Father   . Stroke Father   . Breast  cancer Maternal Aunt   . Cancer Maternal Aunt     breast  . Cancer Cousin 40    cervical or ovarian   the patient's father died at the age of 27 following multiple strokes in the setting of untreated hypertension. The patient's mother died at the age of 35 with congestive heart failure. The patient's mother's mother was diagnosed with breast cancer in her 53s and one of the patient's mother's 2 sisters, one of the patient's maternal aunts, was diagnosed with breast cancer in her 56s. Rachael Weaver herself has 5 brothers, no sisters. There is no other breast or ovarian cancer in the family to the patient's knowledge.  GYNECOLOGIC HISTORY:   (Updated 03/28/2013) Menarche age 18, first live birth age 26. The patient is GX P1. She went through menopause age 69. She did not take hormones.  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 She is divorced. At home is her adopted daughter Rachael Weaver (originally from [place]) who will soon be 24.  Her son Rachael Weaver is currently living in [place].   ADVANCED DIRECTIVES: Not in place   HEALTH MAINTENANCE: (Updated 03/28/2013) History  Substance Use Topics  . Smoking status: Former Smoker -- 1.00 packs/day    Types: Cigarettes    Quit date: 04/29/1971  . Smokeless tobacco: Never Used  . Alcohol Use: 0.0 oz/week    0-1 Glasses of wine per week     Comment: rarely social glass wine     Colonoscopy: Due  PAP: March 2013  Bone density: November 2013, osteopenia  Lipid panel: Not on file  Mammogram: 09/2013   No Known Allergies  Current Outpatient Prescriptions  Medication Sig Dispense Refill  . anastrozole (ARIMIDEX) 1 MG tablet Take 1 tablet (1 mg total) by mouth daily. 30 tablet 12  . aspirin 81 MG tablet Take 81 mg by mouth daily.    Marland Kitchen aspirin-acetaminophen-caffeine (EXCEDRIN MIGRAINE) 250-250-65 MG per tablet Take 1 tablet by mouth as needed.    . citalopram (CELEXA) 10 MG tablet Take 1 tablet (10 mg total) by mouth daily. 90  tablet 3  . cyanocobalamin 100 MCG tablet Take 100 mcg by mouth daily.    . fexofenadine (ALLEGRA) 30 MG tablet Take 30 mg by mouth as needed.    Marland Kitchen losartan (COZAAR) 50 MG tablet Take 1 tablet (50 mg total) by mouth daily. 90 tablet 3  . VITAMIN D, CHOLECALCIFEROL, PO Take 4,000 Units by mouth daily.     Marland Kitchen gabapentin (NEURONTIN) 100 MG capsule Take 1 capsule (100 mg total) by mouth 3 (three) times daily. 90 capsule 4  . Naproxen Sodium (ALEVE PO) Take by mouth.    . tamoxifen (NOLVADEX) 20 MG tablet Take 1 tablet (20 mg total) by mouth daily. 90 tablet 3   No current facility-administered medications for this visit.   Facility-Administered Medications Ordered in Other Visits  Medication Dose Route  Frequency Provider Last Rate Last Dose  . sodium chloride 0.9 % injection 10 mL  10 mL Intracatheter PRN Eston Esters, MD   10 mL at 12/25/11 1430    OBJECTIVE: Middle-aged white woman  in no acute distress  Vitals Filed Vitals:   06/09/14 0849  BP: 151/93  Pulse: 80  Temp: 98.8 F (37.1 C)  Resp: 18  Body mass index is 33.19 kg/(m^2). ECOG: 0 Filed Weights   06/09/14 0849  Weight: 184 lb 8 oz (83.689 kg)    Patient is well oriented with appropriate affect.  Skin: warm, dry  HEENT: sclerae anicteric, conjunctivae pink, oropharynx clear. No thrush or mucositis.  Lymph Nodes: No cervical or supraclavicular lymphadenopathy  Lungs: clear to auscultation bilaterally, no rales, wheezes, or rhonci  Heart: regular rate and rhythm  Abdomen: round, soft, non tender, positive bowel sounds  Musculoskeletal: No focal spinal tenderness, no peripheral edema  Neuro: non focal, well oriented, positive affect  Breast:  Left breast unremarkable. Right breast status post lumpectomy and radiation. Mild tenderness with palpation. No evidence of recurrent disease. Right axilla benign.   LAB RESULTS:   Lab Results  Component Value Date   WBC 5.7 06/09/2014   NEUTROABS 3.1 06/09/2014   HGB 14.8  06/09/2014   HCT 44.1 06/09/2014   MCV 92.1 06/09/2014   PLT 223 06/09/2014      Chemistry      Component Value Date/Time   NA 142 06/09/2014 0836   NA 141 12/18/2011 1205   K 4.1 06/09/2014 0836   K 3.6 12/18/2011 1205   CL 105 09/06/2012 0852   CL 106 12/18/2011 1205   CO2 22 06/09/2014 0836   CO2 27 12/18/2011 1205   BUN 15.7 06/09/2014 0836   BUN 15 12/18/2011 1205   CREATININE 0.9 06/09/2014 0836   CREATININE 0.83 12/18/2011 1205      Component Value Date/Time   CALCIUM 9.4 06/09/2014 0836   CALCIUM 9.2 12/18/2011 1205   ALKPHOS 121 06/09/2014 0836   ALKPHOS 83 12/18/2011 1205   AST 17 06/09/2014 0836   AST 23 12/18/2011 1205   ALT 16 06/09/2014 0836   ALT 31 12/18/2011 1205   BILITOT 0.52 06/09/2014 0836   BILITOT 0.5 12/18/2011 1205      STUDIES:   Most recent bone density 03/13/2014 showed osteopenia with a T score of -1.6.  Bilateral mammogram on 10/13/2013 was unremarkable.   ASSESSMENT: 64 y.o. [place] woman  (1) status post right lumpectomy and sentinel lymph node sampling 08/21/2011 for a pT2 pN0, stage IIA invasive ductal carcinoma, grade 3, estrogen receptor 100% and progesterone receptor 59% positive, with an MIB-1 of 66% and HER-2 amplification by CISH with a ratio of 2.82  (2) treated adjuvantly with carboplatin, docetaxel and trastuzumab x6 completed 12/18/2011  (3) completed adjuvant radiation 02/24/2012  (4) completed 1 year of trastuzumab 09/06/2012; echo 08/23/2012 shows a well-preserved ejection fraction  (5) started anastrozole September of 2013, held December 2014 for drug holiday, restarted January 2014. Stopped again February 2016. Tamoxifen to start March 2016.   (6)  DEXA scan 03/11/2012 showed minimal osteopenia (T score -1.2)  PLAN:  Rachael Weaver is doing relatively well today. The labs were reviewed in detail and were relatively stable. We discussed switching from anastrozole to tamoxifen today, as she has been on anastrozole for 2  full years now. She is in agreement with the plan because of her osteopenia and arthritic symptoms that are starting to worsen. We spent about  20 minutes discussing tamoxifen's various side effects and I gave her a handout with this information. She is overdue for a pap smear and has plans to make that appointment soon.   For her neuropathy symptoms we are going to try her out on gabapentin $RemoveBefor'100mg'SgsfQudkCBEo$  TID and reassess when she returns. I refilled her celexa during this visit.  Rachael Weaver will stop the anastrozole today and wait 4 weeks before starting on the tamoxifen. She will return for labs and a follow up visit in 4 months. She understands and agrees with this plan. She knows the goal of treatment in her case is cure. She has been encouraged to call with any issues that might arise before her next visit here.  Marcelino Duster, [place] [phone] 06/09/2014 9:31 AM

## 2014-10-13 ENCOUNTER — Other Ambulatory Visit: Payer: Self-pay

## 2014-10-13 ENCOUNTER — Ambulatory Visit: Payer: Self-pay | Admitting: Nurse Practitioner

## 2014-10-18 ENCOUNTER — Encounter: Payer: Self-pay | Admitting: Nurse Practitioner

## 2014-10-18 ENCOUNTER — Other Ambulatory Visit (HOSPITAL_BASED_OUTPATIENT_CLINIC_OR_DEPARTMENT_OTHER): Payer: Self-pay

## 2014-10-18 ENCOUNTER — Ambulatory Visit (HOSPITAL_BASED_OUTPATIENT_CLINIC_OR_DEPARTMENT_OTHER): Payer: Self-pay | Admitting: Nurse Practitioner

## 2014-10-18 ENCOUNTER — Telehealth: Payer: Self-pay | Admitting: Oncology

## 2014-10-18 ENCOUNTER — Other Ambulatory Visit: Payer: Self-pay | Admitting: *Deleted

## 2014-10-18 VITALS — BP 143/76 | HR 87 | Temp 98.6°F | Resp 18 | Ht 62.5 in | Wt 182.7 lb

## 2014-10-18 DIAGNOSIS — C50411 Malignant neoplasm of upper-outer quadrant of right female breast: Secondary | ICD-10-CM

## 2014-10-18 DIAGNOSIS — M8580 Other specified disorders of bone density and structure, unspecified site: Secondary | ICD-10-CM

## 2014-10-18 DIAGNOSIS — C50811 Malignant neoplasm of overlapping sites of right female breast: Secondary | ICD-10-CM

## 2014-10-18 DIAGNOSIS — G629 Polyneuropathy, unspecified: Secondary | ICD-10-CM

## 2014-10-18 DIAGNOSIS — R232 Flushing: Secondary | ICD-10-CM | POA: Insufficient documentation

## 2014-10-18 LAB — COMPREHENSIVE METABOLIC PANEL (CC13)
ALBUMIN: 3.6 g/dL (ref 3.5–5.0)
ALT: 14 U/L (ref 0–55)
AST: 17 U/L (ref 5–34)
Alkaline Phosphatase: 87 U/L (ref 40–150)
Anion Gap: 8 mEq/L (ref 3–11)
BUN: 13.3 mg/dL (ref 7.0–26.0)
CO2: 25 mEq/L (ref 22–29)
Calcium: 8.7 mg/dL (ref 8.4–10.4)
Chloride: 108 mEq/L (ref 98–109)
Creatinine: 0.8 mg/dL (ref 0.6–1.1)
EGFR: 78 mL/min/{1.73_m2} — ABNORMAL LOW (ref >90)
Glucose: 149 mg/dl — ABNORMAL HIGH (ref 70–140)
POTASSIUM: 3.8 meq/L (ref 3.5–5.1)
Sodium: 141 mEq/L (ref 136–145)
Total Bilirubin: 0.51 mg/dL (ref 0.20–1.20)
Total Protein: 6.5 g/dL (ref 6.4–8.3)

## 2014-10-18 LAB — CBC WITH DIFFERENTIAL/PLATELET
BASO%: 0.2 % (ref 0.0–2.0)
Basophils Absolute: 0 10*3/uL (ref 0.0–0.1)
EOS%: 1.7 % (ref 0.0–7.0)
Eosinophils Absolute: 0.1 10*3/uL (ref 0.0–0.5)
HCT: 39.8 % (ref 34.8–46.6)
HGB: 13.5 g/dL (ref 11.6–15.9)
LYMPH%: 36.1 % (ref 14.0–49.7)
MCH: 31 pg (ref 25.1–34.0)
MCHC: 33.9 g/dL (ref 31.5–36.0)
MCV: 91.5 fL (ref 79.5–101.0)
MONO#: 0.3 10*3/uL (ref 0.1–0.9)
MONO%: 5.8 % (ref 0.0–14.0)
NEUT%: 56.2 % (ref 38.4–76.8)
NEUTROS ABS: 3 10*3/uL (ref 1.5–6.5)
PLATELETS: 199 10*3/uL (ref 145–400)
RBC: 4.35 10*6/uL (ref 3.70–5.45)
RDW: 12.8 % (ref 11.2–14.5)
WBC: 5.4 10*3/uL (ref 3.9–10.3)
lymph#: 1.9 10*3/uL (ref 0.9–3.3)

## 2014-10-18 MED ORDER — TAMOXIFEN CITRATE 20 MG PO TABS: 20.0000 mg | ORAL_TABLET | Freq: Every day | ORAL | Status: DC

## 2014-10-18 NOTE — Progress Notes (Signed)
ID: Rachael Weaver OB: [birth year]  MR#: [id number]  JJK#:[id number]  PCP: No PCP Per Patient GYN:   SU: [facility] OTHER MD: Thea Silversmith  CHIEF COMPLAINT:  Right Breast Cancer CURRENT TREATMENT: anastrozole daily  BREAT CANCER HISTORY:  From Dr Bernell List Khan's intake note 05/03/2012:  "Patient initially presented with increasing tenderness in her breasts. The pain seemed to be worse on the right which prompted a mammogram. The mammogram showed a 2.9 x 2.8 x 1.5 cm mass. 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 She was also noted to have an enlarged right axillary lymph node measuring 1.5 cm. She met with Dr. Eston Esters who did recommend  neoadjuvant chemotherapy. However patient opted for a lumpectomy up front.  and on 08/21/2011 patient had a right breast lumpectomy with sentinel lymph node biopsy. The final pathology revealed a grade 3 invasive ductal carcinoma measuring 3.1 cm with associated high-grade ductal carcinoma in situ all margins were negative. Three sentinel nodes were negative for metastatic disease. The tumor was estrogen receptor +100% per just her receptor-positive 59% HER-2/neu was amplified with a ratio of 2.8 to Ki-67 was 66%."  Her subsequent history is as detailed below.  INTERVAL HISTORY: Rachael Weaver is here today for follow up of her right breast cancer.  At her last visit we switched from anastrozole to tamoxifen because of poor tolerance. She is happy to report that all of her joint aches have resolved and that she is tolerating the tamoxifen well. She has mild hot flashes, but denies vaginal changes. The interval history is otherwise unremarkable.  REVIEW OF SYSTEMS: Rachael Weaver denies fevers, chills, nausea, vomiting, or changes in bowel or bladder habits. Her appetite is healthy. Her energy level is poor. She  does not exercise regularly. She has no shortness of breath, chest pain, cough, or palpitations. She denies headaches and vision changes, but she has occasional vertigo. She is on celexa $RemoveB'10mg'nioKVFKM$  daily and her mood is stable. 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  A detailed review of systems is otherwise stable.  PAST MEDICAL HISTORY: Past Medical History  Diagnosis Date  . Hypertension     borderline- no meds  . Headache(784.0)     USES OTC MEDS  . GERD (gastroesophageal reflux disease)   . Breast cancer     right breast  . Maintenance chemotherapy following disease   . Allergy     seasonal /rag weed  . Arthritis     hands feet  . Anxiety   . Cataract     no surgery yet  . Diabetes mellitus     when pregnant only 18 years ago,resolved  . Cancer   . Depression 05/03/2013    PAST SURGICAL HISTORY: Past Surgical History  Procedure Laterality Date  . Cesarean section    . Portacath placement  08/21/2011    Procedure: INSERTION PORT-A-CATH;  Surgeon: Haywood Lasso, MD;  Location: [facility];  Service: General;  Laterality: Left;  . Breast lumpectomy  08/21/11    right  . Port-a-cath removal N/A 04/13/2013    Procedure: MINOR REMOVAL PORT-A-CATH;  Surgeon: Haywood Lasso, MD;  Location: [facility];  Service: General;  Laterality: N/A;    FAMILY HISTORY Family History  Problem Relation Age of Onset  . Diabetes Paternal Grandfather   . Heart disease Paternal Grandfather   . Breast cancer Maternal Grandmother   . Cancer Maternal Grandmother     breast  . Hypertension Father   . Stroke Father   . Breast cancer Maternal Aunt   . Cancer Maternal Aunt     breast  . Cancer Cousin 40    cervical or ovarian   the patient's father died at the age of 48 following multiple strokes in the setting of untreated hypertension. The patient's mother died at the age of 86 with congestive heart  failure. The patient's mother's mother was diagnosed with breast cancer in her 62s and one of the patient's mother's 2 sisters, one of the patient's maternal aunts, was diagnosed with breast cancer in her 62s. Rachael Weaver herself has 5 brothers, no sisters. There is no other breast or ovarian cancer in the family to the patient's knowledge.  GYNECOLOGIC HISTORY:   (Updated 03/28/2013) Menarche age 80, first live birth age 76. The patient is GX P1. She went through menopause age 28. She did not take hormones.  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 She is divorced. At home is her adopted daughter Rachael Weaver (originally from [place]) who will soon be 86.  Her son Rachael Weaver is currently living in [place].   ADVANCED DIRECTIVES: Not in place   HEALTH MAINTENANCE: (Updated 03/28/2013) History  Substance Use Topics  . Smoking status: Former Smoker -- 1.00 packs/day    Types: Cigarettes    Quit date: 04/29/1971  . Smokeless tobacco: Never Used  . Alcohol Use: 0.0 oz/week    0-1 Glasses of wine per week     Comment: rarely social glass wine     Colonoscopy: Due  PAP: March 2013  Bone density: November 2013, osteopenia  Lipid panel: Not on file  Mammogram: 09/2013   No Known Allergies  Current Outpatient Prescriptions  Medication Sig Dispense Refill  . aspirin 81 MG tablet Take 81 mg by mouth daily.    . citalopram (CELEXA) 10 MG tablet Take 1 tablet (10 mg total) by mouth daily. 90 tablet 3  . cyanocobalamin 100 MCG tablet Take 100 mcg by mouth daily.    Marland Kitchen losartan (COZAAR) 50 MG tablet Take 1 tablet (50 mg total) by mouth daily. 90 tablet 3  . Naproxen Sodium (ALEVE PO) Take by mouth.    . tamoxifen (NOLVADEX) 20 MG tablet Take 1 tablet (20 mg total) by mouth daily. 90 tablet 3  . VITAMIN D, CHOLECALCIFEROL, PO Take 4,000 Units by mouth daily.     Marland Kitchen aspirin-acetaminophen-caffeine (EXCEDRIN MIGRAINE) 250-250-65 MG per tablet Take 1 tablet by mouth as needed.     . fexofenadine (ALLEGRA) 30 MG tablet Take 30 mg by mouth as needed.    . gabapentin (NEURONTIN) 100 MG capsule Take 1 capsule (100 mg total) by mouth 3 (three) times daily. (Patient not taking: Reported on 10/18/2014) 90 capsule 4   No current facility-administered medications for this visit.   Facility-Administered Medications Ordered in Other Visits  Medication Dose Route Frequency Provider Last Rate Last Dose  . sodium chloride 0.9 % injection 10 mL  10 mL Intracatheter PRN Eston Esters, MD   10 mL at 12/25/11 1430    OBJECTIVE: Middle-aged white woman  in no acute distress  Vitals Filed Vitals:   10/18/14 0854  BP: 143/76  Pulse: 87  Temp: 98.6 F (37 C)  Resp:  18  Body mass index is 32.86 kg/(m^2). ECOG: 0 Filed Weights   10/18/14 0854  Weight: 182 lb 11.2 oz (82.872 kg)    Skin: warm, dry  HEENT: sclerae anicteric, conjunctivae pink, oropharynx clear. No thrush or mucositis.  Lymph Nodes: No cervical or supraclavicular lymphadenopathy  Lungs: clear to auscultation bilaterally, no rales, wheezes, or rhonci  Heart: regular rate and rhythm  Abdomen: round, soft, non tender, positive bowel sounds  Musculoskeletal: No focal spinal tenderness, no peripheral edema  Neuro: non focal, well oriented, positive affect  Breast:  Left breast unremarkable. Right breast status post lumpectomy and radiation. Marked tenderness with palpation. No evidence of recurrent disease. Right axilla benign.   LAB RESULTS:   Lab Results  Component Value Date   WBC 5.4 10/18/2014   NEUTROABS 3.0 10/18/2014   HGB 13.5 10/18/2014   HCT 39.8 10/18/2014   MCV 91.5 10/18/2014   PLT 199 10/18/2014      Chemistry      Component Value Date/Time   NA 142 06/09/2014 0836   NA 141 12/18/2011 1205   K 4.1 06/09/2014 0836   K 3.6 12/18/2011 1205   CL 105 09/06/2012 0852   CL 106 12/18/2011 1205   CO2 22 06/09/2014 0836   CO2 27 12/18/2011 1205   BUN 15.7 06/09/2014 0836   BUN 15 12/18/2011  1205   CREATININE 0.9 06/09/2014 0836   CREATININE 0.83 12/18/2011 1205      Component Value Date/Time   CALCIUM 9.4 06/09/2014 0836   CALCIUM 9.2 12/18/2011 1205   ALKPHOS 121 06/09/2014 0836   ALKPHOS 83 12/18/2011 1205   AST 17 06/09/2014 0836   AST 23 12/18/2011 1205   ALT 16 06/09/2014 0836   ALT 31 12/18/2011 1205   BILITOT 0.52 06/09/2014 0836   BILITOT 0.5 12/18/2011 1205      STUDIES:   Most recent bone density 03/13/2014 showed osteopenia with a T score of -1.6.  Bilateral mammogram on 10/13/2013 was unremarkable.   ASSESSMENT: 64 y.o. [place] woman  (1) status post right lumpectomy and sentinel lymph node sampling 08/21/2011 for a pT2 pN0, stage IIA invasive ductal carcinoma, grade 3, estrogen receptor 100% and progesterone receptor 59% positive, with an MIB-1 of 66% and HER-2 amplification by CISH with a ratio of 2.82  (2) treated adjuvantly with carboplatin, docetaxel and trastuzumab x6 completed 12/18/2011  (3) completed adjuvant radiation 02/24/2012  (4) completed 1 year of trastuzumab 09/06/2012; echo 08/23/2012 shows a well-preserved ejection fraction  (5) started anastrozole September of 2013, held December 2014 for drug holiday, restarted January 2014. Stopped again February 2016. Tamoxifen to start March 2016.   (6)  DEXA scan 03/11/2012 showed minimal osteopenia (T score -1.2)  PLAN:  Sheralyn is doing well as far as her breast cancer is concerned. The CBC was reviewed in detail and was entirely stable. The CMET was not yet available for review. She is tolerating the tamoxifen well with few complaints.   She plans to start the gabapentin this week for her neuropathy, which would also help with her hot flashes.  She is due for a mammogram this month so I have placed orders for this to be performed at the breast center. She is overdue for a pap smear, but has plans to schedule one soon.  Latoiya will return in 6 months for labs and a follow up visit. She  understands and agrees with this plan. She knows the goal of treatment in her case is cure.  She has been encouraged to call with any issues that might arise before her next visit here.   Laurie Panda, NP 10/18/2014 9:19 AM

## 2014-10-18 NOTE — Telephone Encounter (Signed)
Gave avs & calendar for June/July.  °

## 2015-02-02 ENCOUNTER — Other Ambulatory Visit: Payer: Self-pay | Admitting: Family Medicine

## 2015-02-06 ENCOUNTER — Other Ambulatory Visit: Payer: Self-pay

## 2015-02-06 DIAGNOSIS — F329 Major depressive disorder, single episode, unspecified: Secondary | ICD-10-CM

## 2015-02-06 DIAGNOSIS — F32A Depression, unspecified: Secondary | ICD-10-CM

## 2015-02-06 MED ORDER — CITALOPRAM HYDROBROMIDE 10 MG PO TABS: 10.0000 mg | ORAL_TABLET | Freq: Every day | ORAL | Status: DC

## 2015-04-03 ENCOUNTER — Ambulatory Visit (INDEPENDENT_AMBULATORY_CARE_PROVIDER_SITE_OTHER): Payer: Self-pay | Admitting: Family Medicine

## 2015-04-03 VITALS — BP 138/80 | HR 88 | Temp 99.0°F | Resp 16 | Ht 63.0 in | Wt 164.0 lb

## 2015-04-03 DIAGNOSIS — R7309 Other abnormal glucose: Secondary | ICD-10-CM

## 2015-04-03 DIAGNOSIS — R7303 Prediabetes: Secondary | ICD-10-CM

## 2015-04-03 DIAGNOSIS — I10 Essential (primary) hypertension: Secondary | ICD-10-CM

## 2015-04-03 DIAGNOSIS — Z23 Encounter for immunization: Secondary | ICD-10-CM

## 2015-04-03 LAB — POCT GLYCOSYLATED HEMOGLOBIN (HGB A1C): Hemoglobin A1C: 6.5

## 2015-04-03 LAB — GLUCOSE, POCT (MANUAL RESULT ENTRY): POC GLUCOSE: 102 mg/dL — AB (ref 70–99)

## 2015-04-03 MED ORDER — LOSARTAN POTASSIUM 50 MG PO TABS
ORAL_TABLET | ORAL | Status: DC
Start: 2015-04-03 — End: 2016-05-13

## 2015-04-03 MED ORDER — BLOOD GLUCOSE METER KIT: PACK | Status: DC

## 2015-04-03 NOTE — Progress Notes (Signed)
Patient ID: Rachael Weaver, female    DOB: 04/28/1951  Age: 64 y.o. MRN: [id number]  Chief Complaint  Patient presents with  . Medication Refill    Lasartan  . Flu Vaccine    Subjective:   64 year old lady who is here for a recheck with regard to her blood pressure and blood pressure medicine. She has been out for a few days. She felt a little lightheaded today. Otherwise she has been quite well. She takes tamoxifen post breast cancer but that is done well. No headaches or dizziness to speak of. No chest pains or shortness of breath. She is due a flu shot today.  Current allergies, medications, problem list, past/family and social histories reviewed.  Objective:  BP 138/80 mmHg  Pulse 88  Temp(Src) 99 F (37.2 C) (Oral)  Resp 16  Ht $R'5\' 3"'zM$  (1.6 m)  Wt 164 lb (74.39 kg)  BMI 29.06 kg/m2  SpO2 97%  No major acute distress. Neck supple without nodes or thyromegaly. Chest clear. Heart regular without murmurs.  Assessment & Plan:   Assessment: 1. Essential hypertension   2. Elevated glucose   3. Needs flu shot   4. Prediabetes       Plan: I note that her sugar on random specimen 5 months ago was a little high, so will recheck that. Flu shot.   Results for orders placed or performed in visit on 04/03/15  POCT glucose (manual entry)  Result Value Ref Range   POC Glucose 102 (A) 70 - 99 mg/dl  POCT glycosylated hemoglobin (Hb A1C)  Result Value Ref Range   Hemoglobin A1C 6.5     Orders Placed This Encounter  Procedures  . Flu Vaccine QUAD 36+ mos IM  . Basic metabolic panel  . POCT glucose (manual entry)  . POCT glycosylated hemoglobin (Hb A1C)    Meds ordered this encounter  Medications  . losartan (COZAAR) 50 MG tablet    Sig: TAKE 1 TABLET (50 MG TOTAL) BY MOUTH DAILY.    Dispense:  90 tablet    Refill:  3  . blood glucose meter kit and supplies    Sig: Dispense based on patient and insurance preference. Use up to four times daily as directed. (FOR ICD-9 250.00,  250.01).    Dispense:  1 each    Refill:  0    Order Specific Question:  Number of strips    Answer:  100    Order Specific Question:  Number of lancets    Answer:  100    Discussed being diabetic. She is actually right on the borderline and I called her prediabetic since the glucose is normal and the A1c is only 6.5. She needs to work hard at [employer]. We talked a while about this. Did not put on any medicines yet, but may need to be on some metformin if level persists. Return in about 2 months.     Patient Instructions  Continue your current blood pressure medicine, losartan, one daily  Get regular exercise, recommend 30 minutes at least 5 days a week of active exercise  Minimize your starch intake of breads, pastas, potatoes, rice, sweets, etc.  Go online and read the American Diabetic Association website and learn as much as you can about diabetes  No new medications today  Check your blood sugars from time to time in the morning on a fasting sample and 2 hours after a main meal. Keep a record of these and bring it in  next visit.  Set goals to try and lose some weight  Return sooner if problems  Influenza Virus Vaccine injection (Fluarix) What is this medicine? INFLUENZA VIRUS VACCINE (in floo EN zuh VAHY ruhs vak SEEN) helps to reduce the risk of getting influenza also known as the flu. This medicine may be used for other purposes; ask your health care provider or pharmacist if you have questions. What should I tell my health care provider before I take this medicine? They need to know if you have any of these conditions: -bleeding disorder like hemophilia -fever or infection -Guillain-Barre syndrome or other neurological problems -immune system problems -infection with the human immunodeficiency virus (HIV) or AIDS -low blood platelet counts -multiple sclerosis -an unusual or allergic reaction to influenza virus vaccine, eggs, chicken proteins, latex,  gentamicin, other medicines, foods, dyes or preservatives -pregnant or trying to get pregnant -breast-feeding How should I use this medicine? This vaccine is for injection into a muscle. It is given by a health care professional. A copy of Vaccine Information Statements will be given before each vaccination. Read this sheet carefully each time. The sheet may change frequently. Talk to your pediatrician regarding the use of this medicine in children. Special care may be needed. Overdosage: If you think you have taken too much of this medicine contact a poison control center or emergency room at once. NOTE: This medicine is only for you. Do not share this medicine with others. What if I miss a dose? This does not apply. What may interact with this medicine? -chemotherapy or radiation therapy -medicines that lower your immune system like etanercept, anakinra, infliximab, and adalimumab -medicines that treat or prevent blood clots like warfarin -phenytoin -steroid medicines like prednisone or cortisone -theophylline -vaccines This list may not describe all possible interactions. Give your health care provider a list of all the medicines, herbs, non-prescription drugs, or dietary supplements you use. Also tell them if you smoke, drink alcohol, or use illegal drugs. Some items may interact with your medicine. What should I watch for while using this medicine? Report any side effects that do not go away within 3 days to your doctor or health care professional. Call your health care provider if any unusual symptoms occur within 6 weeks of receiving this vaccine. You may still catch the flu, but the illness is not usually as bad. You cannot get the flu from the vaccine. The vaccine will not protect against colds or other illnesses that may cause fever. The vaccine is needed every year. What side effects may I notice from receiving this medicine? Side effects that you should report to your doctor or  health care professional as soon as possible: -allergic reactions like skin rash, itching or hives, swelling of the face, lips, or tongue Side effects that usually do not require medical attention (report to your doctor or health care professional if they continue or are bothersome): -fever -headache -muscle aches and pains -pain, tenderness, redness, or swelling at site where injected -weak or tired This list may not describe all possible side effects. Call your doctor for medical advice about side effects. You may report side effects to FDA at 1-800-FDA-1088. Where should I keep my medicine? This vaccine is only given in a clinic, pharmacy, doctor's office, or other health care setting and will not be stored at home. NOTE: This sheet is a summary. It may not cover all possible information. If you have questions about this medicine, talk to your doctor, pharmacist, or health  care provider.    2016, Elsevier/Gold Standard. (2007-11-10 09:30:40)      Return in about 2 months (around 06/04/2015).   HOPPER,DAVID, MD 04/03/2015

## 2015-04-03 NOTE — Patient Instructions (Addendum)
Continue your current blood pressure medicine, losartan, one daily  Get regular exercise, recommend 30 minutes at least 5 days a week of active exercise  Minimize your starch intake of breads, pastas, potatoes, rice, sweets, etc.  Go online and read the American Diabetic Association website and learn as much as you can about diabetes  No new medications today  Check your blood sugars from time to time in the morning on a fasting sample and 2 hours after a main meal. Keep a record of these and bring it in next visit.  Set goals to try and lose some weight  Return sooner if problems  Influenza Virus Vaccine injection (Fluarix) What is this medicine? INFLUENZA VIRUS VACCINE (in floo EN zuh VAHY ruhs vak SEEN) helps to reduce the risk of getting influenza also known as the flu. This medicine may be used for other purposes; ask your health care provider or pharmacist if you have questions. What should I tell my health care provider before I take this medicine? They need to know if you have any of these conditions: -bleeding disorder like hemophilia -fever or infection -Guillain-Barre syndrome or other neurological problems -immune system problems -infection with the human immunodeficiency virus (HIV) or AIDS -low blood platelet counts -multiple sclerosis -an unusual or allergic reaction to influenza virus vaccine, eggs, chicken proteins, latex, gentamicin, other medicines, foods, dyes or preservatives -pregnant or trying to get pregnant -breast-feeding How should I use this medicine? This vaccine is for injection into a muscle. It is given by a health care professional. A copy of Vaccine Information Statements will be given before each vaccination. Read this sheet carefully each time. The sheet may change frequently. Talk to your pediatrician regarding the use of this medicine in children. Special care may be needed. Overdosage: If you think you have taken too much of this medicine  contact a poison control center or emergency room at once. NOTE: This medicine is only for you. Do not share this medicine with others. What if I miss a dose? This does not apply. What may interact with this medicine? -chemotherapy or radiation therapy -medicines that lower your immune system like etanercept, anakinra, infliximab, and adalimumab -medicines that treat or prevent blood clots like warfarin -phenytoin -steroid medicines like prednisone or cortisone -theophylline -vaccines This list may not describe all possible interactions. Give your health care provider a list of all the medicines, herbs, non-prescription drugs, or dietary supplements you use. Also tell them if you smoke, drink alcohol, or use illegal drugs. Some items may interact with your medicine. What should I watch for while using this medicine? Report any side effects that do not go away within 3 days to your doctor or health care professional. Call your health care provider if any unusual symptoms occur within 6 weeks of receiving this vaccine. You may still catch the flu, but the illness is not usually as bad. You cannot get the flu from the vaccine. The vaccine will not protect against colds or other illnesses that may cause fever. The vaccine is needed every year. What side effects may I notice from receiving this medicine? Side effects that you should report to your doctor or health care professional as soon as possible: -allergic reactions like skin rash, itching or hives, swelling of the face, lips, or tongue Side effects that usually do not require medical attention (report to your doctor or health care professional if they continue or are bothersome): -fever -headache -muscle aches and pains -pain, tenderness, redness,  or swelling at site where injected -weak or tired This list may not describe all possible side effects. Call your doctor for medical advice about side effects. You may report side effects to FDA  at 1-800-FDA-1088. Where should I keep my medicine? This vaccine is only given in a clinic, pharmacy, doctor's office, or other health care setting and will not be stored at home. NOTE: This sheet is a summary. It may not cover all possible information. If you have questions about this medicine, talk to your doctor, pharmacist, or health care provider.    2016, Elsevier/Gold Standard. (2007-11-10 09:30:40)

## 2015-04-04 LAB — BASIC METABOLIC PANEL
BUN: 14 mg/dL (ref 7–25)
CHLORIDE: 104 mmol/L (ref 98–110)
CO2: 21 mmol/L (ref 20–31)
Calcium: 8.6 mg/dL (ref 8.6–10.4)
Creat: 0.77 mg/dL (ref 0.50–0.99)
GLUCOSE: 102 mg/dL — AB (ref 65–99)
Potassium: 3.7 mmol/L (ref 3.5–5.3)
Sodium: 138 mmol/L (ref 135–146)

## 2015-04-05 ENCOUNTER — Other Ambulatory Visit: Payer: Self-pay | Admitting: Oncology

## 2015-04-05 DIAGNOSIS — Z853 Personal history of malignant neoplasm of breast: Secondary | ICD-10-CM

## 2015-04-11 ENCOUNTER — Ambulatory Visit
Admission: RE | Admit: 2015-04-11 | Discharge: 2015-04-11 | Disposition: A | Discharge status: Home / Self Care | Payer: No Typology Code available for payment source | Source: Ambulatory Visit | Attending: Oncology | Admitting: Oncology

## 2015-04-11 DIAGNOSIS — Z853 Personal history of malignant neoplasm of breast: Secondary | ICD-10-CM

## 2015-04-17 ENCOUNTER — Other Ambulatory Visit: Payer: Self-pay

## 2015-04-17 DIAGNOSIS — C50911 Malignant neoplasm of unspecified site of right female breast: Secondary | ICD-10-CM

## 2015-04-17 DIAGNOSIS — C50411 Malignant neoplasm of upper-outer quadrant of right female breast: Secondary | ICD-10-CM

## 2015-04-18 ENCOUNTER — Telehealth: Payer: Self-pay | Admitting: Oncology

## 2015-04-18 ENCOUNTER — Other Ambulatory Visit (HOSPITAL_BASED_OUTPATIENT_CLINIC_OR_DEPARTMENT_OTHER): Payer: Self-pay

## 2015-04-18 ENCOUNTER — Ambulatory Visit (HOSPITAL_BASED_OUTPATIENT_CLINIC_OR_DEPARTMENT_OTHER): Payer: Self-pay | Admitting: Oncology

## 2015-04-18 VITALS — BP 133/69 | HR 83 | Temp 98.5°F | Resp 18 | Ht 63.0 in | Wt 184.5 lb

## 2015-04-18 DIAGNOSIS — C50411 Malignant neoplasm of upper-outer quadrant of right female breast: Secondary | ICD-10-CM

## 2015-04-18 DIAGNOSIS — Z79810 Long term (current) use of selective estrogen receptor modulators (SERMs): Secondary | ICD-10-CM

## 2015-04-18 DIAGNOSIS — C50911 Malignant neoplasm of unspecified site of right female breast: Secondary | ICD-10-CM

## 2015-04-18 DIAGNOSIS — Z170 Estrogen receptor positive status [ER+]: Secondary | ICD-10-CM

## 2015-04-18 DIAGNOSIS — M8580 Other specified disorders of bone density and structure, unspecified site: Secondary | ICD-10-CM

## 2015-04-18 LAB — CBC WITH DIFFERENTIAL/PLATELET
BASO%: 0.2 % (ref 0.0–2.0)
BASOS ABS: 0 10*3/uL (ref 0.0–0.1)
EOS%: 2.2 % (ref 0.0–7.0)
Eosinophils Absolute: 0.1 10*3/uL (ref 0.0–0.5)
HEMATOCRIT: 41.2 % (ref 34.8–46.6)
HGB: 13.6 g/dL (ref 11.6–15.9)
LYMPH#: 2.1 10*3/uL (ref 0.9–3.3)
LYMPH%: 37.8 % (ref 14.0–49.7)
MCH: 30.8 pg (ref 25.1–34.0)
MCHC: 33 g/dL (ref 31.5–36.0)
MCV: 93.2 fL (ref 79.5–101.0)
MONO#: 0.5 10*3/uL (ref 0.1–0.9)
MONO%: 8.4 % (ref 0.0–14.0)
NEUT#: 2.8 10*3/uL (ref 1.5–6.5)
NEUT%: 51.4 % (ref 38.4–76.8)
PLATELETS: 161 10*3/uL (ref 145–400)
RBC: 4.42 10*6/uL (ref 3.70–5.45)
RDW: 12.7 % (ref 11.2–14.5)
WBC: 5.5 10*3/uL (ref 3.9–10.3)

## 2015-04-18 LAB — COMPREHENSIVE METABOLIC PANEL
ALT: 13 U/L (ref 0–55)
ANION GAP: 11 meq/L (ref 3–11)
AST: 20 U/L (ref 5–34)
Albumin: 3.7 g/dL (ref 3.5–5.0)
Alkaline Phosphatase: 77 U/L (ref 40–150)
BUN: 14.8 mg/dL (ref 7.0–26.0)
CALCIUM: 9.1 mg/dL (ref 8.4–10.4)
CHLORIDE: 106 meq/L (ref 98–109)
CO2: 24 mEq/L (ref 22–29)
CREATININE: 0.9 mg/dL (ref 0.6–1.1)
EGFR: 65 mL/min/{1.73_m2} — AB (ref >90)
Glucose: 132 mg/dl (ref 70–140)
POTASSIUM: 4.1 meq/L (ref 3.5–5.1)
Sodium: 141 mEq/L (ref 136–145)
Total Bilirubin: 0.52 mg/dL (ref 0.20–1.20)
Total Protein: 6.9 g/dL (ref 6.4–8.3)

## 2015-04-18 MED ORDER — TAMOXIFEN CITRATE 20 MG PO TABS: 20.0000 mg | ORAL_TABLET | Freq: Every day | ORAL | Status: DC

## 2015-04-18 NOTE — Telephone Encounter (Signed)
2018 is not open at this time for dr Jana Hakim and she will call us at a later date next year to book,she will also call dr Wynetta Emery for colon screening

## 2015-04-18 NOTE — Progress Notes (Signed)
ID: Rachael Weaver OB: November 11, 1950  MR#: [id number]  JJK#:[id number]  PCP: No PCP Per Patient GYN:   SU: Rachael Weaver OTHER MD: Rachael Weaver  CHIEF COMPLAINT:  Right Breast Cancer CURRENT TREATMENT: anastrozole daily  BREAT CANCER HISTORY:  From Dr Rachael Weaver's intake note 05/03/2012:  "Patient initially presented with increasing tenderness in her breasts. The pain seemed to be worse on the right which prompted a mammogram. The mammogram showed a 2.9 x 2.8 x 1.5 cm mass. 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 She was also noted to have an enlarged right axillary lymph node measuring 1.5 cm. She met with Dr. Eston Weaver who did recommend  neoadjuvant chemotherapy. However patient opted for a lumpectomy up front.  and on 08/21/2011 patient had a right breast lumpectomy with sentinel lymph node biopsy. The final pathology revealed a grade 3 invasive ductal carcinoma measuring 3.1 cm with associated high-grade ductal carcinoma in situ all margins were negative. Three sentinel nodes were negative for metastatic disease. The tumor was estrogen receptor +100% per just her receptor-positive 59% HER-2/neu was amplified with a ratio of 2.8 to Ki-67 was 66%."  Her subsequent history is as detailed below.  INTERVAL HISTORY: Rachael Weaver today for follow-up of her estrogen receptor positive breast cancer. The interval history is generally unremarkable. She continues on tamoxifen, which she is tolerating well. She doesn't have the arthralgias and myalgias that she had with the aromatase inhibitors. She does have "slow burns" especially in the morning. She doesn't have the classic hot flashes or night sweats. She also has no problems with vaginal witness or dryness. She obtains a drug in approximately $3 a month  REVIEW OF SYSTEMS: Rachael Weaver  Has  a long history of reflux which is a little bit more symptomatic currently. She has been taking Pepcid for this without  Significant relief. 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 This actually dates back to her pregnancy. It got a little bit worse with her chemotherapy but it's essentially at baseline. It does not affect her feet. She exercises by walking a couple of times a week. Aside from some seasonal allergies a detailed review of systems today was otherwise negative.  PAST MEDICAL HISTORY: Past Medical History  Diagnosis Date  . Hypertension     borderline- no meds  . Headache(784.0)     USES OTC MEDS  . GERD (gastroesophageal reflux disease)   . Breast cancer ([place])     right breast  . Maintenance chemotherapy following disease   . Allergy     seasonal /rag weed  . Arthritis     hands feet  . Anxiety   . Cataract     no surgery yet  . Diabetes mellitus     when pregnant only 18 years ago,resolved  . Cancer ([place])   . Depression 05/03/2013    PAST SURGICAL HISTORY: Past Surgical History  Procedure Laterality Date  . Cesarean section    . Portacath placement  08/21/2011    Procedure: INSERTION PORT-A-CATH;  Surgeon: Rachael Lasso, MD;  Location: [facility];  Service: General;  Laterality: Left;  . Breast lumpectomy  08/21/11    right  . Port-a-cath removal N/A 04/13/2013    Procedure: MINOR REMOVAL PORT-A-CATH;  Surgeon: Rachael Weaver  Rachael Bien, MD;  Location: [facility];  Service: General;  Laterality: N/A;    FAMILY HISTORY Family History  Problem Relation Age of Onset  . Diabetes Paternal Grandfather   . Heart disease Paternal Grandfather   . Breast cancer Maternal Grandmother   . Cancer Maternal Grandmother     breast  . Hypertension Father   . Stroke Father   . Breast cancer Maternal Aunt   . Cancer Maternal Aunt     breast  . Cancer Cousin 40    cervical or ovarian   the patient's father died at the age of 7  following multiple strokes in the setting of untreated hypertension. The patient's mother died at the age of 39 with congestive heart failure. The patient's mother's mother was diagnosed with breast cancer in her 26s and one of the patient's mother's 2 sisters, one of the patient's maternal aunts, was diagnosed with breast cancer in her 43s. Rachael Weaver herself has 5 brothers, no sisters. There is no other breast or ovarian cancer in the family to the patient's knowledge.  GYNECOLOGIC HISTORY:   (Updated 03/28/2013) Menarche age 7, first live birth age 89. The patient is GX P1. She went through menopause age 84. She did not take hormones.  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 She is divorced. At home is her adopted daughter Rachael Weaver (originally from [place]) who will soon be 86.  Her son Rachael Weaver is currently living in [place].   ADVANCED DIRECTIVES: Not in place   HEALTH MAINTENANCE: (Updated 03/28/2013) Social History  Substance Use Topics  . Smoking status: Former Smoker -- 1.00 packs/day    Types: Cigarettes    Quit date: 04/29/1971  . Smokeless tobacco: Never Used  . Alcohol Use: 0.0 oz/week    0-1 Glasses of wine per week     Comment: rarely social glass wine     Colonoscopy: Due  PAP: March 2013  Bone density: November 2013, osteopenia  Lipid panel: Not on file  Mammogram: 09/2013   No Known Allergies  Current Outpatient Prescriptions  Medication Sig Dispense Refill  . aspirin 81 MG tablet Take 81 mg by mouth daily.    Marland Kitchen aspirin-acetaminophen-caffeine (EXCEDRIN MIGRAINE) 250-250-65 MG per tablet Take 1 tablet by mouth as needed.    . blood glucose meter kit and supplies Dispense based on patient and insurance preference. Use up to four times daily as directed. (FOR ICD-9 250.00, 250.01). 1 each 0  . citalopram (CELEXA) 10 MG tablet Take 1 tablet (10 mg total) by mouth daily. 90 tablet 3  . cyanocobalamin 100 MCG tablet Take 100 mcg by mouth  daily.    . fexofenadine (ALLEGRA) 30 MG tablet Take 30 mg by mouth as needed.    Marland Kitchen losartan (COZAAR) 50 MG tablet TAKE 1 TABLET (50 MG TOTAL) BY MOUTH DAILY. 90 tablet 3  . Naproxen Sodium (ALEVE PO) Take by mouth.    . tamoxifen (NOLVADEX) 20 MG tablet Take 1 tablet (20 mg total) by mouth daily. 90 tablet 3   No current facility-administered medications for this visit.   Facility-Administered Medications Ordered in Other Visits  Medication Dose Route Frequency Provider Last Rate Last Dose  . sodium chloride 0.9 % injection 10 mL  10 mL Intracatheter PRN Rachael Esters, MD   10 mL at 12/25/11 1430    OBJECTIVE: Middle-aged white woman what appears well Vitals Filed Vitals:   04/18/15 0936  BP: 133/69  Pulse: 83  Temp: 98.5 F (36.9 C)  Resp: 18  Body mass index is 32.69 kg/(m^2). ECOG: 0 Filed Weights   04/18/15 0936  Weight: 184 lb 8 oz (83.689 kg)    Sclerae unicteric, pupils round and equal Oropharynx clear and moist-- no thrush or other lesions No cervical or supraclavicular adenopathy Lungs no rales or rhonchi Heart regular rate and rhythm Abd soft, nontender, positive bowel sounds MSK no focal spinal tenderness, no upper extremity lymphedema Neuro: nonfocal, well oriented, appropriate affect Breasts:  The right breast is status post lumpectomy and radiation. There is no evidence of local recurrence. The right axilla is benign per the left breast is unremarkable.   LAB RESULTS:   Lab Results  Component Value Date   WBC 5.5 04/18/2015   NEUTROABS 2.8 04/18/2015   HGB 13.6 04/18/2015   HCT 41.2 04/18/2015   MCV 93.2 04/18/2015   PLT 161 04/18/2015      Chemistry      Component Value Date/Time   NA 138 04/03/2015 1917   NA 141 10/18/2014 0840   K 3.7 04/03/2015 1917   K 3.8 10/18/2014 0840   CL 104 04/03/2015 1917   CL 105 09/06/2012 0852   CO2 21 04/03/2015 1917   CO2 25 10/18/2014 0840   BUN 14 04/03/2015 1917   BUN 13.3 10/18/2014 0840   CREATININE  0.77 04/03/2015 1917   CREATININE 0.8 10/18/2014 0840   CREATININE 0.83 12/18/2011 1205      Component Value Date/Time   CALCIUM 8.6 04/03/2015 1917   CALCIUM 8.7 10/18/2014 0840   ALKPHOS 87 10/18/2014 0840   ALKPHOS 83 12/18/2011 1205   AST 17 10/18/2014 0840   AST 23 12/18/2011 1205   ALT 14 10/18/2014 0840   ALT 31 12/18/2011 1205   BILITOT 0.51 10/18/2014 0840   BILITOT 0.5 12/18/2011 1205      STUDIES: Mm Diag Breast Tomo Bilateral  04/11/2015  CLINICAL DATA:  History of treated right breast cancer, status post lumpectomy, chemo and radiation therapy in 2013. Patient is asymptomatic. EXAM: DIGITAL DIAGNOSTIC BILATERAL MAMMOGRAM WITH 3D TOMOSYNTHESIS AND CAD COMPARISON:  Previous exam(s). ACR Breast Density Category b: There are scattered areas of fibroglandular density. FINDINGS: There are no suspicious masses, areas of nonsurgical architectural distortion or microcalcifications in either breast. Stable post surgical and post treatment changes in the right breast are noted. Mammographic images were processed with CAD. IMPRESSION: No mammographic evidence of malignancy in either breast, status post right lumpectomy. RECOMMENDATION: Diagnostic mammogram is suggested in 1 year. (Code:DM-B-01Y) I have discussed the findings and recommendations with the patient. Results were also provided in writing at the conclusion of the visit. If applicable, a reminder letter will be sent to the patient regarding the next appointment. BI-RADS CATEGORY  2: Benign. Electronically Signed   By: Fidela Salisbury M.D.   On: 04/11/2015 11:23      ASSESSMENT: 64 y.o. [place] woman  (1) status post right lumpectomy and sentinel lymph node sampling 08/21/2011 for a pT2 pN0, stage IIA invasive ductal carcinoma, grade 3, estrogen receptor 100% and progesterone receptor 59% positive, with an MIB-1 of 66% and HER-2 amplification by CISH with a ratio of 2.82  (2) treated adjuvantly with carboplatin,  docetaxel and trastuzumab x6 completed 12/18/2011  (3) completed adjuvant radiation 02/24/2012  (4) completed 1 year of trastuzumab 09/06/2012; echo 08/23/2012 shows a well-preserved ejection fraction  (5) started anastrozole September of 2013, held December 2014 for drug holiday, restarted January 2014. Stopped again February  2016. Tamoxifen started March 2016.   (6)  DEXA scan 03/11/2012 showed minimal osteopenia (T score -1.2) ; [facility] 2014 showed a T score of -1.6  PLAN:  Mariette is  Now 3 NA half years out from her definitive surgery with no evidence of disease recurrence. This is very favorable.  She is tolerating the tamoxifen well and the plan will be to continue that through September 2018.    she is behind on her health maintenance and I am putting in a referral to gastroenterology 2 try to get her a colonoscopy sometime in 2017.   she will return to see [place] again in January 2018, after her next mammogram. She knows to call for any problems that may develop before her next visit here.  Chauncey Cruel, MD 04/18/2015 9:42 AM

## 2015-05-18 ENCOUNTER — Encounter: Payer: Self-pay | Admitting: Family Medicine

## 2015-05-23 ENCOUNTER — Encounter: Payer: Self-pay | Admitting: Family Medicine

## 2015-12-26 ENCOUNTER — Other Ambulatory Visit: Payer: Self-pay | Admitting: Gastroenterology

## 2016-01-22 ENCOUNTER — Other Ambulatory Visit: Payer: Self-pay | Admitting: Gastroenterology

## 2016-01-23 ENCOUNTER — Encounter (HOSPITAL_COMMUNITY): Payer: Self-pay | Admitting: *Deleted

## 2016-01-24 ENCOUNTER — Encounter (HOSPITAL_COMMUNITY): Payer: Self-pay | Admitting: *Deleted

## 2016-01-29 ENCOUNTER — Ambulatory Visit (HOSPITAL_COMMUNITY): Payer: Self-pay | Admitting: Anesthesiology

## 2016-01-29 ENCOUNTER — Encounter (HOSPITAL_COMMUNITY): Payer: Self-pay | Admitting: Registered Nurse

## 2016-01-29 ENCOUNTER — Ambulatory Visit (HOSPITAL_COMMUNITY)
Admission: RE | Admit: 2016-01-29 | Discharge: 2016-01-29 | Disposition: A | Discharge status: Home / Self Care | Payer: Self-pay | Source: Ambulatory Visit | Attending: Gastroenterology | Admitting: Gastroenterology

## 2016-01-29 ENCOUNTER — Encounter (HOSPITAL_COMMUNITY)
Admission: RE | Disposition: A | Discharge status: Home / Self Care | Payer: Self-pay | Source: Ambulatory Visit | Attending: Gastroenterology

## 2016-01-29 DIAGNOSIS — Z87891 Personal history of nicotine dependence: Secondary | ICD-10-CM | POA: Insufficient documentation

## 2016-01-29 DIAGNOSIS — Z8371 Family history of colonic polyps: Secondary | ICD-10-CM | POA: Insufficient documentation

## 2016-01-29 DIAGNOSIS — F418 Other specified anxiety disorders: Secondary | ICD-10-CM | POA: Insufficient documentation

## 2016-01-29 DIAGNOSIS — Z853 Personal history of malignant neoplasm of breast: Secondary | ICD-10-CM | POA: Insufficient documentation

## 2016-01-29 DIAGNOSIS — Z6832 Body mass index (BMI) 32.0-32.9, adult: Secondary | ICD-10-CM | POA: Insufficient documentation

## 2016-01-29 DIAGNOSIS — E669 Obesity, unspecified: Secondary | ICD-10-CM | POA: Insufficient documentation

## 2016-01-29 DIAGNOSIS — I10 Essential (primary) hypertension: Secondary | ICD-10-CM | POA: Insufficient documentation

## 2016-01-29 DIAGNOSIS — K5730 Diverticulosis of large intestine without perforation or abscess without bleeding: Secondary | ICD-10-CM | POA: Insufficient documentation

## 2016-01-29 DIAGNOSIS — Z9221 Personal history of antineoplastic chemotherapy: Secondary | ICD-10-CM | POA: Insufficient documentation

## 2016-01-29 DIAGNOSIS — Z1211 Encounter for screening for malignant neoplasm of colon: Secondary | ICD-10-CM | POA: Insufficient documentation

## 2016-01-29 DIAGNOSIS — K219 Gastro-esophageal reflux disease without esophagitis: Secondary | ICD-10-CM | POA: Insufficient documentation

## 2016-01-29 DIAGNOSIS — Z79899 Other long term (current) drug therapy: Secondary | ICD-10-CM | POA: Insufficient documentation

## 2016-01-29 HISTORY — PX: COLONOSCOPY WITH PROPOFOL: SHX5780

## 2016-01-29 HISTORY — DX: Other seasonal allergic rhinitis: J30.2

## 2016-01-29 HISTORY — DX: Lymphedema, not elsewhere classified: I89.0

## 2016-01-29 SURGERY — COLONOSCOPY WITH PROPOFOL
Anesthesia: Monitor Anesthesia Care

## 2016-01-29 MED ORDER — PROMETHAZINE HCL 25 MG/ML IJ SOLN: 6.2500 mg | INTRAMUSCULAR | Status: DC | PRN

## 2016-01-29 MED ORDER — PROPOFOL 10 MG/ML IV BOLUS
INTRAVENOUS | Status: AC
  Filled 2016-01-29: qty 20

## 2016-01-29 MED ORDER — LACTATED RINGERS IV SOLN
INTRAVENOUS | Status: DC
  Administered 2016-01-29: 11:00:00 via INTRAVENOUS

## 2016-01-29 MED ORDER — FENTANYL CITRATE (PF) 100 MCG/2ML IJ SOLN: 25.0000 ug | INTRAMUSCULAR | Status: DC | PRN

## 2016-01-29 MED ORDER — LABETALOL HCL 5 MG/ML IV SOLN
INTRAVENOUS | Status: DC | PRN
  Administered 2016-01-29: 7.5 mg via INTRAVENOUS

## 2016-01-29 MED ORDER — PHENYLEPHRINE HCL 10 MG/ML IJ SOLN: INTRAMUSCULAR | Status: DC | PRN

## 2016-01-29 MED ORDER — PROPOFOL 500 MG/50ML IV EMUL
INTRAVENOUS | Status: DC | PRN
  Administered 2016-01-29: 300 ug/kg/min via INTRAVENOUS

## 2016-01-29 MED ORDER — GLYCOPYRROLATE 0.2 MG/ML IJ SOLN
INTRAMUSCULAR | Status: DC | PRN
  Administered 2016-01-29: 0.2 mg via INTRAVENOUS

## 2016-01-29 MED ORDER — LABETALOL HCL 5 MG/ML IV SOLN
INTRAVENOUS | Status: AC
  Filled 2016-01-29: qty 4

## 2016-01-29 MED ORDER — GLYCOPYRROLATE 0.2 MG/ML IV SOSY
PREFILLED_SYRINGE | INTRAVENOUS | Status: AC
  Filled 2016-01-29: qty 3

## 2016-01-29 MED ORDER — SODIUM CHLORIDE 0.9 % IV SOLN: INTRAVENOUS | Status: DC

## 2016-01-29 MED ORDER — LIDOCAINE 2% (20 MG/ML) 5 ML SYRINGE
INTRAMUSCULAR | Status: AC
  Filled 2016-01-29: qty 5

## 2016-01-29 MED ORDER — LACTATED RINGERS IV SOLN
INTRAVENOUS | Status: DC | PRN
  Administered 2016-01-29: 11:00:00 via INTRAVENOUS

## 2016-01-29 MED ORDER — LIDOCAINE HCL (CARDIAC) 20 MG/ML IV SOLN
INTRAVENOUS | Status: DC | PRN
  Administered 2016-01-29: 100 mg via INTRAVENOUS

## 2016-01-29 SURGICAL SUPPLY — 21 items

## 2016-01-29 NOTE — Anesthesia Postprocedure Evaluation (Signed)
Anesthesia Post Note  Patient: Rachael Weaver  Procedure(s) Performed: Procedure(s) (LRB): COLONOSCOPY WITH PROPOFOL (N/A)  Patient location during evaluation: PACU Anesthesia Type: MAC Level of consciousness: awake and alert Pain management: pain level controlled Vital Signs Assessment: post-procedure vital signs reviewed and stable Respiratory status: spontaneous breathing, nonlabored ventilation and respiratory function stable Cardiovascular status: stable and blood pressure returned to baseline Anesthetic complications: no    Last Vitals:  Vitals:   01/29/16 1140 01/29/16 1150  BP: 134/78 128/90  Pulse: 89 85  Resp: 14 15  Temp:      Last Pain:  Vitals:   01/29/16 1024  TempSrc: Oral                 Nilda Simmer

## 2016-01-29 NOTE — Discharge Instructions (Signed)

## 2016-01-29 NOTE — Transfer of Care (Signed)
Immediate Anesthesia Transfer of Care Note  Patient: Rachael Weaver  Procedure(s) Performed: Procedure(s): COLONOSCOPY WITH PROPOFOL (N/A)  Patient Location: PACU  Anesthesia Type:MAC  Level of Consciousness: awake, alert , oriented and patient cooperative  Airway & Oxygen Therapy: Patient Spontanous Breathing and Patient connected to face mask oxygen  Post-op Assessment: Report given to RN, Post -op Vital signs reviewed and stable and Patient moving all extremities X 4  Post vital signs: stable  Last Vitals:  Vitals:   01/29/16 1132 01/29/16 1140  BP: (!) 114/55 134/78  Pulse: 89 89  Resp: 19 14  Temp:      Last Pain:  Vitals:   01/29/16 1024  TempSrc: Oral         Complications: No apparent anesthesia complications

## 2016-01-29 NOTE — Anesthesia Preprocedure Evaluation (Addendum)
Anesthesia Evaluation  Patient identified by MRN, date of birth, ID band Patient awake    Reviewed: Allergy & Precautions, NPO status , Patient's Chart, lab work & pertinent test results  History of Anesthesia Complications Negative for: history of anesthetic complications  Airway Mallampati: II  TM Distance: >3 FB Neck ROM: Full    Dental  (+) Dental Advisory Given   Pulmonary neg shortness of breath, neg sleep apnea, neg COPD, neg recent URI, former smoker,    Pulmonary exam normal breath sounds clear to auscultation       Cardiovascular hypertension, Pt. on medications (-) angina(-) Past MI, (-) Cardiac Stents and (-) CABG  Rhythm:Regular Rate:Normal  TTE 08/23/2012: Study Conclusions  Left ventricle: The cavity size was normal. There was mild focal basal hypertrophy of the septum. Systolic function was normal. The estimated ejection fraction was in the range of 55% to 60%. Wall motion was normal; there were no regional wall motion abnormalities. Doppler parameters are consistent with abnormal left ventricular relaxation (grade 1 diastolic dysfunction).     Neuro/Psych  Headaches, PSYCHIATRIC DISORDERS Anxiety Depression    GI/Hepatic Neg liver ROS, Bowel prep,GERD  Medicated,  Endo/Other  negative endocrine ROS  Renal/GU negative Renal ROS     Musculoskeletal  (+) Arthritis ,   Abdominal (+) + obese,   Peds  Hematology negative hematology ROS (+)   Anesthesia Other Findings H/o right breast cancer s/p surgery, chemo, and radiation  Reproductive/Obstetrics                            Anesthesia Physical Anesthesia Plan  ASA: III  Anesthesia Plan: MAC   Post-op Pain Management:    Induction: Intravenous  Airway Management Planned: Natural Airway and Nasal Cannula  Additional Equipment:   Intra-op Plan:   Post-operative Plan:   Informed Consent: I have reviewed the  patients History and Physical, chart, labs and discussed the procedure including the risks, benefits and alternatives for the proposed anesthesia with the patient or authorized representative who has indicated his/her understanding and acceptance.   Dental advisory given  Plan Discussed with: CRNA  Anesthesia Plan Comments:        Anesthesia Quick Evaluation

## 2016-01-29 NOTE — H&P (Signed)
Procedure: Screening colonoscopy.  History: The patient is a 65 year old female born January 03, 1951. She is scheduled to undergo a screening colonoscopy today. This no family history of colon cancer. Her mother and sister have had benign polyps removed in the past. She tells me she underwent her first colonoscopy over 15 years ago.  Past medical history: Breast cancer. Right breast lumpectomy. Cesarean section. Hypertension. Gastroesophageal reflux. Ragweed allergy. Chronic anxiety with depression. Cataracts. Former cigarette smoker.  Exam: The patient is alert and lying comfortably on the endoscopy stretcher. Abdomen is soft and nontender to palpation. Lungs are clear to auscultation. Cardiac exam reveals a regular rhythm.  Plan: Proceed with screening colonoscopy

## 2016-01-29 NOTE — Op Note (Signed)
[facility] Patient Name: Rachael Weaver Procedure Date: 01/29/2016 MRN: [id number] Attending MD: Garlan Fair , MD Date of Birth: May 06, 1950 CSN: [id number] Age: 65 Admit Type: Outpatient Procedure:                Colonoscopy Indications:              Screening for colorectal malignant neoplasm Providers:                Garlan Fair, MD, Hilma Favors, RN, William Dalton, Technician Referring MD:              Medicines:                Propofol per Anesthesia Complications:            No immediate complications. Estimated Blood Loss:     Estimated blood loss: none. Procedure:                Pre-Anesthesia Assessment:                           - Prior to the procedure, a History and Physical                            was performed, and patient medications and                            allergies were reviewed. The patient's tolerance of                            previous anesthesia was also reviewed. The risks                            and benefits of the procedure and the sedation                            options and risks were discussed with the patient.                            All questions were answered, and informed consent                            was obtained. Prior Anticoagulants: The patient has                            taken aspirin, last dose was 1 day prior to                            procedure. ASA Grade Assessment: II - A patient                            with mild systemic disease. After reviewing the  risks and benefits, the patient was deemed in                            satisfactory condition to undergo the procedure.                           After obtaining informed consent, the colonoscope                            was passed under direct vision. Throughout the                            procedure, the patient's blood pressure, pulse, and                            oxygen  saturations were monitored continuously. The                            [id number] scope was introduced through                            the anus and advanced to the the cecum, identified                            by appendiceal orifice and ileocecal valve. The                            colonoscopy was technically very difficult and                            complex due to significant looping in the sigmoid                            colon associated with colonic diverticulosis. The                            ultrathin colonoscope was required to perform the                            exam. The patient tolerated the procedure well. The                            quality of the bowel preparation was good. The                            appendiceal orifice and the rectum were                            photographed. Findings:      The perianal and digital rectal examinations were normal.      The entire examined colon appeared normal. Left colonic diverticulosis       was present. Impression:               - The entire examined colon  is normal.                           - No specimens collected. Moderate Sedation:      N/A- Per Anesthesia Care Recommendation:           - Patient has a contact number available for                            emergencies. The signs and symptoms of potential                            delayed complications were discussed with the                            patient. Return to normal activities tomorrow.                            Written discharge instructions were provided to the                            patient.                           - Repeat colonoscopy in 10 years for screening                            purposes.                           - Resume previous diet.                           - Continue present medications. Procedure Code(s):        --- Professional ---                           [id number], Colorectal cancer screening;  colonoscopy on                            individual not meeting criteria for high risk Diagnosis Code(s):        --- Professional ---                           Z12.11, Encounter for screening for malignant                            neoplasm of colon CPT copyright 2016 American Medical Association. All rights reserved. The codes documented in this report are preliminary and upon coder review may  be revised to meet current compliance requirements. Earle Gell, MD Garlan Fair, MD 01/29/2016 11:29:10 AM This report has been signed electronically. Number of Addenda: 0

## 2016-01-30 ENCOUNTER — Encounter (HOSPITAL_COMMUNITY): Payer: Self-pay | Admitting: Gastroenterology

## 2016-02-12 ENCOUNTER — Other Ambulatory Visit: Payer: Self-pay | Admitting: Oncology

## 2016-02-12 DIAGNOSIS — Z853 Personal history of malignant neoplasm of breast: Secondary | ICD-10-CM

## 2016-02-12 DIAGNOSIS — F329 Major depressive disorder, single episode, unspecified: Secondary | ICD-10-CM

## 2016-02-12 DIAGNOSIS — F32A Depression, unspecified: Secondary | ICD-10-CM

## 2016-04-11 ENCOUNTER — Ambulatory Visit
Admission: RE | Admit: 2016-04-11 | Discharge: 2016-04-11 | Disposition: A | Discharge status: Home / Self Care | Payer: Self-pay | Source: Ambulatory Visit | Attending: Oncology | Admitting: Oncology

## 2016-04-11 DIAGNOSIS — Z853 Personal history of malignant neoplasm of breast: Secondary | ICD-10-CM

## 2016-04-24 ENCOUNTER — Other Ambulatory Visit: Payer: Self-pay | Admitting: Nurse Practitioner

## 2016-05-03 ENCOUNTER — Other Ambulatory Visit: Payer: Self-pay | Admitting: Oncology

## 2016-05-13 ENCOUNTER — Encounter: Payer: Self-pay | Admitting: Physician Assistant

## 2016-05-13 ENCOUNTER — Ambulatory Visit (INDEPENDENT_AMBULATORY_CARE_PROVIDER_SITE_OTHER): Payer: Self-pay | Admitting: Physician Assistant

## 2016-05-13 VITALS — BP 126/80 | HR 121 | Temp 101.0°F | Resp 17 | Ht 63.0 in | Wt 177.0 lb

## 2016-05-13 DIAGNOSIS — R059 Cough, unspecified: Secondary | ICD-10-CM

## 2016-05-13 DIAGNOSIS — R05 Cough: Secondary | ICD-10-CM

## 2016-05-13 DIAGNOSIS — I10 Essential (primary) hypertension: Secondary | ICD-10-CM

## 2016-05-13 MED ORDER — HYDROCODONE-HOMATROPINE 5-1.5 MG/5ML PO SYRP: 5.0000 mL | ORAL_SOLUTION | Freq: Three times a day (TID) | ORAL | 0 refills | Status: DC | PRN

## 2016-05-13 MED ORDER — LOSARTAN POTASSIUM 50 MG PO TABS: ORAL_TABLET | ORAL | 3 refills | Status: DC

## 2016-05-13 NOTE — Patient Instructions (Addendum)
  Please push fluids.  Tylenol and Motrin for fever and body aches.    A humidifier can help especially when the air is dry -if you do not have a humidifier you can boil a pot of water on the stove in your home to help with the dry air.  Nasal saline spray can be helpful to keep the mucus membranes moist and thin the nasal mucus    IF you received an x-ray today, you will receive an invoice from [facility]. Please contact [facility] at [phone] with questions or concerns regarding your invoice.   IF you received labwork today, you will receive an invoice from [facility]. Please contact [facility] at [phone] with questions or concerns regarding your invoice.   Our billing staff will not be able to assist you with questions regarding bills from these companies.  You will be contacted with the lab results as soon as they are available. The fastest way to get your results is to activate your My Chart account. Instructions are located on the last page of this paperwork. If you have not heard from us regarding the results in 2 weeks, please contact this office.      

## 2016-05-13 NOTE — Progress Notes (Signed)
Rachael Weaver  MRN: [id number] DOB: 08-01-1950  Subjective:  Pt presents to clinic with cold symptoms that started yesterday.  Started with a tickle in her throat yesterday and and then last night she started to feel terrible -  Like "he was hit with a ton of bricks".  She is coughing which disrupted her sleep last night and fevers this am.  She is having some mild nasal congestion and some ear pressure/pain.    nyquil last night for the cough last night No flu vaccine  She also needs refills of her HTN meds - she has an appt with oncology this month and she knows she will have lab work at that visit.  Review of Systems  Constitutional: Positive for chills and fever.  HENT: Positive for congestion, postnasal drip, rhinorrhea and sore throat.   Respiratory: Positive for cough.   Gastrointestinal: Negative.     Patient Active Problem List   Diagnosis Date Noted  . Hot flashes 10/18/2014  . Neuropathy ([place]) 06/09/2014  . Depression 05/03/2013  . HTN (hypertension) 08/23/2012  . Allergy   . Breast cancer ([place]) 08/26/2011  . Breast cancer of upper-outer quadrant of right female breast ([place]) 07/24/2011  . Allergy history unknown 07/10/2011    Current Outpatient Prescriptions on File Prior to Visit  Medication Sig Dispense Refill  . aspirin-acetaminophen-caffeine (EXCEDRIN MIGRAINE) 250-250-65 MG per tablet Take 1 tablet by mouth as needed.    . citalopram (CELEXA) 10 MG tablet TAKE 1 TABLET (10 MG TOTAL) BY MOUTH DAILY. 90 tablet 3  . fexofenadine (ALLEGRA) 30 MG tablet Take 30 mg by mouth as needed.    . lansoprazole (PREVACID) 15 MG capsule Take 15 mg by mouth daily at 12 noon. As needed for acid reflux    . Naproxen Sodium (ALEVE PO) Take by mouth.    . tamoxifen (NOLVADEX) 20 MG tablet TAKE 1 TABLET (20 MG TOTAL) BY MOUTH DAILY. 90 tablet 0   Current Facility-Administered Medications on File Prior to Visit  Medication Dose Route Frequency Provider Last Rate Last Dose  .  sodium chloride 0.9 % injection 10 mL  10 mL Intracatheter PRN Eston Esters, MD   10 mL at 12/25/11 1430    No Known Allergies  Pt patients past, family and social history were reviewed and updated.   Objective:  BP 126/80 (BP Location: Right Arm, Patient Position: Sitting, Cuff Size: Normal)   Pulse (!) 121   Temp (!) 101 F (38.3 C) (Oral)   Resp 17   Ht 5\' 3"  (1.6 m)   Wt 177 lb (80.3 kg)   SpO2 95%   BMI 31.35 kg/m   Physical Exam  Constitutional: She is oriented to person, place, and time and well-developed, well-nourished, and in no distress.  HENT:  Head: Normocephalic and atraumatic.  Right Ear: Hearing, tympanic membrane, external ear and ear canal normal.  Left Ear: Hearing, tympanic membrane, external ear and ear canal normal.  Nose: Mucosal edema (red) present.  Mouth/Throat: Uvula is midline, oropharynx is clear and moist and mucous membranes are normal.  Eyes: Conjunctivae are normal.  Neck: Normal range of motion.  Cardiovascular: Normal rate, regular rhythm and normal heart sounds.   No murmur heard. Pulmonary/Chest: Effort normal and breath sounds normal.  Neurological: She is alert and oriented to person, place, and time. Gait normal.  Skin: Skin is warm and dry.  Psychiatric: Mood, memory, affect and judgment normal.  Vitals reviewed.   Assessment and Plan :  Cough - Plan: HYDROcodone-homatropine (HYCODAN) 5-1.5 MG/5ML syrup - likely flu - d/w pt tamiflu and she declines that at this visit - she will continue cold symptomatic treatment at home  Essential hypertension - Plan: losartan (COZAAR) 50 MG tablet - we will continue her medication  She needs a pap and plans to make a appt for that to be done in the upcoming months.  Windell Hummingbird PA-C  [facility] 05/13/2016 10:53 AM

## 2016-08-16 ENCOUNTER — Other Ambulatory Visit: Payer: Self-pay | Admitting: Oncology

## 2016-08-18 ENCOUNTER — Telehealth: Payer: Self-pay | Admitting: Oncology

## 2016-08-18 ENCOUNTER — Telehealth: Payer: Self-pay

## 2016-08-18 NOTE — Telephone Encounter (Signed)
Returned call to pt to confirm 5/3 appt at 10 am per LOS

## 2016-08-18 NOTE — Telephone Encounter (Signed)
Per Dr Jana Hakim, Tamoxifen refilled for 1 month.  Msg sent to scheduling for pt to see Magrinat within 1 month.  Pt made aware.

## 2016-08-28 ENCOUNTER — Encounter: Payer: Self-pay | Admitting: Oncology

## 2016-08-28 ENCOUNTER — Ambulatory Visit (HOSPITAL_BASED_OUTPATIENT_CLINIC_OR_DEPARTMENT_OTHER): Payer: Self-pay | Admitting: Oncology

## 2016-08-28 VITALS — BP 142/75 | HR 85 | Temp 98.7°F | Resp 18 | Ht 63.0 in | Wt 179.5 lb

## 2016-08-28 DIAGNOSIS — M8580 Other specified disorders of bone density and structure, unspecified site: Secondary | ICD-10-CM

## 2016-08-28 DIAGNOSIS — Z170 Estrogen receptor positive status [ER+]: Secondary | ICD-10-CM

## 2016-08-28 DIAGNOSIS — Z79811 Long term (current) use of aromatase inhibitors: Secondary | ICD-10-CM

## 2016-08-28 DIAGNOSIS — C50411 Malignant neoplasm of upper-outer quadrant of right female breast: Secondary | ICD-10-CM

## 2016-08-28 DIAGNOSIS — C50911 Malignant neoplasm of unspecified site of right female breast: Secondary | ICD-10-CM

## 2016-08-28 NOTE — Progress Notes (Signed)
ID: Rachael Weaver OB: [birth year]  MR#: [id number]  RCV#:[id number]  PCP: Rachael Blinks, MD GYN:   SUOsborn Weaver OTHER MD: Rachael Weaver  CHIEF COMPLAINT:  Right Breast Cancer  CURRENT TREATMENT: Completing 5 years of anti-estrogens   BREAT CANCER HISTORY:  From Rachael Weaver's intake note 05/03/2012:  "Patient initially presented with increasing tenderness in her breasts. The pain seemed to be worse on the right which prompted a mammogram. The mammogram showed a 2.9 x 2.8 x 1.5 cm mass. 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 She was also noted to have an enlarged right axillary lymph node measuring 1.5 cm. She met with Rachael Weaver who did recommend  neoadjuvant chemotherapy. However patient opted for a lumpectomy up front.  and on 08/21/2011 patient had a right breast lumpectomy with sentinel lymph node biopsy. The final pathology revealed a grade 3 invasive ductal carcinoma measuring 3.1 cm with associated high-grade ductal carcinoma in situ all margins were negative. Three sentinel nodes were negative for metastatic disease. The tumor was estrogen receptor +100% per just her receptor-positive 59% HER-2/neu was amplified with a ratio of 2.8 to Ki-67 was 66%."  Her subsequent history is as detailed below.  INTERVAL HISTORY: Rachael Weaver returns today for follow-up of her estrogen receptor positive breast cancer. She continues on tamoxifen, with good tolerance. She does not have significant problems with hot flashes or vaginal wetness. She obtains a drug at a good price.   REVIEW OF SYSTEMS: Rachael Weaver took care of her reflux by changing her diet, but unfortunately then return to her prior diet is so she has the same problem at present. She has had some rectal bleeding and this is being worked up by GI. She has some diverticular  disease. Aside from these issues she is doing "pretty good" and she is walking for exercise. A detailed review of systems today was otherwise stable  PAST MEDICAL HISTORY: Past Medical History:  Diagnosis Date  . Allergy    seasonal /rag weed  . Anxiety   . Arthritis    hands feet- "aching all over from Tamoxifen use.  . Breast cancer [facility])    right breast-surgery 2013, chemo, radiation 11'13 completed.- Mr Rachael Weaver LOV [phone]  . Cancer ([place])   . Cataract    no surgery yet  . Depression 05/03/2013  . GERD (gastroesophageal reflux disease)   . Headache(784.0)    USES OTC MEDS- no recent problems  . Hypertension    borderline- no meds  . Lymph edema    intermittent right arm- presently okay 01-24-16  . Maintenance chemotherapy following disease   . Seasonal allergies    uses OTC as needed    PAST SURGICAL HISTORY: Past Surgical History:  Procedure Laterality Date  . BREAST LUMPECTOMY  08/21/11   right  . CESAREAN SECTION    . COLONOSCOPY WITH PROPOFOL N/A 01/29/2016   Procedure: COLONOSCOPY WITH PROPOFOL;  Surgeon: Rachael Fair, MD;  Location: [facility];  Service: Endoscopy;  Laterality: N/A;  . PORT-A-CATH REMOVAL N/A 04/13/2013   Procedure: MINOR REMOVAL PORT-A-CATH;  Surgeon: Rachael Lasso, MD;  Location: [place];  Service: General;  Laterality: N/A;  . PORTACATH PLACEMENT  08/21/2011   Procedure: INSERTION PORT-A-CATH;  Surgeon: Rachael Lasso, MD;  Location: [facility];  Service: General;  Laterality: Left;    FAMILY HISTORY Family History  Problem Relation Age of Onset  . Diabetes Paternal Grandfather   . Heart disease Paternal Grandfather   . Hypertension Father   . Stroke Father   . Cancer Cousin 40    cervical or ovarian  . Breast cancer Maternal Grandmother   . Cancer Maternal Grandmother     breast  . Breast cancer Maternal Aunt   . Cancer Maternal Aunt     breast   the patient's father died at the age of 53  following multiple strokes in the setting of untreated hypertension. The patient's mother died at the age of 46 with congestive heart failure. The patient's mother's mother was diagnosed with breast cancer in her 51s and one of the patient's mother's 2 sisters, one of the patient's maternal aunts, was diagnosed with breast cancer in her 55s. Rachael Weaver herself has 5 brothers, no sisters. There is no other breast or ovarian cancer in the family to the patient's knowledge.  GYNECOLOGIC HISTORY:   (Updated 03/28/2013) Menarche age 30, first live birth age 35. The patient is GX P1. She went through menopause age 3. She did not take hormones.  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 She is divorced. At home is her adopted daughter Rachael Weaver (originally from [place]) who will soon be 23.  Her son Rachael Weaver is currently living in [place].   ADVANCED DIRECTIVES: Not in place   HEALTH MAINTENANCE: (Updated 03/28/2013) Social History  Substance Use Topics  . Smoking status: Former Smoker    Packs/day: 1.00    Types: Cigarettes    Quit date: 04/29/1971  . Smokeless tobacco: Never Used  . Alcohol use 0.0 oz/week     Comment: rarely social glass wine     Colonoscopy: Due  PAP: March 2013  Bone density: November 2013, osteopenia  Lipid panel: Not on file  Mammogram: 09/2013   No Known Allergies  Current Outpatient Prescriptions  Medication Sig Dispense Refill  . aspirin-acetaminophen-caffeine (EXCEDRIN MIGRAINE) 250-250-65 MG per tablet Take 1 tablet by mouth as needed.    . citalopram (CELEXA) 10 MG tablet TAKE 1 TABLET (10 MG TOTAL) BY MOUTH DAILY. 90 tablet 3  . fexofenadine (ALLEGRA) 30 MG tablet Take 30 mg by mouth as needed.    Marland Kitchen HYDROcodone-homatropine (HYCODAN) 5-1.5 MG/5ML syrup Take 5 mLs by mouth every 8 (eight) hours as needed for cough. 120 mL 0  . lansoprazole (PREVACID) 15 MG capsule Take 15 mg by mouth daily at 12 noon. As needed for acid reflux    .  losartan (COZAAR) 50 MG tablet TAKE 1 TABLET (50 MG TOTAL) BY MOUTH DAILY. 90 tablet 3  . Naproxen Sodium (ALEVE PO) Take by mouth.    . tamoxifen (NOLVADEX) 20 MG tablet TAKE 1 TABLET (20 MG TOTAL) BY MOUTH DAILY. 30 tablet 0   No current facility-administered medications for this visit.    Facility-Administered Medications Ordered in Other Visits  Medication Dose Route Frequency Provider Last Rate Last Dose  . sodium chloride 0.9 % injection 10 mL  10 mL Intracatheter PRN Eston Esters, MD   10 mL at 12/25/11 1430    OBJECTIVE: Middle-aged white womanIn no acute distress Vitals Vitals:   08/28/16 1020  BP: (!) 142/75  Pulse: 85  Resp: 18  Temp: 98.7 F (37.1 C)  Body mass index is 31.8 kg/m. ECOG: 0 Filed Weights   08/28/16 1020  Weight: 179 lb 8 oz (81.4 kg)    Sclerae unicteric, EOMs intact Oropharynx clear and moist No cervical or supraclavicular adenopathy Lungs no rales or rhonchi Heart regular rate and rhythm Abd soft, nontender, positive bowel sounds MSK no focal spinal tenderness, no upper extremity lymphedema Neuro: nonfocal, well oriented, appropriate affect Breasts: I do not detect any suspicious finding in either breast. Both axillae are benign.   LAB RESULTS:   Lab Results  Component Value Date   WBC 5.5 04/18/2015   NEUTROABS 2.8 04/18/2015   HGB 13.6 04/18/2015   HCT 41.2 04/18/2015   MCV 93.2 04/18/2015   PLT 161 04/18/2015      Chemistry      Component Value Date/Time   NA 141 04/18/2015 0909   K 4.1 04/18/2015 0909   CL 104 04/03/2015 1917   CL 105 09/06/2012 0852   CO2 24 04/18/2015 0909   BUN 14.8 04/18/2015 0909   CREATININE 0.9 04/18/2015 0909      Component Value Date/Time   CALCIUM 9.1 04/18/2015 0909   ALKPHOS 77 04/18/2015 0909   AST 20 04/18/2015 0909   ALT 13 04/18/2015 0909   BILITOT 0.52 04/18/2015 0909      STUDIES: Mammography at the [facility] December 2017 found the breast density to be category B. There was  no evidence of malignancy.   ASSESSMENT: 66 y.o. [place] woman  (1) status post right lumpectomy and sentinel lymph node sampling 08/21/2011 for a pT2 pN0, stage IIA invasive ductal carcinoma, grade 3, estrogen receptor 100% and progesterone receptor 59% positive, with an MIB-1 of 66% and HER-2 amplification by CISH with a ratio of 2.82  (2) treated adjuvantly with carboplatin, docetaxel and trastuzumab x6 completed 12/18/2011  (3) completed adjuvant radiation 02/24/2012  (4) completed 1 year of trastuzumab 09/06/2012; echo 08/23/2012 shows a well-preserved ejection fraction  (5) started anastrozole September of 2013, held December 2014 for drug holiday, restarted January 2014. Stopped again February 2016. Tamoxifen started March 2016.   (6)  DEXA scan 03/11/2012 showed minimal osteopenia (T score -1.2) ; [facility] 2014 showed a T score of -1.6  PLAN:  Azharia Is now 5 years out from definitive surgery for her breast cancer with no evidence of disease recurrence. This is very favorable.  She will complete her tamoxifen early August. If she wanted to be totally compulsive she would continue through September but she is going on the [place] with her son that month, they're going to be walking at 550 miles, (or perhaps its 550 km) and she would like to feel good during that trip.  At this point I feel comfortable releasing her from follow-up. She will need yearly mammography and a yearly physician breast exam for breast cancer follow-up.  I will be glad to see [place] at any point in the future if on when the need arises, but as of now are making no further routine appointment for her here.   Chauncey Cruel, MD 08/28/2016 10:30 AM

## 2016-09-01 ENCOUNTER — Encounter: Payer: Self-pay | Admitting: Obstetrics & Gynecology

## 2016-09-01 ENCOUNTER — Ambulatory Visit (INDEPENDENT_AMBULATORY_CARE_PROVIDER_SITE_OTHER): Payer: Self-pay | Admitting: Obstetrics & Gynecology

## 2016-09-01 ENCOUNTER — Other Ambulatory Visit (HOSPITAL_COMMUNITY)
Admission: RE | Admit: 2016-09-01 | Discharge: 2016-09-01 | Disposition: A | Discharge status: Home / Self Care | Payer: Self-pay | Source: Ambulatory Visit | Attending: Obstetrics & Gynecology | Admitting: Obstetrics & Gynecology

## 2016-09-01 VITALS — BP 110/70 | HR 96 | Resp 16 | Ht 62.25 in | Wt 178.0 lb

## 2016-09-01 DIAGNOSIS — Z9189 Other specified personal risk factors, not elsewhere classified: Secondary | ICD-10-CM

## 2016-09-01 DIAGNOSIS — Z124 Encounter for screening for malignant neoplasm of cervix: Secondary | ICD-10-CM | POA: Insufficient documentation

## 2016-09-01 DIAGNOSIS — Z01419 Encounter for gynecological examination (general) (routine) without abnormal findings: Secondary | ICD-10-CM

## 2016-09-01 DIAGNOSIS — Z0000 Encounter for general adult medical examination without abnormal findings: Secondary | ICD-10-CM

## 2016-09-01 DIAGNOSIS — Z205 Contact with and (suspected) exposure to viral hepatitis: Secondary | ICD-10-CM

## 2016-09-01 NOTE — Progress Notes (Signed)
66 y.o. [id number] DivorcedCaucasianF here for new patient annual exam/new patient exam.    Having recurrent rectal bleeding issues.  She did have a colonoscopy in 10/17.  No polyps.  Bleeding is still present.    Denies vaginal bleeding.    Patient's last menstrual period was 04/29/2003 (approximate).          Sexually active: No.  The current method of family planning is post menopausal status.    Exercising: Yes.    Walking, hiking Smoker:  former  Health Maintenance: Pap:  07/22/11 Neg   History of abnormal Pap:  Yes, DES Exposure  MMG:  04/11/16 Diagnostic Bilateral BIRADS2:Benign.  Right Breast Lumpectomy 08/21/11  Colonoscopy:  01/29/16 normal. F/U 10 years  BMD:   03/13/14 Osteopenia  TDaP:  Unsure  Pneumonia vaccine(s):  No Zostavax:   No Hep C testing: No Screening Labs: Oncology   reports that she quit smoking about 45 years ago. Her smoking use included Cigarettes. She smoked 1.00 pack per day. She has never used smokeless tobacco. She reports that she drinks alcohol. She reports that she does not use drugs.  Past Medical History:  Diagnosis Date  . Allergy    seasonal /rag weed  . Anxiety   . Arthritis    hands feet- "aching all over from Tamoxifen use.  . Breast cancer [facility])    right breast-surgery 2013, chemo, radiation 11'13 completed.- Mr Magrinat LOV [phone]  . Cancer ([place])   . Cataract    no surgery yet  . Depression 05/03/2013  . DES exposure in utero   . GERD (gastroesophageal reflux disease)   . Headache(784.0)    USES OTC MEDS- no recent problems  . HSV-2 infection    H/O  . Hypertension    borderline- no meds  . Lymph edema    intermittent right arm- presently okay 01-24-16  . Maintenance chemotherapy following disease   . Seasonal allergies    uses OTC as needed    Past Surgical History:  Procedure Laterality Date  . BREAST LUMPECTOMY  08/21/11   right  . CESAREAN SECTION    . COLONOSCOPY WITH PROPOFOL N/A 01/29/2016   Procedure: COLONOSCOPY  WITH PROPOFOL;  Surgeon: Garlan Fair, MD;  [facility];  Service: Endoscopy;  Laterality: N/A;  . PORT-A-CATH REMOVAL N/A 04/13/2013   Procedure: MINOR REMOVAL PORT-A-CATH;  Surgeon: Haywood Lasso, MD;  Location: [place];  Service: General;  Laterality: N/A;  . PORTACATH PLACEMENT  08/21/2011   Procedure: INSERTION PORT-A-CATH;  Surgeon: Haywood Lasso, MD;  Location: [place];  Service: General;  Laterality: Left;   Current Outpatient Prescriptions on File Prior to Visit  Medication Sig Dispense Refill  . aspirin-acetaminophen-caffeine (EXCEDRIN MIGRAINE) 250-250-65 MG per tablet Take 1 tablet by mouth as needed.    . citalopram (CELEXA) 10 MG tablet TAKE 1 TABLET (10 MG TOTAL) BY MOUTH DAILY. 90 tablet 3  . fexofenadine (ALLEGRA) 30 MG tablet Take 30 mg by mouth as needed.    . lansoprazole (PREVACID) 15 MG capsule Take 15 mg by mouth daily at 12 noon. As needed for acid reflux    . losartan (COZAAR) 50 MG tablet TAKE 1 TABLET (50 MG TOTAL) BY MOUTH DAILY. 90 tablet 3  . Naproxen Sodium (ALEVE PO) Take by mouth.    . tamoxifen (NOLVADEX) 20 MG tablet TAKE 1 TABLET (20 MG TOTAL) BY MOUTH DAILY. 30 tablet 0   No current facility-administered medications on file  prior to visit.       Family History  Problem Relation Age of Onset  . Diabetes Paternal Grandfather   . Heart disease Paternal Grandfather   . Hypertension Father   . Stroke Father   . Cancer Cousin 40    cervical or ovarian  . Breast cancer Maternal Grandmother   . Cancer Maternal Grandmother     breast  . Breast cancer Maternal Aunt   . Cancer Maternal Aunt     breast    ROS:  Pertinent items are noted in HPI.  Otherwise, a comprehensive ROS was negative.  Exam:   BP 110/70 (BP Location: Left Arm, Patient Position: Sitting, Cuff Size: Large)   Pulse 96   Resp 16   Ht 5' 2.25" (1.581 m)   Wt 178 lb (80.7 kg)   LMP 04/29/2003 (Approximate)   BMI 32.30  kg/m    Height: 5' 2.25" (158.1 cm)  Ht Readings from Last 3 Encounters:  09/01/16 5' 2.25" (1.581 m)  08/28/16 5\' 3"  (1.6 m)  05/13/16 5\' 3"  (1.6 m)    General appearance: alert, cooperative and appears stated age Head: Normocephalic, without obvious abnormality, atraumatic Neck: no adenopathy, supple, symmetrical, trachea midline and thyroid normal to inspection and palpation Lungs: clear to auscultation bilaterally Breasts: normal appearance, no masses or tenderness, radiation changes and with well healed scars on the right Heart: regular rate and rhythm Abdomen: soft, non-tender; bowel sounds normal; no masses,  no organomegaly Extremities: extremities normal, atraumatic, no cyanosis or edema Skin: Skin color, texture, turgor normal. No rashes or lesions Lymph nodes: Cervical, supraclavicular, and axillary nodes normal. No abnormal inguinal nodes palpated Neurologic: Grossly normal   Pelvic: External genitalia:  no lesions              Urethra:  normal appearing urethra with no masses, tenderness or lesions, atrophic changes noted              Bartholins and Skenes: normal                 Vagina: normal appearing vagina with normal color and discharge, no lesions              Cervix: no lesions              Pap taken: Yes.   Bimanual Exam:  Uterus:  normal size, contour, position, consistency, mobility, non-tender              Adnexa: normal adnexa and no mass, fullness, tenderness               Rectovaginal: Confirms               Anus:  normal sphincter tone, no lesions  Chaperone was present for exam.  A:  Well Woman with normal exam H/o invasive ductal carcinoma, s/p lumpectomy and chemo and radiation.  On herceptin for one year.  Initially on anastrazole for two years.  Switched to Tamoxifen.   Will finish in August. 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  Pt is UTD. pap smear and HR HPV  obtained today She will return for fasting lab work.  HbA1c, Hep C antibody, Lipids Return annually or prn

## 2016-09-02 LAB — CYTOLOGY - PAP
DIAGNOSIS: NEGATIVE
HPV (WINDOPATH): NOT DETECTED

## 2016-09-20 ENCOUNTER — Other Ambulatory Visit: Payer: Self-pay | Admitting: Oncology

## 2016-10-28 ENCOUNTER — Other Ambulatory Visit: Payer: Self-pay | Admitting: Oncology

## 2016-11-07 ENCOUNTER — Telehealth: Payer: Self-pay | Admitting: *Deleted

## 2016-11-07 NOTE — Telephone Encounter (Signed)
-----   Message from Megan Salon, MD sent at 11/06/2016  9:07 AM EDT ----- Regarding: lab work with [facility] Please call pt.  She has not done her blood work we discussed at [facility].  Can you see if she wants me to cancel the orders or is she still planning on coming for lab work.  Thanks.  Vinnie Level

## 2016-11-07 NOTE — Telephone Encounter (Signed)
Call to patient. Patient scheduled for fasting labs on Thursday 11/20/16 at 0830. Patient aware to be fasting for appointment. Future orders present for lab work. Patient agreeable to date and time of appointment.

## 2016-11-12 NOTE — Addendum Note (Signed)
Addended by: Abelino Derrick C on: 11/12/2016 11:05 AM   Modules accepted: Orders

## 2016-11-20 ENCOUNTER — Other Ambulatory Visit (INDEPENDENT_AMBULATORY_CARE_PROVIDER_SITE_OTHER): Payer: Self-pay

## 2016-11-20 DIAGNOSIS — Z205 Contact with and (suspected) exposure to viral hepatitis: Secondary | ICD-10-CM

## 2016-11-20 DIAGNOSIS — Z0000 Encounter for general adult medical examination without abnormal findings: Secondary | ICD-10-CM

## 2016-11-21 LAB — LIPID PANEL
CHOL/HDL RATIO: 6.5 ratio — AB (ref 0.0–4.4)
CHOLESTEROL TOTAL: 235 mg/dL — AB (ref 100–199)
HDL: 36 mg/dL — ABNORMAL LOW (ref >39)
LDL Calculated: 124 mg/dL — ABNORMAL HIGH (ref 0–99)
TRIGLYCERIDES: 377 mg/dL — AB (ref 0–149)
VLDL CHOLESTEROL CAL: 75 mg/dL — AB (ref 5–40)

## 2016-11-21 LAB — HEMOGLOBIN A1C
Est. average glucose Bld gHb Est-mCnc: 143 mg/dL
Hgb A1c MFr Bld: 6.6 % — ABNORMAL HIGH (ref 4.8–5.6)

## 2016-11-21 LAB — HEPATITIS C ANTIBODY: Hep C Virus Ab: <0.1 s/co ratio (ref 0.0–0.9)

## 2016-11-24 ENCOUNTER — Telehealth: Payer: Self-pay | Admitting: *Deleted

## 2016-11-24 DIAGNOSIS — R899 Unspecified abnormal finding in specimens from other organs, systems and tissues: Secondary | ICD-10-CM

## 2016-11-24 NOTE — Telephone Encounter (Signed)
Please place referral for [place] please.  Thanks.

## 2016-11-24 NOTE — Telephone Encounter (Signed)
Referral placed to [facility] per Dr. Sabra Heck. Encounter closed.

## 2016-11-24 NOTE — Telephone Encounter (Signed)
Call to patient. Results reviewed with patient and she verbalized understanding. Patient states she does not currently have a PCP and would like a referral from Dr. Sabra Heck. RN advised this message would be sent to Dr. Sabra Heck for review.   Routing to provider for review.

## 2016-11-24 NOTE — Telephone Encounter (Signed)
-----   Message from Megan Salon, MD sent at 11/22/2016  4:38 PM EDT ----- Please let pt know her hbA1C was 6.6.  She does have diabetes, now.  Her Hep C testing was negative.  Her cholesterol is not great with triglycerides at 377 but this is likely due to the diabetes.  Her total cholesterol is just mildly elevated at 235 and LDLs are 124.  She does need to see her PCP if that is who she still desires to see.  Please check with pt and see if she wants me to forward lab work.  Thanks.

## 2016-12-18 ENCOUNTER — Telehealth: Payer: Self-pay | Admitting: Obstetrics & Gynecology

## 2016-12-18 NOTE — Telephone Encounter (Signed)
Patient checking the status of a referral to a general practitioner to manage her diabetes.

## 2016-12-19 NOTE — Telephone Encounter (Signed)
Call to patient. Discussed referral status. Routed referral to [facility] with Dr. Loanne Drilling. She is agreeable. Will follow up with appointment.  Routing to provider for review.  Will close encounter

## 2017-05-06 ENCOUNTER — Ambulatory Visit: Payer: Self-pay | Admitting: Physician Assistant

## 2017-05-06 ENCOUNTER — Encounter: Payer: Self-pay | Admitting: Physician Assistant

## 2017-05-06 ENCOUNTER — Ambulatory Visit: Payer: Self-pay | Admitting: *Deleted

## 2017-05-06 VITALS — BP 150/90 | HR 103 | Temp 99.2°F | Resp 18 | Ht 62.0 in | Wt 186.4 lb

## 2017-05-06 DIAGNOSIS — H1132 Conjunctival hemorrhage, left eye: Secondary | ICD-10-CM

## 2017-05-06 NOTE — Patient Instructions (Addendum)
Given the absence of eye pain, photophobia on exam, and acute changes in vision your exam and story are overall reassuring. This will go away in time and is not contagious. If you feel you are not improving or are getting worse then please call me at [phone] and I will refer you to an eye doctor.  If at any point you have an acute change in vision then go to the emergency room.     IF you received an x-ray today, you will receive an invoice from [facility]. Please contact [facility] at [phone] with questions or concerns regarding your invoice.   IF you received labwork today, you will receive an invoice from [facility]. Please contact [facility] at [phone] with questions or concerns regarding your invoice.   Our billing staff will not be able to assist you with questions regarding bills from these companies.  You will be contacted with the lab results as soon as they are available. The fastest way to get your results is to activate your My Chart account. Instructions are located on the last page of this paperwork. If you have not heard from [place] regarding the results in 2 weeks, please contact this office.

## 2017-05-06 NOTE — Telephone Encounter (Signed)
Pt called stating that she woke up yesterday and noticed red in the white of left eye; today the entire thing is red; she also says that it ached a little yesterday but no pain today and she is diabetic; pt also states that she feels like a blood vessel is broken in her eye; nurse triage initiated and recommendation made for pt to see a physician within 3 days; pt offered and accepted appointment 05/06/17 at 1100 with Dr Mitchel Honour, [place] [facility]; pt verbalizes understanding.      Reason for Disposition . Bleeding on white of the eye  Answer Assessment - Initial Assessment Questions 1. LOCATION: Location: "What's red, the eyeball or the outer eyelids?" (Note: when callers say the eye is red, they usually mean the sclera is red)       Left eye sclera 2. REDNESS OF SCLERA: "Is the redness in one or both eyes?" "When did the redness start?"      05/05/17 3. ONSET: "When did the eye become red?" (e.g., hours, days)      24 hours 4. EYELIDS: "Are the eyelids red or swollen?" If so, ask: "How much?"      no 5. VISION: "Is there any difficulty seeing clearly?"      no 6. ITCHING: "Does it feel itchy?" If so ask: "How bad is it" (e.g., Scale 1-10; or mild, moderate, severe)     Always itchy; pt has allerfies 7. PAIN: "Is there any pain? If so, ask: "How bad is it?" (e.g., Scale 1-10; or mild, moderate, severe)    Pain rated 1-2 out of 10 8. CONTACT LENS: "Do you wear contacts?"     no 9. CAUSE: "What do you think is causing the redness?"     unsure 10. OTHER SYMPTOMS: "Do you have any other symptoms?" (e.g., fever, runny nose, cough, vomiting)       no  Protocols used: EYE - RED WITHOUT PUS-A-AH

## 2017-05-06 NOTE — Progress Notes (Addendum)
05/06/2017 11:53 AM   DOB: 03-Jan-1951 / MRN: [id number]  SUBJECTIVE:  Rachael Weaver is a 67 y.o. female presenting for left eye redness.  She denies any eye pain, sensitivity to light, foreign body sensation, HA or facial pain at this time.  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 This complaint started after that. She tells me that she could have been hit in the eye in her sleep as well, because she sleeps with multiple dogs.  This complaint started after that.  She is here to have the eye "check out" mostly because her children will not let her near her grand children until she is cleared medically.  She has No Known Allergies.   She  has a past medical history of Allergy, Anxiety, Arthritis, Breast cancer ([place]), Cancer ([place]), Cataract, Depression (05/03/2013), DES exposure in utero, GERD (gastroesophageal reflux disease), Headache(784.0), HSV-2 infection, Hypertension, Lymph edema, Maintenance chemotherapy following disease, and Seasonal allergies.    She  reports that she quit smoking about 46 years ago. Her smoking use included cigarettes. She smoked 1.00 pack per day. she has never used smokeless tobacco. She reports that she drinks alcohol. She reports that she does not use drugs. She  reports that she does not currently engage in sexual activity. She reports using the following method of birth control/protection: None. The patient  has a past surgical history that includes Cesarean section; Portacath placement (08/21/2011); Breast lumpectomy (08/21/11); Port-a-cath removal (N/A, 04/13/2013); and Colonoscopy with propofol (N/A, 01/29/2016).  Her family history includes Breast cancer in her maternal aunt and maternal grandmother; Cancer in her maternal aunt and maternal grandmother; Cancer (age of onset: 66) in her cousin; Diabetes in her paternal grandfather; Heart disease in her paternal grandfather; Hypertension in her  father; Stroke in her father.  Review of Systems  Constitutional: Negative for chills, diaphoresis and fever.  Eyes: Negative.   Respiratory: Negative for cough, hemoptysis, sputum production, shortness of breath and wheezing.   Cardiovascular: Negative for chest pain, orthopnea and leg swelling.  Gastrointestinal: Negative for nausea.  Skin: Negative for rash.  Neurological: Negative for dizziness, sensory change, speech change, focal weakness and headaches.    The problem list and medications were reviewed and updated by myself where necessary and exist elsewhere in the encounter.   OBJECTIVE:  BP (!) 150/90   Pulse (!) 103   Temp 99.2 F (37.3 C) (Oral)   Resp 18   Ht 5\' 2"  (1.575 m)   Wt 186 lb 6.4 oz (84.6 kg)   LMP 04/29/2003 (Approximate)   SpO2 99%   BMI 34.09 kg/m   Physical Exam  Constitutional: She is active.  Non-toxic appearance.  HENT:  Right Ear: Hearing, tympanic membrane, external ear and ear canal normal.  Left Ear: Hearing, tympanic membrane, external ear and ear canal normal.  Nose: Nose normal. Right sinus exhibits no maxillary sinus tenderness and no frontal sinus tenderness. Left sinus exhibits no maxillary sinus tenderness and no frontal sinus tenderness.  Mouth/Throat: Uvula is midline, oropharynx is clear and moist and mucous membranes are normal. Mucous membranes are not dry. No oropharyngeal exudate, posterior oropharyngeal edema or tonsillar abscesses.  Eyes: Right eye exhibits no chemosis, no discharge and no exudate. Left eye exhibits no chemosis, no discharge and no exudate. Right conjunctiva is not injected. Right conjunctiva has no hemorrhage. Left conjunctiva is not  injected. Left conjunctiva has a hemorrhage. No scleral icterus. Right eye exhibits normal extraocular motion and no nystagmus. Left eye exhibits normal extraocular motion and no nystagmus. Right pupil is round and reactive. Left pupil is round and reactive. Pupils are equal.    Fundoscopic exam:      The right eye shows no AV nicking, no exudate and no hemorrhage. The right eye shows red reflex.       The left eye shows no AV nicking, no exudate and no hemorrhage. The left eye shows red reflex.  Cardiovascular: Normal rate.  Pulmonary/Chest: Effort normal. No tachypnea.  Lymphadenopathy:       Head (right side): No submandibular and no tonsillar adenopathy present.       Head (left side): No submandibular and no tonsillar adenopathy present.    She has no cervical adenopathy.  Neurological: She is alert.  Skin: Skin is warm and dry. She is not diaphoretic. No pallor.     Visual Acuity Screening   Right eye Left eye Both eyes  Without correction: 20/25 20/25 20/25-1  With correction:      BP Readings from Last 3 Encounters:  05/06/17 (!) 150/90  09/01/16 110/70  08/28/16 (!) 142/75   Pulse Readings from Last 3 Encounters:  05/06/17 (!) 103  09/01/16 96  08/28/16 85      No results found for this or any previous visit (from the past 72 hour(s)).  No results found.  ASSESSMENT AND PLAN:  Shamikia was seen today for eye problem.  Diagnoses and all orders for this visit:  Subconjunctival hemorrhage of left eye: No red flags on exam.  Her pressure is mildly elevated however not to the degree that would cause this, and according to the [facility] she may or may not need therapy at this point.  I have discussed symptomatic red flags with her and provided these on her avs.  If she experiences any of these she will call or go to the ED.      The patient is advised to call or return to clinic if she does not see an improvement in symptoms, or to seek the care of the closest emergency department if she worsens with the above plan.   Philis Fendt, MHS, PA-C [facility] 05/06/2017 11:53 AM

## 2017-06-16 ENCOUNTER — Telehealth: Payer: Self-pay | Admitting: Physician Assistant

## 2017-06-16 NOTE — Telephone Encounter (Signed)
Called and spoke with pt to confirm apt tomorrow 06/17/17. Advised of time, building # and time policies. °

## 2017-06-17 ENCOUNTER — Other Ambulatory Visit: Payer: Self-pay

## 2017-06-17 ENCOUNTER — Ambulatory Visit (INDEPENDENT_AMBULATORY_CARE_PROVIDER_SITE_OTHER): Payer: Medicare Other | Admitting: Physician Assistant

## 2017-06-17 ENCOUNTER — Encounter: Payer: Self-pay | Admitting: Physician Assistant

## 2017-06-17 VITALS — BP 149/94 | HR 90 | Temp 98.6°F | Resp 16 | Ht 62.0 in | Wt 185.6 lb

## 2017-06-17 DIAGNOSIS — H6993 Unspecified Eustachian tube disorder, bilateral: Secondary | ICD-10-CM

## 2017-06-17 DIAGNOSIS — H6983 Other specified disorders of Eustachian tube, bilateral: Secondary | ICD-10-CM | POA: Diagnosis not present

## 2017-06-17 DIAGNOSIS — H6593 Unspecified nonsuppurative otitis media, bilateral: Secondary | ICD-10-CM | POA: Diagnosis not present

## 2017-06-17 MED ORDER — CETIRIZINE-PSEUDOEPHEDRINE ER 5-120 MG PO TB12: 1.0000 | ORAL_TABLET | Freq: Two times a day (BID) | ORAL | 0 refills | Status: AC

## 2017-06-17 MED ORDER — AMOXICILLIN 875 MG PO TABS: 875.0000 mg | ORAL_TABLET | Freq: Two times a day (BID) | ORAL | 0 refills | Status: AC

## 2017-06-17 NOTE — Patient Instructions (Addendum)
  With tinnitus it can be helpful to have background noise such as a sound machine.  If you can find the same pitch as the ringing you may be able to cancel out the ear ringing.  Start with his Zyrtec-D.  I think this is the best chance of making him feel better.  If this fails I would suggest trying the amoxicillin.   IF you received an x-ray today, you will receive an invoice from [facility]. Please contact [facility] at [phone] with questions or concerns regarding your invoice.   IF you received labwork today, you will receive an invoice from [facility]. Please contact [facility] at [phone] with questions or concerns regarding your invoice.   Our billing staff will not be able to assist you with questions regarding bills from these companies.  You will be contacted with the lab results as soon as they are available. The fastest way to get your results is to activate your My Chart account. Instructions are located on the last page of this paperwork. If you have not heard from [place] regarding the results in 2 weeks, please contact this office.

## 2017-06-17 NOTE — Progress Notes (Signed)
06/17/2017 10:08 AM   DOB: 1950/09/13 / MRN: [id number]  SUBJECTIVE:  Rachael Weaver is a 67 y.o. female presenting for ear pressure and constant ringing.  Problem is been present now for about 4 days and is not getting worse or better.  She is tried some Xanax and this is seem to help.  Denies any pain today.  No fever.  He has had this before.  She has No Known Allergies.   She  has a past medical history of Allergy, Anxiety, Arthritis, Breast cancer ([place]), Cancer ([place]), Cataract, Depression (05/03/2013), DES exposure in utero, GERD (gastroesophageal reflux disease), Headache(784.0), HSV-2 infection, Hypertension, Lymph edema, Maintenance chemotherapy following disease, and Seasonal allergies.    She  reports that she quit smoking about 46 years ago. Her smoking use included cigarettes. She smoked 1.00 pack per day. she has never used smokeless tobacco. She reports that she drinks alcohol. She reports that she does not use drugs. She  reports that she does not currently engage in sexual activity. She reports using the following method of birth control/protection: None. The patient  has a past surgical history that includes Cesarean section; Portacath placement (08/21/2011); Breast lumpectomy (08/21/11); Port-a-cath removal (N/A, 04/13/2013); and Colonoscopy with propofol (N/A, 01/29/2016).  Her family history includes Breast cancer in her maternal aunt and maternal grandmother; Cancer in her maternal aunt and maternal grandmother; Cancer (age of onset: 27) in her cousin; Diabetes in her paternal grandfather; Heart disease in her paternal grandfather; Hypertension in her father; Stroke in her father.  Review of Systems  Constitutional: Negative for chills and fever.  Eyes: Negative.   Cardiovascular: Negative for chest pain.  Gastrointestinal: Negative for nausea.  Skin: Negative for rash.  Neurological: Negative for dizziness and headaches.    The problem list and medications were reviewed and  updated by myself where necessary and exist elsewhere in the encounter.   OBJECTIVE:  BP (!) 149/94 (BP Location: Left Arm, Patient Position: Sitting, Cuff Size: Normal)   Pulse 90   Temp 98.6 F (37 C) (Oral)   Resp 16   Ht 5\' 2"  (1.575 m)   Wt 185 lb 9.6 oz (84.2 kg)   LMP 04/29/2003 (Approximate)   BMI 33.95 kg/m   Physical Exam  Constitutional: She is active.  Non-toxic appearance.  HENT:  Right Ear: Hearing, tympanic membrane, external ear and ear canal normal.  Left Ear: Hearing, tympanic membrane, external ear and ear canal normal.  Ears:  Nose: Nose normal. No rhinorrhea, sinus tenderness, septal deviation or nasal septal hematoma. Right sinus exhibits no maxillary sinus tenderness and no frontal sinus tenderness. Left sinus exhibits no maxillary sinus tenderness and no frontal sinus tenderness.  Mouth/Throat: Uvula is midline, oropharynx is clear and moist and mucous membranes are normal. Mucous membranes are not dry. No oropharyngeal exudate, posterior oropharyngeal edema or tonsillar abscesses.  Eyes: Conjunctivae and EOM are normal. Pupils are equal, round, and reactive to light. Right eye exhibits no discharge. Left eye exhibits no discharge. No scleral icterus.  Cardiovascular: Normal rate.  Pulmonary/Chest: Effort normal. No tachypnea.  Lymphadenopathy:       Head (right side): No submandibular and no tonsillar adenopathy present.       Head (left side): No submandibular and no tonsillar adenopathy present.    She has no cervical adenopathy.  Neurological: She is alert.  Skin: Skin is warm and dry. She is not diaphoretic. No pallor.    No results found for this or any  previous visit (from the past 72 hour(s)).  No results found.  ASSESSMENT AND PLAN:  Raynisha was seen today for tinnitus.  Diagnoses and all orders for this visit:  Dysfunction of both eustachian tubes: Patient requesting more Xanax today.  I have refused this.  Advised Zyrtec-D as this problem is  most likely driving the tinnitus and problem 2. -     cetirizine-pseudoephedrine (ZYRTEC-D) 5-120 MG tablet; Take 1 tablet by mouth 2 (two) times daily for 7 days.  Bilateral serous otitis media, unspecified chronicity -     amoxicillin (AMOXIL) 875 MG tablet; Take 1 tablet (875 mg total) by mouth 2 (two) times daily for 10 days.    The patient is advised to call or return to clinic if she does not see an improvement in symptoms, or to seek the care of the closest emergency department if she worsens with the above plan.   Philis Fendt, MHS, PA-C [facility] 06/17/2017 10:08 AM

## 2017-06-22 DIAGNOSIS — H9313 Tinnitus, bilateral: Secondary | ICD-10-CM | POA: Diagnosis not present

## 2017-06-22 DIAGNOSIS — G43109 Migraine with aura, not intractable, without status migrainosus: Secondary | ICD-10-CM | POA: Diagnosis not present

## 2017-06-22 DIAGNOSIS — H903 Sensorineural hearing loss, bilateral: Secondary | ICD-10-CM | POA: Diagnosis not present

## 2017-06-24 ENCOUNTER — Other Ambulatory Visit: Payer: Self-pay | Admitting: Otolaryngology

## 2017-06-24 DIAGNOSIS — H9313 Tinnitus, bilateral: Secondary | ICD-10-CM

## 2017-06-24 DIAGNOSIS — H903 Sensorineural hearing loss, bilateral: Secondary | ICD-10-CM

## 2017-06-25 ENCOUNTER — Ambulatory Visit
Admission: RE | Admit: 2017-06-25 | Discharge: 2017-06-25 | Disposition: A | Discharge status: Home / Self Care | Payer: Medicare Other | Source: Ambulatory Visit | Attending: Otolaryngology | Admitting: Otolaryngology

## 2017-06-25 DIAGNOSIS — H903 Sensorineural hearing loss, bilateral: Secondary | ICD-10-CM

## 2017-06-25 DIAGNOSIS — H9313 Tinnitus, bilateral: Secondary | ICD-10-CM

## 2017-06-30 ENCOUNTER — Other Ambulatory Visit: Payer: Medicare Other

## 2017-07-05 ENCOUNTER — Ambulatory Visit
Admission: RE | Admit: 2017-07-05 | Discharge: 2017-07-05 | Disposition: A | Discharge status: Home / Self Care | Payer: Medicare Other | Source: Ambulatory Visit | Attending: Otolaryngology | Admitting: Otolaryngology

## 2017-07-05 DIAGNOSIS — H9193 Unspecified hearing loss, bilateral: Secondary | ICD-10-CM | POA: Diagnosis not present

## 2017-07-05 MED ORDER — GADOBENATE DIMEGLUMINE 529 MG/ML IV SOLN
17.0000 mL | Freq: Once | INTRAVENOUS | Status: AC | PRN
  Administered 2017-07-05: 17 mL via INTRAVENOUS

## 2017-10-12 ENCOUNTER — Encounter: Payer: Self-pay | Admitting: Physician Assistant

## 2017-10-12 ENCOUNTER — Ambulatory Visit (INDEPENDENT_AMBULATORY_CARE_PROVIDER_SITE_OTHER): Payer: Medicare Other | Admitting: Physician Assistant

## 2017-10-12 ENCOUNTER — Other Ambulatory Visit: Payer: Self-pay

## 2017-10-12 VITALS — BP 142/80 | HR 101 | Temp 99.0°F | Resp 18 | Ht 62.0 in | Wt 184.2 lb

## 2017-10-12 DIAGNOSIS — R7309 Other abnormal glucose: Secondary | ICD-10-CM

## 2017-10-12 DIAGNOSIS — E782 Mixed hyperlipidemia: Secondary | ICD-10-CM | POA: Diagnosis not present

## 2017-10-12 DIAGNOSIS — E1165 Type 2 diabetes mellitus with hyperglycemia: Secondary | ICD-10-CM

## 2017-10-12 DIAGNOSIS — I10 Essential (primary) hypertension: Secondary | ICD-10-CM

## 2017-10-12 MED ORDER — OLMESARTAN MEDOXOMIL 20 MG PO TABS: 20.0000 mg | ORAL_TABLET | Freq: Every day | ORAL | 1 refills | Status: DC

## 2017-10-12 NOTE — Progress Notes (Signed)
 Rachael Weaver  MRN: [id number] DOB: 11/02/1950  PCP: Copland, Jessica C, MD  Chief Complaint  Patient presents with  . Medication Refill    BP meds losartan     Subjective:  Nyjai Weaver is a 67-year-old female presenting for medication refill. She has been off her losartan for a couple weeks and notes an increase in headaches (3 in the last 2 weeks) since then; however, she also notes a history of optical migraines.   Of note she has developed bilateral tinnitus beginning January 2019. She has seen ENT and received work-up (including MRI) which has been unremarkable. She notes trying cranial sacral massages which have cut the tinnitus in half.  She has not been involved in any of the losartan recalls.  History is obtained by patient.  Review of Systems  Constitutional: Negative for chills and fever.  HENT: Positive for tinnitus.   Eyes: Positive for photophobia (chronic associated with migraines). Negative for visual disturbance.  Respiratory: Negative for chest tightness and shortness of breath.   Cardiovascular: Negative for chest pain, palpitations and leg swelling.  Gastrointestinal: Negative for blood in stool, constipation and diarrhea.  Genitourinary: Negative for difficulty urinating, dysuria and hematuria.  Neurological: Positive for light-headedness (since being off meds) and headaches. Negative for dizziness.    Patient Active Problem List   Diagnosis Date Noted  . Hot flashes 10/18/2014  . Neuropathy 06/09/2014  . Depression 05/03/2013  . HTN (hypertension) 08/23/2012  . Allergy   . Breast cancer (HCC) 08/26/2011  . Malignant neoplasm of upper-outer quadrant of right breast in female, estrogen receptor positive (HCC) 07/24/2011  . Allergy history unknown 07/10/2011    Current Outpatient Medications on File Prior to Visit  Medication Sig Dispense Refill  . aspirin-acetaminophen-caffeine (EXCEDRIN MIGRAINE) 250-250-65 MG per tablet Take 1 tablet by mouth as  needed.    . fexofenadine (ALLEGRA) 30 MG tablet Take 30 mg by mouth as needed.    . lansoprazole (PREVACID) 15 MG capsule Take 15 mg by mouth daily at 12 noon. As needed for acid reflux    . losartan (COZAAR) 50 MG tablet TAKE 1 TABLET (50 MG TOTAL) BY MOUTH DAILY. 90 tablet 3   No current facility-administered medications on file prior to visit.     No Known Allergies  Past Medical History:  Diagnosis Date  . Allergy    seasonal /rag weed  . Anxiety   . Arthritis    hands feet- "aching all over from Tamoxifen use.  . Breast cancer (HCC)    right breast-surgery 2013, chemo, radiation 11'13 completed.- Mr Magrinat LOV 12'16  . Cancer (HCC)   . Cataract    no surgery yet  . Depression 05/03/2013  . DES exposure in utero   . GERD (gastroesophageal reflux disease)   . Headache(784.0)    USES OTC MEDS- no recent problems  . HSV-2 infection    H/O  . Hypertension    borderline- no meds  . Lymph edema    intermittent right arm- presently okay 01-24-16  . Maintenance chemotherapy following disease   . Seasonal allergies    uses OTC as needed   Social History   Social History Narrative  . Not on file   Social History   Tobacco Use  . Smoking status: Former Smoker    Packs/day: 1.00    Types: Cigarettes    Last attempt to quit: 04/29/1971    Years since quitting: 46.4  . Smokeless tobacco: Never Used    Substance Use Topics  . Alcohol use: Yes    Alcohol/week: 0.0 - 0.6 oz    Comment: rarely social glass wine  . Drug use: No   family history includes Breast cancer in her maternal aunt and maternal grandmother; Cancer in her maternal aunt and maternal grandmother; Cancer (age of onset: 55) in her cousin; Diabetes in her paternal grandfather; Heart disease in her paternal grandfather; Hypertension in her father; Stroke in her father.     Objective:  BP (!) 142/80   Pulse (!) 101   Temp 99 F (37.2 C) (Oral)   Resp 18   Ht 5' 2" (1.575 m)   Wt 184 lb 3.2 oz (83.6 kg)    LMP 04/29/2003 (Approximate)   SpO2 96%   BMI 33.69 kg/m  Body mass index is 33.69 kg/m.  Wt Readings from Last 3 Encounters:  10/12/17 184 lb 3.2 oz (83.6 kg)  06/17/17 185 lb 9.6 oz (84.2 kg)  05/06/17 186 lb 6.4 oz (84.6 kg)    Physical Exam  Constitutional: She is oriented to person, place, and time. She appears well-developed and well-nourished. No distress.  HENT:  Head: Normocephalic and atraumatic.  Right Ear: Hearing and external ear normal.  Left Ear: Hearing and external ear normal.  Eyes: Pupils are equal, round, and reactive to light. Conjunctivae are normal.  Neck: Normal range of motion. No thyromegaly present.  Cardiovascular: Normal rate, regular rhythm and normal heart sounds. Exam reveals no gallop and no friction rub.  No murmur heard. Pulses:      Radial pulses are 2+ on the right side, and 2+ on the left side.       Posterior tibial pulses are 2+ on the right side, and 2+ on the left side.  Pulmonary/Chest: Effort normal and breath sounds normal. No stridor. She has no wheezes. She has no rales.  Musculoskeletal:       Right lower leg: She exhibits no edema.       Left lower leg: She exhibits no edema.  Lymphadenopathy:    She has no cervical adenopathy.  Neurological: She is alert and oriented to person, place, and time.  Skin: Skin is warm and dry.  Psychiatric: She has a normal mood and affect. Her behavior is normal. Judgment and thought content normal.  Vitals reviewed.   Assessment and Plan :  1. Essential hypertension Patient would like to switch medications due to losartan recall. Will start her on benicar. Educated her that this is in the same class as benicar and actually preferred now that it has gone generic.  - CMP14+EGFR - olmesartan (BENICAR) 20 MG tablet; Take 1 tablet (20 mg total) by mouth daily.  Dispense: 30 tablet; Refill: 1  2. Elevated hemoglobin A1c 11/20/16 A1C 6.6. Will reevaluate necessity of treatment based on pending  labs. Educated patient that future options may include Metformin and dietary changes depending on her personal goals.  - Hemoglobin A1c  3. Elevated triglycerides with high cholesterol 11/20/16 Labs: TC 235, TG 377, HDL 36, LDL 124. Will reevaluate upon pending labs, but will likely need to be placed on statin.  - Lipid panel   Patient verbalized to me that they understand the following: diagnosis, what is being done for them, what to expect and what should be done at home.  Their questions have been answered.  See after visit summary for patient specific instructions.  I was directly involved with the patient's care and agree with the physical, diagnosis and  treatment plan. Note was adjusted with my history and physical exam findings.   Sarah Weber PA-C  [facility] 10/12/2017 5:32 PM 

## 2017-10-12 NOTE — Patient Instructions (Signed)
     IF you received an x-ray today, you will receive an invoice from [facility]. Please contact [facility] at [phone] with questions or concerns regarding your invoice.   IF you received labwork today, you will receive an invoice from [facility]. Please contact [facility] at [phone] with questions or concerns regarding your invoice.   Our billing staff will not be able to assist you with questions regarding bills from these companies.  You will be contacted with the lab results as soon as they are available. The fastest way to get your results is to activate your My Chart account. Instructions are located on the last page of this paperwork. If you have not heard from us regarding the results in 2 weeks, please contact this office.     

## 2017-10-13 LAB — CMP14+EGFR
A/G RATIO: 1.9 (ref 1.2–2.2)
ALBUMIN: 4.3 g/dL (ref 3.6–4.8)
ALK PHOS: 127 IU/L — AB (ref 39–117)
ALT: 16 IU/L (ref 0–32)
AST: 19 IU/L (ref 0–40)
BILIRUBIN TOTAL: 0.2 mg/dL (ref 0.0–1.2)
BUN / CREAT RATIO: 18 (ref 12–28)
BUN: 13 mg/dL (ref 8–27)
CHLORIDE: 103 mmol/L (ref 96–106)
CO2: 22 mmol/L (ref 20–29)
Calcium: 9.2 mg/dL (ref 8.7–10.3)
Creatinine, Ser: 0.71 mg/dL (ref 0.57–1.00)
GFR calc Af Amer: 103 mL/min/{1.73_m2} (ref >59)
GFR calc non Af Amer: 90 mL/min/{1.73_m2} (ref >59)
GLOBULIN, TOTAL: 2.3 g/dL (ref 1.5–4.5)
Glucose: 236 mg/dL — ABNORMAL HIGH (ref 65–99)
POTASSIUM: 3.8 mmol/L (ref 3.5–5.2)
SODIUM: 142 mmol/L (ref 134–144)
Total Protein: 6.6 g/dL (ref 6.0–8.5)

## 2017-10-13 LAB — LIPID PANEL
CHOL/HDL RATIO: 7.3 ratio — AB (ref 0.0–4.4)
Cholesterol, Total: 249 mg/dL — ABNORMAL HIGH (ref 100–199)
HDL: 34 mg/dL — ABNORMAL LOW (ref >39)
Triglycerides: 415 mg/dL — ABNORMAL HIGH (ref 0–149)

## 2017-10-13 LAB — HEMOGLOBIN A1C
Est. average glucose Bld gHb Est-mCnc: 183 mg/dL
HEMOGLOBIN A1C: 8 % — AB (ref 4.8–5.6)

## 2017-10-14 ENCOUNTER — Encounter: Payer: Self-pay | Admitting: Physician Assistant

## 2017-10-14 DIAGNOSIS — E782 Mixed hyperlipidemia: Secondary | ICD-10-CM | POA: Insufficient documentation

## 2017-10-14 DIAGNOSIS — R7309 Other abnormal glucose: Secondary | ICD-10-CM | POA: Insufficient documentation

## 2017-10-14 DIAGNOSIS — E1165 Type 2 diabetes mellitus with hyperglycemia: Secondary | ICD-10-CM | POA: Insufficient documentation

## 2017-10-14 MED ORDER — METFORMIN HCL ER 500 MG PO TB24: 500.0000 mg | ORAL_TABLET | Freq: Two times a day (BID) | ORAL | 0 refills | Status: DC

## 2017-10-14 MED ORDER — ROSUVASTATIN CALCIUM 20 MG PO TABS: 20.0000 mg | ORAL_TABLET | Freq: Every day | ORAL | 0 refills | Status: DC

## 2017-10-14 NOTE — Addendum Note (Signed)
Addended by: Mancel Bale on: 10/14/2017 11:12 AM   Modules accepted: Orders

## 2017-12-21 ENCOUNTER — Other Ambulatory Visit: Payer: Self-pay | Admitting: Oncology

## 2017-12-21 DIAGNOSIS — Z853 Personal history of malignant neoplasm of breast: Secondary | ICD-10-CM

## 2017-12-24 ENCOUNTER — Other Ambulatory Visit: Payer: Self-pay | Admitting: Physician Assistant

## 2017-12-24 DIAGNOSIS — I10 Essential (primary) hypertension: Secondary | ICD-10-CM

## 2017-12-29 ENCOUNTER — Other Ambulatory Visit: Payer: Self-pay | Admitting: Oncology

## 2017-12-29 ENCOUNTER — Other Ambulatory Visit: Payer: Self-pay

## 2017-12-29 DIAGNOSIS — Z853 Personal history of malignant neoplasm of breast: Secondary | ICD-10-CM

## 2017-12-30 ENCOUNTER — Other Ambulatory Visit: Payer: Self-pay | Admitting: Adult Health

## 2017-12-30 DIAGNOSIS — Z853 Personal history of malignant neoplasm of breast: Secondary | ICD-10-CM

## 2018-01-08 ENCOUNTER — Encounter

## 2018-01-08 ENCOUNTER — Ambulatory Visit
Admission: RE | Admit: 2018-01-08 | Discharge: 2018-01-08 | Disposition: A | Discharge status: Home / Self Care | Payer: Medicare Other | Source: Ambulatory Visit | Attending: Adult Health | Admitting: Adult Health

## 2018-01-08 ENCOUNTER — Other Ambulatory Visit: Payer: Self-pay | Admitting: Adult Health

## 2018-01-08 DIAGNOSIS — Z1239 Encounter for other screening for malignant neoplasm of breast: Secondary | ICD-10-CM

## 2018-01-08 DIAGNOSIS — Z1231 Encounter for screening mammogram for malignant neoplasm of breast: Secondary | ICD-10-CM | POA: Diagnosis not present

## 2018-01-08 DIAGNOSIS — Z853 Personal history of malignant neoplasm of breast: Secondary | ICD-10-CM

## 2018-03-16 ENCOUNTER — Ambulatory Visit (INDEPENDENT_AMBULATORY_CARE_PROVIDER_SITE_OTHER): Payer: Medicare Other | Admitting: Physician Assistant

## 2018-03-16 ENCOUNTER — Encounter: Payer: Self-pay | Admitting: Physician Assistant

## 2018-03-16 ENCOUNTER — Other Ambulatory Visit: Payer: Self-pay

## 2018-03-16 VITALS — BP 150/83 | HR 92 | Temp 98.6°F | Resp 18 | Ht 62.0 in | Wt 180.8 lb

## 2018-03-16 DIAGNOSIS — I10 Essential (primary) hypertension: Secondary | ICD-10-CM

## 2018-03-16 DIAGNOSIS — E1165 Type 2 diabetes mellitus with hyperglycemia: Secondary | ICD-10-CM

## 2018-03-16 DIAGNOSIS — Z114 Encounter for screening for human immunodeficiency virus [HIV]: Secondary | ICD-10-CM

## 2018-03-16 DIAGNOSIS — E785 Hyperlipidemia, unspecified: Secondary | ICD-10-CM | POA: Diagnosis not present

## 2018-03-16 DIAGNOSIS — Z23 Encounter for immunization: Secondary | ICD-10-CM | POA: Diagnosis not present

## 2018-03-16 LAB — POCT GLYCOSYLATED HEMOGLOBIN (HGB A1C): Hemoglobin A1C: 7.5 % — AB (ref 4.0–5.6)

## 2018-03-16 MED ORDER — ROSUVASTATIN CALCIUM 20 MG PO TABS: 20.0000 mg | ORAL_TABLET | Freq: Every day | ORAL | 1 refills | Status: DC

## 2018-03-16 MED ORDER — METFORMIN HCL ER 500 MG PO TB24: 500.0000 mg | ORAL_TABLET | Freq: Two times a day (BID) | ORAL | 1 refills | Status: AC

## 2018-03-16 MED ORDER — OLMESARTAN MEDOXOMIL 20 MG PO TABS: 20.0000 mg | ORAL_TABLET | Freq: Every day | ORAL | 1 refills | Status: DC

## 2018-03-16 NOTE — Patient Instructions (Addendum)
For diabetes, increase to metformin to twice daily. Continue blood pressure and cholesterol medication.  Follow up in 3 months for reevaluation.  Thank you for letting me participate in your health and well being.     If you have lab work done today you will be contacted with your lab results within the next 2 weeks.  If you have not heard from [place] then please contact us. The fastest way to get your results is to register for My Chart.  Diabetes Mellitus and Nutrition When you have diabetes (diabetes mellitus), it is very important to have healthy eating habits because your blood sugar (glucose) levels are greatly affected by what you eat and drink. Eating healthy foods in the appropriate amounts, at about the same times every day, can help you:  Control your blood glucose.  Lower your risk of heart disease.  Improve your blood pressure.  Reach or maintain a healthy weight.  Every person with diabetes is different, and each person has different needs for a meal plan. Your health care provider may recommend that you work with a diet and nutrition specialist (dietitian) to make a meal plan that is best for you. Your meal plan may vary depending on factors such as:  The calories you need.  The medicines you take.  Your weight.  Your blood glucose, blood pressure, and cholesterol levels.  Your activity level.  Other health conditions you have, such as heart or kidney disease.  How do carbohydrates affect me? Carbohydrates affect your blood glucose level more than any other type of food. Eating carbohydrates naturally increases the amount of glucose in your blood. Carbohydrate counting is a method for keeping track of how many carbohydrates you eat. Counting carbohydrates is important to keep your blood glucose at a healthy level, especially if you use insulin or take certain oral diabetes medicines. It is important to know how many carbohydrates you can safely have in each meal. This  is different for every person. Your dietitian can help you calculate how many carbohydrates you should have at each meal and for snack. Foods that contain carbohydrates include:  Bread, cereal, rice, pasta, and crackers.  Potatoes and corn.  Peas, beans, and lentils.  Milk and yogurt.  Fruit and juice.  Desserts, such as cakes, cookies, ice cream, and candy.  How does alcohol affect me? Alcohol can cause a sudden decrease in blood glucose (hypoglycemia), especially if you use insulin or take certain oral diabetes medicines. Hypoglycemia can be a life-threatening condition. Symptoms of hypoglycemia (sleepiness, dizziness, and confusion) are similar to symptoms of having too much alcohol. If your health care provider says that alcohol is safe for you, follow these guidelines:  Limit alcohol intake to no more than 1 drink per day for nonpregnant women and 2 drinks per day for men. One drink equals 12 oz of beer, 5 oz of wine, or 1 oz of hard liquor.  Do not drink on an empty stomach.  Keep yourself hydrated with water, diet soda, or unsweetened iced tea.  Keep in mind that regular soda, juice, and other mixers may contain a lot of sugar and must be counted as carbohydrates.  What are tips for following this plan? Reading food labels  Start by checking the serving size on the label. The amount of calories, carbohydrates, fats, and other nutrients listed on the label are based on one serving of the food. Many foods contain more than one serving per package.  Check the total grams (  g) of carbohydrates in one serving. You can calculate the number of servings of carbohydrates in one serving by dividing the total carbohydrates by 15. For example, if a food has 30 g of total carbohydrates, it would be equal to 2 servings of carbohydrates.  Check the number of grams (g) of saturated and trans fats in one serving. Choose foods that have low or no amount of these fats.  Check the number of  milligrams (mg) of sodium in one serving. Most people should limit total sodium intake to less than 2,300 mg per day.  Always check the nutrition information of foods labeled as "low-fat" or "nonfat". These foods may be higher in added sugar or refined carbohydrates and should be avoided.  Talk to your dietitian to identify your daily goals for nutrients listed on the label. Shopping  Avoid buying canned, premade, or processed foods. These foods tend to be high in fat, sodium, and added sugar.  Shop around the outside edge of the grocery store. This includes fresh fruits and vegetables, bulk grains, fresh meats, and fresh dairy. Cooking  Use low-heat cooking methods, such as baking, instead of high-heat cooking methods like deep frying.  Cook using healthy oils, such as olive, canola, or sunflower oil.  Avoid cooking with butter, cream, or high-fat meats. Meal planning  Eat meals and snacks regularly, preferably at the same times every day. Avoid going long periods of time without eating.  Eat foods high in fiber, such as fresh fruits, vegetables, beans, and whole grains. Talk to your dietitian about how many servings of carbohydrates you can eat at each meal.  Eat 4-6 ounces of lean protein each day, such as lean meat, chicken, fish, eggs, or tofu. 1 ounce is equal to 1 ounce of meat, chicken, or fish, 1 egg, or 1/4 cup of tofu.  Eat some foods each day that contain healthy fats, such as avocado, nuts, seeds, and fish. Lifestyle   Check your blood glucose regularly.  Exercise at least 30 minutes 5 or more days each week, or as told by your health care provider.  Take medicines as told by your health care provider.  Do not use any products that contain nicotine or tobacco, such as cigarettes and e-cigarettes. If you need help quitting, ask your health care provider.  Work with a Social worker or diabetes educator to identify strategies to manage stress and any emotional and social  challenges. What are some questions to ask my health care provider?  Do I need to meet with a diabetes educator?  Do I need to meet with a dietitian?  What number can I call if I have questions?  When are the best times to check my blood glucose? Where to find more information:  American Diabetes Association: diabetes.org/food-and-fitness/food  Academy of Nutrition and Dietetics: PokerClues.dk  [employer] of Diabetes and Digestive and Kidney Diseases (NIH): ContactWire.be Summary  A healthy meal plan will help you control your blood glucose and maintain a healthy lifestyle.  Working with a diet and nutrition specialist (dietitian) can help you make a meal plan that is best for you.  Keep in mind that carbohydrates and alcohol have immediate effects on your blood glucose levels. It is important to count carbohydrates and to use alcohol carefully. This information is not intended to replace advice given to you by your health care provider. Make sure you discuss any questions you have with your health care provider. Document Released: 01/09/2005 Document Revised: 05/19/2016 Document Reviewed: 05/19/2016  Elsevier Interactive Patient Education  2018 [place].   Preventing High Cholesterol Cholesterol is a waxy, fat-like substance that your body needs in small amounts. Your liver makes all the cholesterol that your body needs. Having high cholesterol (hypercholesterolemia) increases your risk for heart disease and stroke. Extra (excess) cholesterol comes from the food you eat, such as animal-based fat (saturated fat) from meat and some dairy products. High cholesterol can often be prevented with diet and lifestyle changes. If you already have high cholesterol, you can control it with diet and lifestyle changes, as well as medicine. What nutrition changes  can be made?  Eat less saturated fat. Foods that contain saturated fat include red meat and some dairy products.  Avoid processed meats, like bacon and lunch meats.  Avoid trans fats, which are found in margarine and some baked goods.  Avoid foods and beverages that have added sugars.  Eat more fruits, vegetables, and whole grains.  Choose healthy sources of protein, such as fish, poultry, and nuts.  Choose healthy sources of fat, such as: ? Nuts. ? Vegetable oils, especially olive oil. ? Fish that have healthy fats (omega-3 fatty acids), such as mackerel or salmon. What lifestyle changes can be made?  Lose weight if you are overweight. Losing 5-10 lb (2.3-4.5 kg) can help prevent or control high cholesterol and reduce your risk for diabetes and high blood pressure. Ask your health care provider to help you with a diet and exercise plan to safely lose weight.  Get enough exercise. Do at least 150 minutes of moderate-intensity exercise each week. ? You could do this in short exercise sessions several times a day, or you could do longer exercise sessions a few times a week. For example, you could take a brisk 10-minute walk or bike ride, 3 times a day, for 5 days a week.  Do not smoke. If you need help quitting, ask your health care provider.  Limit your alcohol intake. If you drink alcohol, limit alcohol intake to no more than 1 drink a day for nonpregnant women and 2 drinks a day for men. One drink equals 12 oz of beer, 5 oz of wine, or 1 oz of hard liquor. Why are these changes important? If you have high cholesterol, deposits (plaques) may build up on the walls of your blood vessels. Plaques make the arteries narrower and stiffer, which can restrict or block blood flow and cause blood clots to form. This greatly increases your risk for heart attack and stroke. Making diet and lifestyle changes can reduce your risk for these life-threatening conditions. What can I do to lower my  risk?  Manage your risk factors for high cholesterol. Talk with your health care provider about all of your risk factors and how to lower your risk.  Manage other conditions that you have, such as diabetes or high blood pressure (hypertension).  Have your cholesterol checked at regular intervals.  Keep all follow-up visits as told by your health care provider. This is important. How is this treated? In addition to diet and lifestyle changes, your health care provider may recommend medicines to help lower cholesterol, such as a medicine to reduce the amount of cholesterol made in your liver. You may need medicine if:  Diet and lifestyle changes do not lower your cholesterol enough.  You have high cholesterol and other risk factors for heart disease or stroke.  Take over-the-counter and prescription medicines only as told by your health care provider. Where to find more  information:  American Heart Association: ThisTune.com.pt.jsp  National Heart, Lung, and Blood Institute: FrenchToiletries.com.cy Summary  High cholesterol increases your risk for heart disease and stroke. By keeping your cholesterol level low, you can reduce your risk for these conditions.  Diet and lifestyle changes are the most important steps in preventing high cholesterol.  Work with your health care provider to manage your risk factors, and have your blood tested regularly. This information is not intended to replace advice given to you by your health care provider. Make sure you discuss any questions you have with your health care provider. Document Released: 04/29/2015 Document Revised: 12/22/2015 Document Reviewed: 12/22/2015 Elsevier Interactive Patient Education  2018 [employer].  IF you received an x-ray today, you will receive an invoice from [facility]. Please contact [facility]  Radiology at [phone] with questions or concerns regarding your invoice.   IF you received labwork today, you will receive an invoice from [place]. Please contact [facility] at [phone] with questions or concerns regarding your invoice.   Our billing staff will not be able to assist you with questions regarding bills from these companies.  You will be contacted with the lab results as soon as they are available. The fastest way to get your results is to activate your My Chart account. Instructions are located on the last page of this paperwork. If you have not heard from [place] regarding the results in 2 weeks, please contact this office.

## 2018-03-16 NOTE — Progress Notes (Signed)
MRN: [id number]  Subjective:   Rachael Weaver is a 67 y.o. female who presents for follow up on all chronic medical conditions. Has been out of all meds for 1.5 weeks.   1) HTN: Has had dx for at least 5 years. Typically, takes benicar '20mg'$  daily. When on the medication, bp is well wnl at [phone] systolically.   2) T2DM:Diagnosis was made 09/2017, was prediabetic prior to this. A1C was 8.0.  Managed with metformin XR '500mg'$  BID. Was really only taking one a day for the past 3 months.  Denies adverse effects including metallic taste, hypoglycemia, nausea, vomiting.Patient is checking home blood sugars. Home blood sugar records: patient does not check sugars. Diet consists of eggs, lean meats, vegetables, some carbs. Drinks coffee and water. Hikes a lot but had tear in knee. Was walking daily before that but not as much recently.  Denies smoking or alcohol use. Known diabetic complications: none. Immunizations: Flu vaccine: 2016, shingles: never , pneumococal vaccine: never  3) HLD: Takes crestor '20mg'$  daily. Started on this 09/2017. Tolerating well.   Review of Systems  Constitutional: Negative for chills, diaphoresis and fever.  Eyes: Negative for blurred vision.  Neurological: Negative for dizziness.  Endo/Heme/Allergies: Negative for polydipsia.    Social History   Socioeconomic History  . Marital status: Divorced    Spouse name: Not on file  . Number of children: 2  . Years of education: Not on file  . Highest education level: Not on file  Occupational History    Comment: commercial real estate appraiser  Social Needs  . Financial resource strain: Not on file  . Food insecurity:    Worry: Not on file    Inability: Not on file  . Transportation needs:    Medical: Not on file    Non-medical: Not on file  Tobacco Use  . Smoking status: Former Smoker    Packs/day: 1.00    Types: Cigarettes    Last attempt to quit: 04/29/1971    Years since quitting: 46.9  . Smokeless  tobacco: Never Used  Substance and Sexual Activity  . Alcohol use: Yes    Alcohol/week: 0.0 - 1.0 standard drinks    Comment: rarely social glass wine  . Drug use: No  . Sexual activity: Not Currently    Birth control/protection: None  Lifestyle  . Physical activity:    Days per week: Not on file    Minutes per session: Not on file  . Stress: Not on file  Relationships  . Social connections:    Talks on phone: Not on file    Gets together: Not on file    Attends religious service: Not on file    Active member of club or organization: Not on file    Attends meetings of clubs or organizations: Not on file    Relationship status: Not on file  . Intimate partner violence:    Fear of current or ex partner: Not on file    Emotionally abused: Not on file    Physically abused: Not on file    Forced sexual activity: Not on file  Other Topics Concern  . Not on file  Social History Narrative  . Not on file      Objective:   PHYSICAL EXAM BP (!) 150/83   Pulse 92   Temp 98.6 F (37 C) (Oral)   Resp 18   Ht '5\' 2"'$  (1.575 m)   Wt 180 lb 12.8 oz (82 kg)  LMP 04/29/2003 (Approximate)   SpO2 96%   BMI 33.07 kg/m   Physical Exam  Constitutional: She is oriented to person, place, and time. She appears well-developed and well-nourished. No distress.  HENT:  Head: Normocephalic and atraumatic.  Mouth/Throat: Uvula is midline, oropharynx is clear and moist and mucous membranes are normal.  Eyes: Pupils are equal, round, and reactive to light. Conjunctivae and EOM are normal.  Neck: Normal range of motion.  Cardiovascular: Normal rate, regular rhythm, normal heart sounds and intact distal pulses.  Pulmonary/Chest: Effort normal and breath sounds normal. She has no wheezes. She has no rhonchi. She has no rales.  Musculoskeletal:       Right lower leg: She exhibits no swelling.       Left lower leg: She exhibits no swelling.  Neurological: She is alert and oriented to person,  place, and time.  Skin: Skin is warm and dry.  Psychiatric: She has a normal mood and affect.  Vitals reviewed.   Diabetic Foot Exam - Simple   Simple Foot Form Visual Inspection No deformities, no ulcerations, no other skin breakdown bilaterally:  Yes Sensation Testing Intact to touch and monofilament testing bilaterally:  Yes Pulse Check Posterior Tibialis and Dorsalis pulse intact bilaterally:  Yes Comments     Results for orders placed or performed in visit on 03/16/18 (from the past 24 hour(s))  POCT glycosylated hemoglobin (Hb A1C)     Status: Abnormal   Collection Time: 03/16/18 11:21 AM  Result Value Ref Range   Hemoglobin A1C 7.5 (A) 4.0 - 5.6 %   HbA1c POC (<> result, manual entry)     HbA1c, POC (prediabetic range)     HbA1c, POC (controlled diabetic range)      Assessment and Plan :  1. Uncontrolled type 2 diabetes mellitus with hyperglycemia (HCC) A1C has improved form 8 to 7.5.  Recommend metformin twice daily as opposed to once daily.  Continue working on lifestyle modifications.  Follow-up in 3 months for evaluation. - HM Diabetes Foot Exam - CBC with Differential/Platelet - CMP14+EGFR - Lipid panel - TSH - Microalbumin/Creatinine Ratio, Urine - POCT urinalysis dipstick - POCT glycosylated hemoglobin (Hb A1C) - Ambulatory referral to Ophthalmology - metFORMIN (GLUCOPHAGE-XR) 500 MG 24 hr tablet; Take 1 tablet (500 mg total) by mouth 2 (two) times daily.  Dispense: 180 tablet; Refill: 1  2. Need for influenza vaccination - Flu Vaccine QUAD 36+ mos IM  3. Hyperlipidemia, unspecified hyperlipidemia type - Lipid panel - rosuvastatin (CRESTOR) 20 MG tablet; Take 1 tablet (20 mg total) by mouth daily.  Dispense: 90 tablet; Refill: 1  4. Screening for HIV (human immunodeficiency virus) - HIV Antibody (routine testing w rflx)  5. Essential hypertension Home BP readings while on medication are well controlled.  She has been out of  medication for a week.   Asymptomatic.  Continue with current medication regimen.  Follow-up in 2 months for evaluation. - olmesartan (BENICAR) 20 MG tablet; Take 1 tablet (20 mg total) by mouth daily. Patient needs office visit for more refills  Dispense: 90 tablet; Refill: [place], PA-C  [facility] [place] 03/16/2018 11:21 AM

## 2018-03-17 LAB — LIPID PANEL
CHOL/HDL RATIO: 7.1 ratio — AB (ref 0.0–4.4)
Cholesterol, Total: 271 mg/dL — ABNORMAL HIGH (ref 100–199)
HDL: 38 mg/dL — ABNORMAL LOW (ref >39)
LDL Calculated: 180 mg/dL — ABNORMAL HIGH (ref 0–99)
TRIGLYCERIDES: 267 mg/dL — AB (ref 0–149)
VLDL Cholesterol Cal: 53 mg/dL — ABNORMAL HIGH (ref 5–40)

## 2018-03-17 LAB — CMP14+EGFR
A/G RATIO: 2 (ref 1.2–2.2)
ALT: 16 IU/L (ref 0–32)
AST: 17 IU/L (ref 0–40)
Albumin: 4.5 g/dL (ref 3.6–4.8)
Alkaline Phosphatase: 143 IU/L — ABNORMAL HIGH (ref 39–117)
BUN/Creatinine Ratio: 18 (ref 12–28)
BUN: 14 mg/dL (ref 8–27)
Bilirubin Total: 0.4 mg/dL (ref 0.0–1.2)
CALCIUM: 9.2 mg/dL (ref 8.7–10.3)
CO2: 19 mmol/L — ABNORMAL LOW (ref 20–29)
CREATININE: 0.76 mg/dL (ref 0.57–1.00)
Chloride: 105 mmol/L (ref 96–106)
GFR calc Af Amer: 95 mL/min/{1.73_m2} (ref >59)
GFR, EST NON AFRICAN AMERICAN: 83 mL/min/{1.73_m2} (ref >59)
Globulin, Total: 2.2 g/dL (ref 1.5–4.5)
Glucose: 194 mg/dL — ABNORMAL HIGH (ref 65–99)
POTASSIUM: 4.3 mmol/L (ref 3.5–5.2)
Sodium: 143 mmol/L (ref 134–144)
Total Protein: 6.7 g/dL (ref 6.0–8.5)

## 2018-03-17 LAB — CBC WITH DIFFERENTIAL/PLATELET
BASOS ABS: 0 10*3/uL (ref 0.0–0.2)
Basos: 0 %
EOS (ABSOLUTE): 0.1 10*3/uL (ref 0.0–0.4)
EOS: 2 %
HEMOGLOBIN: 14.4 g/dL (ref 11.1–15.9)
Hematocrit: 43.4 % (ref 34.0–46.6)
Immature Grans (Abs): 0 10*3/uL (ref 0.0–0.1)
Immature Granulocytes: 0 %
LYMPHS ABS: 2.2 10*3/uL (ref 0.7–3.1)
Lymphs: 36 %
MCH: 29.7 pg (ref 26.6–33.0)
MCHC: 33.2 g/dL (ref 31.5–35.7)
MCV: 90 fL (ref 79–97)
MONOS ABS: 0.5 10*3/uL (ref 0.1–0.9)
Monocytes: 8 %
Neutrophils Absolute: 3.2 10*3/uL (ref 1.4–7.0)
Neutrophils: 54 %
Platelets: 257 10*3/uL (ref 150–450)
RBC: 4.85 x10E6/uL (ref 3.77–5.28)
RDW: 12 % — ABNORMAL LOW (ref 12.3–15.4)
WBC: 6.1 10*3/uL (ref 3.4–10.8)

## 2018-03-17 LAB — TSH: TSH: 1.24 u[IU]/mL (ref 0.450–4.500)

## 2018-03-17 LAB — HIV ANTIBODY (ROUTINE TESTING W REFLEX): HIV Screen 4th Generation wRfx: NONREACTIVE

## 2018-03-18 ENCOUNTER — Encounter: Payer: Self-pay | Admitting: Physician Assistant

## 2018-04-22 DIAGNOSIS — H04123 Dry eye syndrome of bilateral lacrimal glands: Secondary | ICD-10-CM | POA: Diagnosis not present

## 2018-04-22 DIAGNOSIS — H0289 Other specified disorders of eyelid: Secondary | ICD-10-CM | POA: Diagnosis not present

## 2018-04-22 DIAGNOSIS — H10413 Chronic giant papillary conjunctivitis, bilateral: Secondary | ICD-10-CM | POA: Diagnosis not present

## 2018-04-22 DIAGNOSIS — E119 Type 2 diabetes mellitus without complications: Secondary | ICD-10-CM | POA: Diagnosis not present

## 2018-04-22 DIAGNOSIS — H2513 Age-related nuclear cataract, bilateral: Secondary | ICD-10-CM | POA: Diagnosis not present

## 2018-11-25 DIAGNOSIS — E119 Type 2 diabetes mellitus without complications: Secondary | ICD-10-CM | POA: Diagnosis not present

## 2018-11-25 DIAGNOSIS — I10 Essential (primary) hypertension: Secondary | ICD-10-CM | POA: Diagnosis not present

## 2018-11-25 DIAGNOSIS — C50919 Malignant neoplasm of unspecified site of unspecified female breast: Secondary | ICD-10-CM | POA: Diagnosis not present

## 2018-11-25 DIAGNOSIS — E559 Vitamin D deficiency, unspecified: Secondary | ICD-10-CM | POA: Diagnosis not present

## 2018-11-26 ENCOUNTER — Other Ambulatory Visit: Payer: Self-pay | Admitting: Family Medicine

## 2018-11-26 DIAGNOSIS — Z1231 Encounter for screening mammogram for malignant neoplasm of breast: Secondary | ICD-10-CM

## 2018-12-06 DIAGNOSIS — E119 Type 2 diabetes mellitus without complications: Secondary | ICD-10-CM | POA: Diagnosis not present

## 2018-12-06 DIAGNOSIS — E785 Hyperlipidemia, unspecified: Secondary | ICD-10-CM | POA: Diagnosis not present

## 2018-12-06 DIAGNOSIS — C50919 Malignant neoplasm of unspecified site of unspecified female breast: Secondary | ICD-10-CM | POA: Diagnosis not present

## 2018-12-27 DIAGNOSIS — E119 Type 2 diabetes mellitus without complications: Secondary | ICD-10-CM | POA: Diagnosis not present

## 2018-12-27 DIAGNOSIS — I10 Essential (primary) hypertension: Secondary | ICD-10-CM | POA: Diagnosis not present

## 2018-12-27 DIAGNOSIS — C50919 Malignant neoplasm of unspecified site of unspecified female breast: Secondary | ICD-10-CM | POA: Diagnosis not present

## 2018-12-27 DIAGNOSIS — E785 Hyperlipidemia, unspecified: Secondary | ICD-10-CM | POA: Diagnosis not present

## 2019-01-04 DIAGNOSIS — C50919 Malignant neoplasm of unspecified site of unspecified female breast: Secondary | ICD-10-CM | POA: Diagnosis not present

## 2019-01-04 DIAGNOSIS — E119 Type 2 diabetes mellitus without complications: Secondary | ICD-10-CM | POA: Diagnosis not present

## 2019-01-04 DIAGNOSIS — I10 Essential (primary) hypertension: Secondary | ICD-10-CM | POA: Diagnosis not present

## 2019-01-04 DIAGNOSIS — E785 Hyperlipidemia, unspecified: Secondary | ICD-10-CM | POA: Diagnosis not present

## 2019-01-26 ENCOUNTER — Other Ambulatory Visit: Payer: Self-pay

## 2019-01-26 ENCOUNTER — Ambulatory Visit
Admission: RE | Admit: 2019-01-26 | Discharge: 2019-01-26 | Disposition: A | Discharge status: Home / Self Care | Payer: Medicare Other | Source: Ambulatory Visit | Attending: Family Medicine | Admitting: Family Medicine

## 2019-01-26 DIAGNOSIS — Z1231 Encounter for screening mammogram for malignant neoplasm of breast: Secondary | ICD-10-CM

## 2019-01-26 HISTORY — DX: Personal history of irradiation: Z92.3

## 2019-02-17 DIAGNOSIS — E119 Type 2 diabetes mellitus without complications: Secondary | ICD-10-CM | POA: Diagnosis not present

## 2019-02-17 DIAGNOSIS — R7301 Impaired fasting glucose: Secondary | ICD-10-CM | POA: Diagnosis not present

## 2019-02-17 DIAGNOSIS — M1990 Unspecified osteoarthritis, unspecified site: Secondary | ICD-10-CM | POA: Diagnosis not present

## 2019-02-17 DIAGNOSIS — R7309 Other abnormal glucose: Secondary | ICD-10-CM | POA: Diagnosis not present

## 2019-02-17 DIAGNOSIS — I10 Essential (primary) hypertension: Secondary | ICD-10-CM | POA: Diagnosis not present

## 2019-02-17 DIAGNOSIS — E785 Hyperlipidemia, unspecified: Secondary | ICD-10-CM | POA: Diagnosis not present

## 2019-02-17 DIAGNOSIS — G64 Other disorders of peripheral nervous system: Secondary | ICD-10-CM | POA: Diagnosis not present

## 2019-02-17 DIAGNOSIS — C50919 Malignant neoplasm of unspecified site of unspecified female breast: Secondary | ICD-10-CM | POA: Diagnosis not present

## 2019-02-17 DIAGNOSIS — Z23 Encounter for immunization: Secondary | ICD-10-CM | POA: Diagnosis not present

## 2019-03-03 DIAGNOSIS — I10 Essential (primary) hypertension: Secondary | ICD-10-CM | POA: Diagnosis not present

## 2019-03-03 DIAGNOSIS — E119 Type 2 diabetes mellitus without complications: Secondary | ICD-10-CM | POA: Diagnosis not present

## 2019-03-03 DIAGNOSIS — C50919 Malignant neoplasm of unspecified site of unspecified female breast: Secondary | ICD-10-CM | POA: Diagnosis not present

## 2019-03-03 DIAGNOSIS — G64 Other disorders of peripheral nervous system: Secondary | ICD-10-CM | POA: Diagnosis not present

## 2019-04-08 DIAGNOSIS — R768 Other specified abnormal immunological findings in serum: Secondary | ICD-10-CM | POA: Diagnosis not present

## 2019-04-08 DIAGNOSIS — E669 Obesity, unspecified: Secondary | ICD-10-CM | POA: Diagnosis not present

## 2019-04-08 DIAGNOSIS — M2550 Pain in unspecified joint: Secondary | ICD-10-CM | POA: Diagnosis not present

## 2019-04-08 DIAGNOSIS — Z6830 Body mass index (BMI) 30.0-30.9, adult: Secondary | ICD-10-CM | POA: Diagnosis not present

## 2019-05-05 DIAGNOSIS — E119 Type 2 diabetes mellitus without complications: Secondary | ICD-10-CM | POA: Diagnosis not present

## 2019-05-05 DIAGNOSIS — R7301 Impaired fasting glucose: Secondary | ICD-10-CM | POA: Diagnosis not present

## 2019-05-05 DIAGNOSIS — G64 Other disorders of peripheral nervous system: Secondary | ICD-10-CM | POA: Diagnosis not present

## 2019-05-05 DIAGNOSIS — I10 Essential (primary) hypertension: Secondary | ICD-10-CM | POA: Diagnosis not present

## 2019-05-05 DIAGNOSIS — Z23 Encounter for immunization: Secondary | ICD-10-CM | POA: Diagnosis not present

## 2019-07-25 ENCOUNTER — Ambulatory Visit: Payer: Medicare Other | Attending: Internal Medicine

## 2019-07-25 DIAGNOSIS — Z20822 Contact with and (suspected) exposure to covid-19: Secondary | ICD-10-CM | POA: Diagnosis not present

## 2019-07-26 LAB — NOVEL CORONAVIRUS, NAA: SARS-CoV-2, NAA: NOT DETECTED

## 2019-07-26 LAB — SARS-COV-2, NAA 2 DAY TAT

## 2019-09-12 DIAGNOSIS — Z6829 Body mass index (BMI) 29.0-29.9, adult: Secondary | ICD-10-CM | POA: Diagnosis not present

## 2019-09-12 DIAGNOSIS — M2550 Pain in unspecified joint: Secondary | ICD-10-CM | POA: Diagnosis not present

## 2019-09-12 DIAGNOSIS — R768 Other specified abnormal immunological findings in serum: Secondary | ICD-10-CM | POA: Diagnosis not present

## 2019-09-12 DIAGNOSIS — E663 Overweight: Secondary | ICD-10-CM | POA: Diagnosis not present

## 2020-02-14 DIAGNOSIS — Z0001 Encounter for general adult medical examination with abnormal findings: Secondary | ICD-10-CM | POA: Diagnosis not present

## 2020-02-14 DIAGNOSIS — I10 Essential (primary) hypertension: Secondary | ICD-10-CM | POA: Diagnosis not present

## 2020-02-14 DIAGNOSIS — G64 Other disorders of peripheral nervous system: Secondary | ICD-10-CM | POA: Diagnosis not present

## 2020-02-14 DIAGNOSIS — E119 Type 2 diabetes mellitus without complications: Secondary | ICD-10-CM | POA: Diagnosis not present

## 2020-02-15 DIAGNOSIS — E559 Vitamin D deficiency, unspecified: Secondary | ICD-10-CM | POA: Diagnosis not present

## 2020-02-15 DIAGNOSIS — R7301 Impaired fasting glucose: Secondary | ICD-10-CM | POA: Diagnosis not present

## 2020-02-15 DIAGNOSIS — I10 Essential (primary) hypertension: Secondary | ICD-10-CM | POA: Diagnosis not present

## 2020-02-15 DIAGNOSIS — Z7689 Persons encountering health services in other specified circumstances: Secondary | ICD-10-CM | POA: Diagnosis not present

## 2020-02-15 DIAGNOSIS — E785 Hyperlipidemia, unspecified: Secondary | ICD-10-CM | POA: Diagnosis not present

## 2020-02-21 DIAGNOSIS — G64 Other disorders of peripheral nervous system: Secondary | ICD-10-CM | POA: Diagnosis not present

## 2020-02-21 DIAGNOSIS — U071 COVID-19: Secondary | ICD-10-CM | POA: Diagnosis not present

## 2020-02-21 DIAGNOSIS — E119 Type 2 diabetes mellitus without complications: Secondary | ICD-10-CM | POA: Diagnosis not present

## 2020-02-21 DIAGNOSIS — Z0001 Encounter for general adult medical examination with abnormal findings: Secondary | ICD-10-CM | POA: Diagnosis not present

## 2020-02-21 DIAGNOSIS — I10 Essential (primary) hypertension: Secondary | ICD-10-CM | POA: Diagnosis not present

## 2020-03-06 ENCOUNTER — Other Ambulatory Visit: Payer: Self-pay | Admitting: Family Medicine

## 2020-03-06 DIAGNOSIS — Z1231 Encounter for screening mammogram for malignant neoplasm of breast: Secondary | ICD-10-CM

## 2020-03-07 ENCOUNTER — Other Ambulatory Visit: Payer: Self-pay

## 2020-03-07 ENCOUNTER — Ambulatory Visit
Admission: RE | Admit: 2020-03-07 | Discharge: 2020-03-07 | Disposition: A | Discharge status: Home / Self Care | Payer: Medicare Other | Source: Ambulatory Visit | Attending: Family Medicine | Admitting: Family Medicine

## 2020-03-07 DIAGNOSIS — Z1231 Encounter for screening mammogram for malignant neoplasm of breast: Secondary | ICD-10-CM | POA: Diagnosis not present

## 2020-05-15 ENCOUNTER — Emergency Department (HOSPITAL_COMMUNITY): Payer: Medicare Other

## 2020-05-15 ENCOUNTER — Telehealth: Payer: Medicare Other | Admitting: Family

## 2020-05-15 ENCOUNTER — Encounter (HOSPITAL_COMMUNITY): Payer: Self-pay | Admitting: *Deleted

## 2020-05-15 ENCOUNTER — Other Ambulatory Visit: Payer: Self-pay

## 2020-05-15 ENCOUNTER — Emergency Department (HOSPITAL_COMMUNITY)
Admission: EM | Admit: 2020-05-15 | Discharge: 2020-05-16 | Disposition: A | Discharge status: Home / Self Care | Payer: Medicare Other | Attending: Emergency Medicine | Admitting: Emergency Medicine

## 2020-05-15 DIAGNOSIS — U071 COVID-19: Secondary | ICD-10-CM

## 2020-05-15 DIAGNOSIS — Z79899 Other long term (current) drug therapy: Secondary | ICD-10-CM | POA: Diagnosis not present

## 2020-05-15 DIAGNOSIS — Z7984 Long term (current) use of oral hypoglycemic drugs: Secondary | ICD-10-CM | POA: Insufficient documentation

## 2020-05-15 DIAGNOSIS — Z853 Personal history of malignant neoplasm of breast: Secondary | ICD-10-CM | POA: Insufficient documentation

## 2020-05-15 DIAGNOSIS — R059 Cough, unspecified: Secondary | ICD-10-CM | POA: Diagnosis present

## 2020-05-15 DIAGNOSIS — E119 Type 2 diabetes mellitus without complications: Secondary | ICD-10-CM | POA: Insufficient documentation

## 2020-05-15 DIAGNOSIS — Z7982 Long term (current) use of aspirin: Secondary | ICD-10-CM | POA: Insufficient documentation

## 2020-05-15 DIAGNOSIS — Z87891 Personal history of nicotine dependence: Secondary | ICD-10-CM | POA: Insufficient documentation

## 2020-05-15 DIAGNOSIS — I10 Essential (primary) hypertension: Secondary | ICD-10-CM | POA: Insufficient documentation

## 2020-05-15 LAB — CBC
HCT: 43.2 % (ref 36.0–46.0)
Hemoglobin: 14.2 g/dL (ref 12.0–15.0)
MCH: 29.3 pg (ref 26.0–34.0)
MCHC: 32.9 g/dL (ref 30.0–36.0)
MCV: 89.3 fL (ref 80.0–100.0)
Platelets: 155 10*3/uL (ref 150–400)
RBC: 4.84 MIL/uL (ref 3.87–5.11)
RDW: 12.5 % (ref 11.5–15.5)
WBC: 4.2 10*3/uL (ref 4.0–10.5)
nRBC: 0 % (ref 0.0–0.2)

## 2020-05-15 LAB — BASIC METABOLIC PANEL
Anion gap: 13 (ref 5–15)
BUN: 13 mg/dL (ref 8–23)
CO2: 21 mmol/L — ABNORMAL LOW (ref 22–32)
Calcium: 8.4 mg/dL — ABNORMAL LOW (ref 8.9–10.3)
Chloride: 103 mmol/L (ref 98–111)
Creatinine, Ser: 1.11 mg/dL — ABNORMAL HIGH (ref 0.44–1.00)
GFR, Estimated: 54 mL/min — ABNORMAL LOW (ref >60)
Glucose, Bld: 107 mg/dL — ABNORMAL HIGH (ref 70–99)
Potassium: 3.5 mmol/L (ref 3.5–5.1)
Sodium: 137 mmol/L (ref 135–145)

## 2020-05-15 MED ORDER — ACETAMINOPHEN 325 MG PO TABS
650.0000 mg | ORAL_TABLET | Freq: Once | ORAL | Status: AC | PRN
  Administered 2020-05-15: 650 mg via ORAL
  Filled 2020-05-15: qty 2

## 2020-05-15 MED ORDER — PREDNISONE 10 MG (21) PO TBPK: ORAL_TABLET | ORAL | 0 refills | Status: DC

## 2020-05-15 MED ORDER — ALBUTEROL SULFATE HFA 108 (90 BASE) MCG/ACT IN AERS: 2.0000 | INHALATION_SPRAY | Freq: Four times a day (QID) | RESPIRATORY_TRACT | 0 refills | Status: AC | PRN

## 2020-05-15 MED ORDER — BENZONATATE 100 MG PO CAPS: 100.0000 mg | ORAL_CAPSULE | Freq: Three times a day (TID) | ORAL | 0 refills | Status: DC | PRN

## 2020-05-15 NOTE — ED Triage Notes (Signed)
Pt states she has had a cough since January 11 (COVID +). Pt says she feels dehydrated, has had a cough and fatigue.

## 2020-05-15 NOTE — Progress Notes (Signed)
E-Visit for Corona Virus Screening  We are sorry you are not feeling well. We are here to help!  You have tested positive for COVID-19, meaning that you were infected with the novel coronavirus and could give the virus to others.  It is vitally important that you stay home so you do not spread it to others.      Please continue isolation at home, for at least 10 days since the start of your symptoms and until you have had 24 hours with no fever (without taking a fever reducer) and with improving of symptoms.  If you have no symptoms but tested positive (or all symptoms resolve after 5 days and you have no fever) you can leave your house but continue to wear a mask around others for an additional 5 days. If you have a fever,continue to stay home until you have had 24 hours of no fever. Most cases improve 5-10 days from onset but we have seen a small number of patients who have gotten worse after the 10 days.  Please be sure to watch for worsening symptoms and remain taking the proper precautions.   Go to the nearest hospital ED for assessment if fever/cough/breathlessness are severe or illness seems like a threat to life.    The following symptoms may appear 2-14 days after exposure: . Fever . Cough . Shortness of breath or difficulty breathing . Chills . Repeated shaking with chills . Muscle pain . Headache . Sore throat . New loss of taste or smell . Fatigue . Congestion or runny nose . Nausea or vomiting . Diarrhea  You have been enrolled in [place] for COVID-19. Daily you will receive a questionnaire within the [facility] website. Our COVID-19 response team will be monitoring your responses daily.  You can use medication such as A prescription cough medication called Tessalon Perles 100 mg. You may take 1-2 capsules every 8 hours as needed for cough and A prescription inhaler called Albuterol MDI 90 mcg /actuation 2 puffs every 4 hours as needed for shortness of breath,  wheezing, cough and prednisone dose pack. I recommend you be seen face to face by Urgent Care today or tomorrow for chest x-ray.      You may also take acetaminophen (Tylenol) as needed for fever.  HOME CARE: . Only take medications as instructed by your medical team. . Drink plenty of fluids and get plenty of rest. . A steam or ultrasonic humidifier can help if you have congestion.   GET HELP RIGHT AWAY IF YOU HAVE EMERGENCY WARNING SIGNS.  Call 911 or proceed to your closest emergency facility if: . You develop worsening high fever. . Trouble breathing . Bluish lips or face . Persistent pain or pressure in the chest . New confusion . Inability to wake or stay awake . You cough up blood. . Your symptoms become more severe . Inability to hold down food or fluids  This list is not all possible symptoms. Contact your medical provider for any symptoms that are severe or concerning to you.    Your e-visit answers were reviewed by a board certified advanced clinical practitioner to complete your personal care plan.  Depending on the condition, your plan could have included both over the counter or prescription medications.  If there is a problem please reply once you have received a response from your provider.  Your safety is important to [place].  If you have drug allergies check your prescription carefully.  You can use MyChart to ask questions about today's visit, request a non-urgent call back, or ask for a work or school excuse for 24 hours related to this e-Visit. If it has been greater than 24 hours you will need to follow up with your provider, or enter a new e-Visit to address those concerns. You will get an e-mail in the next two days asking about your experience.  I hope that your e-visit has been valuable and will speed your recovery. Thank you for using e-visits.     Approximately 5 minutes was spent documenting and reviewing patient's chart.

## 2020-05-16 MED ORDER — SODIUM CHLORIDE 0.9 % IV BOLUS
1000.0000 mL | Freq: Once | INTRAVENOUS | Status: AC
  Administered 2020-05-16: 1000 mL via INTRAVENOUS

## 2020-05-16 MED ORDER — GUAIFENESIN-CODEINE 100-10 MG/5ML PO SOLN: 10.0000 mL | Freq: Four times a day (QID) | ORAL | 0 refills | Status: DC | PRN

## 2020-05-16 MED ORDER — SODIUM CHLORIDE 0.9 % IV BOLUS: 1000.0000 mL | Freq: Once | INTRAVENOUS | Status: DC

## 2020-05-16 NOTE — ED Provider Notes (Signed)
[facility] EMERGENCY DEPARTMENT Provider Note   CSN: [id number] Arrival date & time: 05/15/20  1812     History Chief Complaint  Patient presents with  . Cough    Rachael Weaver is a 70 y.o. female.  Patient is a 70 year old female with history of diabetes, hypertension, breast cancer.  Patient presents today for evaluation of cough.  She has been sick for the past 9 days.  She had a positive COVID test 1 week ago.  She received the monoclonal antibody infusion at her doctor's office 1 week ago.  For the past several days.  She reports feeling tired, having no energy, having no appetite, and little p.o. intake.  She feels that she is dehydrated.  She reports persistent cough that is nonproductive.  The history is provided by the patient.       Past Medical History:  Diagnosis Date  . Allergy    seasonal /rag weed  . Anxiety   . Arthritis    hands feet- "aching all over from Tamoxifen use.  . Breast cancer [facility])    right breast-surgery 2013, chemo, radiation 11'13 completed.- Mr Magrinat LOV [phone]  . Cancer ([place])   . Cataract    no surgery yet  . Depression 05/03/2013  . DES exposure in utero   . GERD (gastroesophageal reflux disease)   . Headache(784.0)    USES OTC MEDS- no recent problems  . HSV-2 infection    H/O  . Hypertension    borderline- no meds  . Lymph edema    intermittent right arm- presently okay 01-24-16  . Maintenance chemotherapy following disease   . Personal history of radiation therapy   . Seasonal allergies    uses OTC as needed    Patient Active Problem List   Diagnosis Date Noted  . Elevated triglycerides with high cholesterol 10/14/2017  . Elevated hemoglobin A1c 10/14/2017  . Uncontrolled type 2 diabetes mellitus with hyperglycemia ([place]) 10/14/2017  . Hot flashes 10/18/2014  . Neuropathy 06/09/2014  . Depression 05/03/2013  . HTN (hypertension) 08/23/2012  . Allergy   . Breast cancer ([place]) 08/26/2011  .  Malignant neoplasm of upper-outer quadrant of right breast in female, estrogen receptor positive ([place]) 07/24/2011  . Allergy history unknown 07/10/2011    Past Surgical History:  Procedure Laterality Date  . BREAST LUMPECTOMY  08/21/11   right  . CESAREAN SECTION    . COLONOSCOPY WITH PROPOFOL N/A 01/29/2016   Procedure: COLONOSCOPY WITH PROPOFOL;  Surgeon: Garlan Fair, MD;  Location: [facility];  Service: Endoscopy;  Laterality: N/A;  . PORT-A-CATH REMOVAL N/A 04/13/2013   Procedure: MINOR REMOVAL PORT-A-CATH;  Surgeon: Haywood Lasso, MD;  Location: [facility] [place];  Service: General;  Laterality: N/A;  . PORTACATH PLACEMENT  08/21/2011   Procedure: INSERTION PORT-A-CATH;  Surgeon: Haywood Lasso, MD;  Location: [place];  Service: General;  Laterality: Left;     OB History    Gravida  6   Para  1   Term  1   Preterm      AB  5   Living  1     SAB  3   IAB  2   Ectopic      Multiple      Live Births  1           Family History  Problem Relation Age of Onset  . Diabetes Paternal Grandfather   . Heart disease Paternal  Grandfather   . Hypertension Father   . Stroke Father   . Cancer Cousin 40       cervical or ovarian  . Breast cancer Maternal Grandmother   . Cancer Maternal Grandmother        breast  . Breast cancer Maternal Aunt   . Cancer Maternal Aunt        breast    Social History   Tobacco Use  . Smoking status: Former Smoker    Packs/day: 1.00    Types: Cigarettes    Quit date: 04/29/1971    Years since quitting: 49.0  . Smokeless tobacco: Never Used  Vaping Use  . Vaping Use: Never used  Substance Use Topics  . Alcohol use: Yes    Alcohol/week: 0.0 - 1.0 standard drinks    Comment: rarely social glass wine  . Drug use: No    Home Medications Prior to Admission medications   Medication Sig Start Date End Date Taking? Authorizing Provider  albuterol (VENTOLIN HFA) 108 (90 Base) MCG/ACT  inhaler Inhale 2 puffs into the lungs every 6 (six) hours as needed for wheezing or shortness of breath. 05/15/20   Sharion Balloon, FNP  aspirin-acetaminophen-caffeine (EXCEDRIN MIGRAINE) [phone] MG per tablet Take 1 tablet by mouth as needed.    [provider]  benzonatate (TESSALON PERLES) 100 MG capsule Take 1 capsule (100 mg total) by mouth 3 (three) times daily as needed. 05/15/20   Evelina Dun A, FNP  fexofenadine (ALLEGRA) 30 MG tablet Take 30 mg by mouth as needed.    [provider]  lansoprazole (PREVACID) 15 MG capsule Take 15 mg by mouth daily at 12 noon. As needed for acid reflux    [provider]  metFORMIN (GLUCOPHAGE-XR) 500 MG 24 hr tablet Take 1 tablet (500 mg total) by mouth 2 (two) times daily. 03/16/18   Tenna Delaine D, PA-C  olmesartan (BENICAR) 20 MG tablet Take 1 tablet (20 mg total) by mouth daily. Patient needs office visit for more refills 03/16/18   Tenna Delaine D, PA-C  predniSONE (STERAPRED UNI-PAK 21 TAB) 10 MG (21) TBPK tablet Use as directed 05/15/20   Evelina Dun A, FNP  rosuvastatin (CRESTOR) 20 MG tablet Take 1 tablet (20 mg total) by mouth daily. 03/16/18   Tenna Delaine D, PA-C    Allergies    Patient has no known allergies.  Review of Systems   Review of Systems  All other systems reviewed and are negative.   Physical Exam Updated Vital Signs BP (!) 118/53   Pulse 91   Temp 98.6 F (37 C) (Oral)   Resp (!) 25   LMP 04/29/2003 (Approximate)   SpO2 96%   Physical Exam Vitals and nursing note reviewed.  Constitutional:      General: She is not in acute distress.    Appearance: She is well-developed and well-nourished. She is not diaphoretic.  HENT:     Head: Normocephalic and atraumatic.     Mouth/Throat:     Mouth: Mucous membranes are moist.  Cardiovascular:     Rate and Rhythm: Normal rate and regular rhythm.     Heart sounds: No murmur heard. No friction rub. No gallop.   Pulmonary:      Effort: Pulmonary effort is normal. No respiratory distress.     Breath sounds: Normal breath sounds. No wheezing.  Abdominal:     General: Bowel sounds are normal. There is no distension.     Palpations: Abdomen is  soft.     Tenderness: There is no abdominal tenderness.  Musculoskeletal:        General: Normal range of motion.     Cervical back: Normal range of motion and neck supple.  Skin:    General: Skin is warm and dry.  Neurological:     Mental Status: She is alert and oriented to person, place, and time.     ED Results / Procedures / Treatments   Labs (all labs ordered are listed, but only abnormal results are displayed) Labs Reviewed  BASIC METABOLIC PANEL - Abnormal; Notable for the following components:      Result Value   CO2 21 (*)    Glucose, Bld 107 (*)    Creatinine, Ser 1.11 (*)    Calcium 8.4 (*)    GFR, Estimated 54 (*)    All other components within normal limits  CBC    EKG None  Radiology DG Chest Portable 1 View  Result Date: 05/15/2020 CLINICAL DATA:  cough COVID positive EXAM: PORTABLE CHEST 1 VIEW COMPARISON:  For Sep 16, 2011 FINDINGS: The heart size and mediastinal contours are within normal limits. Both lungs are clear. The visualized skeletal structures are unremarkable. IMPRESSION: No active disease. Electronically Signed   By: Prudencio Pair M.D.   On: 05/15/2020 20:31    Procedures Procedures (including critical care time)  Medications Ordered in ED Medications  sodium chloride 0.9 % bolus 1,000 mL (has no administration in time range)  acetaminophen (TYLENOL) tablet 650 mg (650 mg Oral Given 05/15/20 1937)    ED Course  I have reviewed the triage vital signs and the nursing notes.  Pertinent labs & imaging results that were available during my care of the patient were reviewed by me and considered in my medical decision making (see chart for details).    MDM Rules/Calculators/A&P  Patient with COVID diagnosed in the past week  presenting with cough, weakness, and decreased p.o. intake.  She arrives here afebrile with stable vital signs and no hypoxia.    Patient's physical examination, General appearance, and laboratory studies are reassuring.  She was given 1 L of normal saline and seems to be feeling better.  Patient is interested in potentially receiving monoclonal antibody.  A referral has been placed to the infusion clinic.  She will be prescribed medication for cough as requested.  She is to return as needed for any problems.  Final Clinical Impression(s) / ED Diagnoses Final diagnoses:  None    Rx / DC Orders ED Discharge Orders    None       Veryl Speak, MD 05/16/20 [phone]

## 2020-05-16 NOTE — Discharge Instructions (Signed)
Begin taking Robitussin with codeine as prescribed as needed for cough.  Drink plenty of fluids and get plenty of rest.  Referral has been placed to the infusion clinic for possible monoclonal antibody.  They will contact you if you qualify and there are sufficient quantities of the medication.  Return to the emergency department if you develop severe chest pain, difficulty breathing, high fever, or other new and concerning symptoms.

## 2020-07-24 ENCOUNTER — Encounter (INDEPENDENT_AMBULATORY_CARE_PROVIDER_SITE_OTHER): Payer: Self-pay

## 2020-07-25 ENCOUNTER — Other Ambulatory Visit: Payer: Self-pay

## 2020-07-25 ENCOUNTER — Ambulatory Visit (INDEPENDENT_AMBULATORY_CARE_PROVIDER_SITE_OTHER): Payer: Medicare Other

## 2020-07-25 ENCOUNTER — Ambulatory Visit (INDEPENDENT_AMBULATORY_CARE_PROVIDER_SITE_OTHER): Payer: Medicare Other | Admitting: Podiatry

## 2020-07-25 DIAGNOSIS — M778 Other enthesopathies, not elsewhere classified: Secondary | ICD-10-CM

## 2020-07-25 DIAGNOSIS — M79671 Pain in right foot: Secondary | ICD-10-CM

## 2020-07-25 DIAGNOSIS — M79672 Pain in left foot: Secondary | ICD-10-CM

## 2020-07-26 ENCOUNTER — Encounter: Payer: Self-pay | Admitting: Podiatry

## 2020-07-26 ENCOUNTER — Other Ambulatory Visit: Payer: Self-pay | Admitting: Podiatry

## 2020-07-26 DIAGNOSIS — M778 Other enthesopathies, not elsewhere classified: Secondary | ICD-10-CM

## 2020-07-26 NOTE — Progress Notes (Signed)
Subjective:  Patient ID: Rachael Weaver, female    DOB: 09-21-1950,  MRN: [id number]  Chief Complaint  Patient presents with  . Foot Pain    Bilateral foot pain pain is on the top of feet and constant feels like a burning sensation    70 y.o. female presents with the above complaint.  Patient presents with complaint of bilateral extensor tendon noticed.  Patient states gotten worse within the last year.  They are sometimes now burning pain on top of the foot.  She states that warm baths helps with the pain.  The pain is always constant.  Is dull achy in nature.  She is tried various different conservative treatment options none of which has helped.  She would like to discuss other treatment options that I well before this.  She states that the left is slightly greater than right side but they hurt about the same.  Pain scale 7 out of 10.   Review of Systems: Negative except as noted in the HPI. Denies N/V/F/Ch.  Past Medical History:  Diagnosis Date  . Allergy    seasonal /rag weed  . Anxiety   . Arthritis    hands feet- "aching all over from Tamoxifen use.  . Breast cancer [facility])    right breast-surgery 2013, chemo, radiation 11'13 completed.- Mr Magrinat LOV [phone]  . Cancer ([place])   . Cataract    no surgery yet  . Depression 05/03/2013  . DES exposure in utero   . GERD (gastroesophageal reflux disease)   . Headache(784.0)    USES OTC MEDS- no recent problems  . HSV-2 infection    H/O  . Hypertension    borderline- no meds  . Lymph edema    intermittent right arm- presently okay 01-24-16  . Maintenance chemotherapy following disease   . Personal history of radiation therapy   . Seasonal allergies    uses OTC as needed    Current Outpatient Medications:  .  albuterol (VENTOLIN HFA) 108 (90 Base) MCG/ACT inhaler, Inhale 2 puffs into the lungs every 6 (six) hours as needed for wheezing or shortness of breath., Disp: 8 g, Rfl: 0 .  aspirin-acetaminophen-caffeine (EXCEDRIN  MIGRAINE) 250-250-65 MG per tablet, Take 1 tablet by mouth as needed., Disp: , Rfl:  .  benzonatate (TESSALON PERLES) 100 MG capsule, Take 1 capsule (100 mg total) by mouth 3 (three) times daily as needed., Disp: 20 capsule, Rfl: 0 .  fexofenadine (ALLEGRA) 30 MG tablet, Take 30 mg by mouth as needed., Disp: , Rfl:  .  guaiFENesin-codeine 100-10 MG/5ML syrup, Take 10 mLs by mouth every 6 (six) hours as needed for cough., Disp: 120 mL, Rfl: 0 .  lansoprazole (PREVACID) 15 MG capsule, Take 15 mg by mouth daily at 12 noon. As needed for acid reflux, Disp: , Rfl:  .  metFORMIN (GLUCOPHAGE-XR) 500 MG 24 hr tablet, Take 1 tablet (500 mg total) by mouth 2 (two) times daily., Disp: 180 tablet, Rfl: 1 .  olmesartan (BENICAR) 20 MG tablet, Take 1 tablet (20 mg total) by mouth daily. Patient needs office visit for more refills, Disp: 90 tablet, Rfl: 1 .  predniSONE (STERAPRED UNI-PAK 21 TAB) 10 MG (21) TBPK tablet, Use as directed, Disp: 21 tablet, Rfl: 0 .  rosuvastatin (CRESTOR) 20 MG tablet, Take 1 tablet (20 mg total) by mouth daily., Disp: 90 tablet, Rfl: 1  Social History   Tobacco Use  Smoking Status Former Smoker  . Packs/day: 1.00  . Types:  Cigarettes  . Quit date: 04/29/1971  . Years since quitting: 49.2  Smokeless Tobacco Never Used    No Known Allergies Objective:  There were no vitals filed for this visit. There is no height or weight on file to calculate BMI. Constitutional Well developed. Well nourished.  Vascular Dorsalis pedis pulses palpable bilaterally. Posterior tibial pulses palpable bilaterally. Capillary refill normal to all digits.  No cyanosis or clubbing noted. Pedal hair growth normal.  Neurologic Normal speech. Oriented to person, place, and time. Epicritic sensation to light touch grossly present bilaterally.  Dermatologic Nails well groomed and normal in appearance. No open wounds. No skin lesions.  Orthopedic:  Pain on palpation to the dorsal aspect of the foot  bilaterally.  Pain with resisted dorsiflexion of the digit.  No pain with resisted plantarflexion of the digits.  No pain at the Lisfranc interval.  No pain with range of motion of the metatarsophalangeal joint 1 through 5.   Radiographs: 3 views of skeletally mature adult bilateral foot: Pes planovalgus foot structure noted with plantar and posterior heel spurring midfoot arthritis noted.  Arthritic changes noted at the first metatarsophalangeal joint as well.  These findings are consistent bilaterally. Assessment:   1. Pain in both feet   2. Extensor tendinitis of foot    Plan:  Patient was evaluated and treated and all questions answered.  Bilateral extensor tendinitis left slightly greater than right -I explained to the patient the etiology of tendinitis and various treatment options were extensively discussed.  Given that this is a bilateral inflammation of the tendon I believe patient will benefit from cam boot immobilization on the left side since it tends to be slightly more painful and Tri-Lock ankle brace on the right side.  I encouraged her to utilize this at all times when she is ambulating.  Patient states understanding will use both the DME's.  If there is no resolve meant will discuss steroid injection versus MRI of the worse foot.  Patient states understanding.  This could also be attributed to the arthritis leading to aggravation of the nerve and therefore leading to numbness.  I discussed all my clinical and radiographical findings with the patient extended due to she states understanding -Cam boot was dispensed and Tri-Lock ankle brace was dispensed  No follow-ups on file.

## 2020-08-08 ENCOUNTER — Ambulatory Visit (INDEPENDENT_AMBULATORY_CARE_PROVIDER_SITE_OTHER): Payer: Medicare Other | Admitting: Ophthalmology

## 2020-08-08 ENCOUNTER — Other Ambulatory Visit: Payer: Self-pay

## 2020-08-08 ENCOUNTER — Encounter (INDEPENDENT_AMBULATORY_CARE_PROVIDER_SITE_OTHER): Payer: Self-pay | Admitting: Ophthalmology

## 2020-08-08 DIAGNOSIS — H2513 Age-related nuclear cataract, bilateral: Secondary | ICD-10-CM

## 2020-08-08 DIAGNOSIS — H43811 Vitreous degeneration, right eye: Secondary | ICD-10-CM | POA: Diagnosis not present

## 2020-08-08 DIAGNOSIS — H43822 Vitreomacular adhesion, left eye: Secondary | ICD-10-CM | POA: Diagnosis not present

## 2020-08-08 DIAGNOSIS — E1165 Type 2 diabetes mellitus with hyperglycemia: Secondary | ICD-10-CM

## 2020-08-08 NOTE — Assessment & Plan Note (Signed)
Minor OU follow-up with Dr. Carolynn Sayers as scheduled

## 2020-08-08 NOTE — Assessment & Plan Note (Signed)
The nature of posterior vitreous detachment was discussed with the patient as well as its physiology, its age prevalence, and its possible implication regarding retinal breaks and detachment.  An informational brochure was given to the patient.  All the patient's questions were answered.  The patient was asked to return if new or different flashes or floaters develops.   Patient was instructed to contact office immediately if any changes were noticed. I explained to the patient that vitreous inside the eye is similar to jello inside a bowl. As the jello melts it can start to pull away from the bowl, similarly the vitreous throughout our lives can begin to pull away from the retina. That process is called a posterior vitreous detachment. In some cases, the vitreous can tug hard enough on the retina to form a retinal tear. I discussed with the patient the signs and symptoms of a retinal detachment.  Do not rub the eye.  OD

## 2020-08-08 NOTE — Assessment & Plan Note (Signed)
Vitreomacular traction may cause vision loss from anatomic distortion to the center of the vision, the macula.  If visual function is symptomatic or threatened, therapy may be needed.  Surgical intervention offers the highest chance of visual stability and improvement.  Distortion of the macula anatomy may cause splitting of the retinal layers, termed foveomacular retinoschisis, which can cause more permanent vision loss.  Epiretinal membranes may also be associated.  Macular hole may also develop if vitreomacular traction progresses. The minor form of this condition is Vitreomacular adhesion, which is a natural change in the aging process of the eye, which requires observation only.,  Minor inner foveal distortion the temporal edge of the foveal depression.  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

## 2020-08-08 NOTE — Progress Notes (Signed)
08/08/2020     CHIEF COMPLAINT Patient presents for Eye Problem (NP distortion OS//Pt states, "I have had a lot of floaters in the past but I have a new on in my central VA OS. It moves around but when I am trying to read the top on the floater will distort the letters and the bottom of it will completely block out the letters."//A1C: 5.7/LBS: 90 (a few eeks ago)) and Blurred Vision   HISTORY OF PRESENT ILLNESS: Rachael Weaver is a 70 y.o. female who presents to the clinic today for:   HPI    Eye Problem     Additional comments: NP distortion OS  Pt states, "I have had a lot of floaters in the past but I have a new on in my central VA OS. It moves around but when I am trying to read the top on the floater will distort the letters and the bottom of it will completely block out the letters."  A1C: 5.7 LBS: 90 (a few eeks ago)          Blurred Vision    In left eye.  Onset was sudden.  Vision is with a spot.  Severity is moderate.  This started 2 months ago.  Occurring constantly.  It is worse throughout the day.  Since onset it is stable.  Treatments tried include no treatments.       Last edited by Kendra Opitz, COA on 08/08/2020  9:50 AM. (History)      Referring physician: Hayden Rasmussen, MD [place],  [place] [address]  HISTORICAL INFORMATION:   Selected notes from the MEDICAL RECORD NUMBER    Lab Results  Component Value Date   HGBA1C 7.5 (A) 03/16/2018     CURRENT MEDICATIONS: No current outpatient medications on file. (Ophthalmic Drugs)   No current facility-administered medications for this visit. (Ophthalmic Drugs)   Current Outpatient Medications (Other)  Medication Sig  . albuterol (VENTOLIN HFA) 108 (90 Base) MCG/ACT inhaler Inhale 2 puffs into the lungs every 6 (six) hours as needed for wheezing or shortness of breath.  Marland Kitchen aspirin-acetaminophen-caffeine (EXCEDRIN MIGRAINE) 250-250-65 MG per tablet Take 1 tablet by mouth as  needed.  . benzonatate (TESSALON PERLES) 100 MG capsule Take 1 capsule (100 mg total) by mouth 3 (three) times daily as needed.  . fexofenadine (ALLEGRA) 30 MG tablet Take 30 mg by mouth as needed.  Marland Kitchen guaiFENesin-codeine 100-10 MG/5ML syrup Take 10 mLs by mouth every 6 (six) hours as needed for cough.  . lansoprazole (PREVACID) 15 MG capsule Take 15 mg by mouth daily at 12 noon. As needed for acid reflux  . metFORMIN (GLUCOPHAGE-XR) 500 MG 24 hr tablet Take 1 tablet (500 mg total) by mouth 2 (two) times daily.  Marland Kitchen olmesartan (BENICAR) 20 MG tablet Take 1 tablet (20 mg total) by mouth daily. Patient needs office visit for more refills  . predniSONE (STERAPRED UNI-PAK 21 TAB) 10 MG (21) TBPK tablet Use as directed  . rosuvastatin (CRESTOR) 20 MG tablet Take 1 tablet (20 mg total) by mouth daily.   No current facility-administered medications for this visit. (Other)      REVIEW OF SYSTEMS:    ALLERGIES No Known Allergies  PAST MEDICAL HISTORY Past Medical History:  Diagnosis Date  . Allergy    seasonal /rag weed  . Anxiety   . Arthritis    hands feet- "aching all over from Tamoxifen use.  . Breast  cancer [facility])    right breast-surgery 2013, chemo, radiation 11'13 completed.- Mr Magrinat LOV [phone]  . Cancer ([place])   . Cataract    no surgery yet  . Depression 05/03/2013  . DES exposure in utero   . GERD (gastroesophageal reflux disease)   . Headache(784.0)    USES OTC MEDS- no recent problems  . HSV-2 infection    H/O  . Hypertension    borderline- no meds  . Lymph edema    intermittent right arm- presently okay 01-24-16  . Maintenance chemotherapy following disease   . Personal history of radiation therapy   . Seasonal allergies    uses OTC as needed   Past Surgical History:  Procedure Laterality Date  . BREAST LUMPECTOMY  08/21/11   right  . CESAREAN SECTION    . COLONOSCOPY WITH PROPOFOL N/A 01/29/2016   Procedure: COLONOSCOPY WITH PROPOFOL;  Surgeon: Garlan Fair,  MD;  [facility];  Service: Endoscopy;  Laterality: N/A;  . PORT-A-CATH REMOVAL N/A 04/13/2013   Procedure: MINOR REMOVAL PORT-A-CATH;  Surgeon: Haywood Lasso, MD;  Location: [place];  Service: General;  Laterality: N/A;  . PORTACATH PLACEMENT  08/21/2011   Procedure: INSERTION PORT-A-CATH;  Surgeon: Haywood Lasso, MD;  Location: [place];  Service: General;  Laterality: Left;    FAMILY HISTORY Family History  Problem Relation Age of Onset  . Diabetes Paternal Grandfather   . Heart disease Paternal Grandfather   . Hypertension Father   . Stroke Father   . Cancer Cousin 40       cervical or ovarian  . Breast cancer Maternal Grandmother   . Cancer Maternal Grandmother        breast  . Breast cancer Maternal Aunt   . Cancer Maternal Aunt        breast    SOCIAL HISTORY Social History   Tobacco Use  . Smoking status: Former Smoker    Packs/day: 1.00    Types: Cigarettes    Quit date: 04/29/1971    Years since quitting: 49.3  . Smokeless tobacco: Never Used  Vaping Use  . Vaping Use: Never used  Substance Use Topics  . Alcohol use: Yes    Alcohol/week: 0.0 - 1.0 standard drinks    Comment: rarely social glass wine  . Drug use: No         OPHTHALMIC EXAM: Base Eye Exam    Visual Acuity (ETDRS)      Right Left   Dist [place] 20/30 20/30   Dist ph [place] 20/25 NI       Tonometry (Tonopen, 9:54 AM)      Right Left   Pressure 17 15       Pupils      Pupils Dark Light Shape React APD   Right PERRL 4 3 Round Brisk None   Left PERRL 4 3 Round Brisk None       Visual Fields (Counting fingers)      Left Right    Full Full       Extraocular Movement      Right Left    Full Full       Neuro/Psych    Oriented x3: Yes       Dilation    Both eyes: 1.0% Mydriacyl, 2.5% Phenylephrine @ 9:54 AM        Slit Lamp and Fundus Exam    External Exam      Right Left  External Normal Normal       Slit Lamp Exam       Right Left   Lids/Lashes Normal Normal   Conjunctiva/Sclera White and quiet White and quiet   Cornea Clear Clear   Anterior Chamber Deep and quiet Deep and quiet   Iris Round and reactive Round and reactive   Lens 1+ Nuclear sclerosis 1+ Nuclear sclerosis   Anterior Vitreous Normal Normal       Fundus Exam      Right Left   Posterior Vitreous Posterior vitreous detachment Central vitreous floaters   Disc Normal Normal   C/D Ratio 0.45 0.35   Macula Normal,   Normal   Vessels Normal, , no DR Normal, , no DR   Periphery Normal Normal          IMAGING AND PROCEDURES  Imaging and Procedures for 08/08/20  OCT, Retina - OU - Both Eyes       Right Eye Quality was good. Central Foveal Thickness: 270. Progression has been stable. Findings include normal foveal contour, epiretinal membrane.   Left Eye Quality was good. Scan locations included subfoveal. Central Foveal Thickness: 268. Progression has been stable. Findings include vitreomacular adhesion .   Notes OS, with what may be residual vitreomacular adhesion near the perifoveal region yet mostly nearly complete PVD OS.  OS with very minor inner foveal elevation likely from vitreomacular adhesion likely to complete to a full PVD in the near future  OD with PVD, nondistorting epiretinal membrane temporally.                  ASSESSMENT/PLAN:  Vitreomacular traction syndrome of left eye Vitreomacular traction may cause vision loss from anatomic distortion to the center of the vision, the macula.  If visual function is symptomatic or threatened, therapy may be needed.  Surgical intervention offers the highest chance of visual stability and improvement.  Distortion of the macula anatomy may cause splitting of the retinal layers, termed foveomacular retinoschisis, which can cause more permanent vision loss.  Epiretinal membranes may also be associated.  Macular hole may also develop if vitreomacular traction  progresses. The minor form of this condition is Vitreomacular adhesion, which is a natural change in the aging process of the eye, which requires observation only.,  Minor inner foveal distortion the temporal edge of the foveal depression.  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  Posterior vitreous detachment of right eye   The nature of posterior vitreous detachment was discussed with the patient as well as its physiology, its age prevalence, and its possible implication regarding retinal breaks and detachment.  An informational brochure was given to the patient.  All the patient's questions were answered.  The patient was asked to return if new or different flashes or floaters develops.   Patient was instructed to contact office immediately if any changes were noticed. I explained to the patient that vitreous inside the eye is similar to jello inside a bowl. As the jello melts it can start to pull away from the bowl, similarly the vitreous throughout our lives can begin to pull away from the retina. That process is called a posterior vitreous detachment. In some cases, the vitreous can tug hard enough on the retina to form a retinal tear. I discussed with the patient the signs and symptoms of a retinal detachment.  Do not rub the eye.  OD  Nuclear sclerotic cataract of both eyes Minor OU follow-up with  Dr. Carolynn Sayers as scheduled  Uncontrolled type 2 diabetes mellitus with hyperglycemia ([place]) No detectable diabetic retinopathy at this time      ICD-10-CM   1. Uncontrolled type 2 diabetes mellitus with hyperglycemia (HCC)  E11.65 OCT, Retina - OU - Both Eyes  2. Vitreomacular traction syndrome of left eye  H43.822   3. Posterior vitreous detachment of right eye  H43.811   4. Nuclear sclerotic cataract of both eyes  H25.13     1.  OS with new onset symptoms likely referable to vitreomacular adhesion, traction syndrome on the inner perifoveal region as a PVD is likely  progression to a full PVD PVD in the near future  2.  No detectable diabetic retinopathy OU  3.  Ophthalmic Meds Ordered this visit:  No orders of the defined types were placed in this encounter.      Return in about 4 months (around 12/08/2020) for dilate, OS, OCT.  There are no Patient Instructions on file for this visit.   Explained the diagnoses, plan, and follow up with the patient and they expressed understanding.  Patient expressed understanding of the importance of proper follow up care.   Clent Demark Micha Dosanjh M.D. Diseases & Surgery of the Retina and Vitreous Retina & Diabetic [place] 08/08/20     Abbreviations: M myopia (nearsighted); A astigmatism; H hyperopia (farsighted); P presbyopia; Mrx spectacle prescription;  CTL contact lenses; OD right eye; OS left eye; OU both eyes  XT exotropia; ET esotropia; PEK punctate epithelial keratitis; PEE punctate epithelial erosions; DES dry eye syndrome; MGD meibomian gland dysfunction; ATs artificial tears; PFAT's preservative free artificial tears; [place] nuclear sclerotic cataract; PSC posterior subcapsular cataract; ERM epi-retinal membrane; PVD posterior vitreous detachment; RD retinal detachment; DM diabetes mellitus; DR diabetic retinopathy; NPDR non-proliferative diabetic retinopathy; PDR proliferative diabetic retinopathy; CSME clinically significant macular edema; DME diabetic macular edema; dbh dot blot hemorrhages; CWS cotton wool spot; POAG primary open angle glaucoma; C/D cup-to-disc ratio; HVF humphrey visual field; GVF goldmann visual field; OCT optical coherence tomography; IOP intraocular pressure; BRVO Branch retinal vein occlusion; CRVO central retinal vein occlusion; CRAO central retinal artery occlusion; BRAO branch retinal artery occlusion; RT retinal tear; SB scleral buckle; PPV pars plana vitrectomy; VH Vitreous hemorrhage; PRP panretinal laser photocoagulation; IVK intravitreal kenalog; VMT vitreomacular traction; MH  Macular hole;  NVD neovascularization of the disc; NVE neovascularization elsewhere; AREDS age related eye disease study; ARMD age related macular degeneration; POAG primary open angle glaucoma; EBMD epithelial/anterior basement membrane dystrophy; ACIOL anterior chamber intraocular lens; IOL intraocular lens; PCIOL posterior chamber intraocular lens; Phaco/IOL phacoemulsification with intraocular lens placement; [place] photorefractive keratectomy; LASIK laser assisted in situ keratomileusis; HTN hypertension; DM diabetes mellitus; COPD chronic obstructive pulmonary disease

## 2020-08-08 NOTE — Assessment & Plan Note (Signed)
No detectable diabetic retinopathy at this time

## 2020-08-22 ENCOUNTER — Ambulatory Visit: Payer: Medicare Other | Admitting: Podiatry

## 2020-12-12 ENCOUNTER — Other Ambulatory Visit: Payer: Self-pay

## 2020-12-12 ENCOUNTER — Encounter (INDEPENDENT_AMBULATORY_CARE_PROVIDER_SITE_OTHER): Payer: Self-pay | Admitting: Ophthalmology

## 2020-12-12 ENCOUNTER — Ambulatory Visit (INDEPENDENT_AMBULATORY_CARE_PROVIDER_SITE_OTHER): Payer: Medicare Other | Admitting: Ophthalmology

## 2020-12-12 DIAGNOSIS — E1165 Type 2 diabetes mellitus with hyperglycemia: Secondary | ICD-10-CM | POA: Diagnosis not present

## 2020-12-12 DIAGNOSIS — E119 Type 2 diabetes mellitus without complications: Secondary | ICD-10-CM | POA: Insufficient documentation

## 2020-12-12 DIAGNOSIS — H43392 Other vitreous opacities, left eye: Secondary | ICD-10-CM | POA: Diagnosis not present

## 2020-12-12 NOTE — Assessment & Plan Note (Signed)
The nature of vitreous floaters was discussed with the patient as well as their frequent occurrence with development of posterior vitreous detachment. The significance of flashes and field cuts was discussed with the patient. The need for a dilated fundus examination was discussed and advice given to return immediately for new or different floaters .  More uncommonly, vitreous floaters, debris, or clouds of haze may develop with impact on visual functioning.  If the personal impact to the patient is minor, observation usually results in diminishing symptoms.  If visual impact hampers activity of daily living over a period of time, medical interventions are available to change the condition, including laser vitreolysis or more commonly, surgical intervention to remove the visual impactful floaters.

## 2020-12-12 NOTE — Progress Notes (Signed)
12/12/2020     CHIEF COMPLAINT Patient presents for Retina Follow Up   HISTORY OF PRESENT ILLNESS: Rachael Weaver is a 70 y.o. female who presents to the clinic today for:   HPI     Retina Follow Up           Diagnosis: Other   Laterality: both eyes   Onset: 4 months ago   Severity: mild   Duration: 4 months         Comments   4 mos fu os oct Pt. States va has changed a little. "It used to be a round circle kind of blocking my vision in the center, now it is blocking a little to the right and is more like a blob." BS: not checked      Last edited by Laurin Coder, COA on 12/12/2020 10:42 AM.      Referring physician: Debbra Riding, MD [address] [place],  [place] [address]  HISTORICAL INFORMATION:   Selected notes from the MEDICAL RECORD NUMBER    Lab Results  Component Value Date   HGBA1C 7.5 (A) 03/16/2018     CURRENT MEDICATIONS: No current outpatient medications on file. (Ophthalmic Drugs)   No current facility-administered medications for this visit. (Ophthalmic Drugs)   Current Outpatient Medications (Other)  Medication Sig   albuterol (VENTOLIN HFA) 108 (90 Base) MCG/ACT inhaler Inhale 2 puffs into the lungs every 6 (six) hours as needed for wheezing or shortness of breath.   aspirin-acetaminophen-caffeine (EXCEDRIN MIGRAINE) 250-250-65 MG per tablet Take 1 tablet by mouth as needed.   benzonatate (TESSALON PERLES) 100 MG capsule Take 1 capsule (100 mg total) by mouth 3 (three) times daily as needed.   fexofenadine (ALLEGRA) 30 MG tablet Take 30 mg by mouth as needed.   guaiFENesin-codeine 100-10 MG/5ML syrup Take 10 mLs by mouth every 6 (six) hours as needed for cough.   lansoprazole (PREVACID) 15 MG capsule Take 15 mg by mouth daily at 12 noon. As needed for acid reflux   metFORMIN (GLUCOPHAGE-XR) 500 MG 24 hr tablet Take 1 tablet (500 mg total) by mouth 2 (two) times daily.   olmesartan (BENICAR) 20 MG tablet Take 1 tablet  (20 mg total) by mouth daily. Patient needs office visit for more refills   predniSONE (STERAPRED UNI-PAK 21 TAB) 10 MG (21) TBPK tablet Use as directed   rosuvastatin (CRESTOR) 20 MG tablet Take 1 tablet (20 mg total) by mouth daily.   No current facility-administered medications for this visit. (Other)      REVIEW OF SYSTEMS:    ALLERGIES No Known Allergies  PAST MEDICAL HISTORY Past Medical History:  Diagnosis Date   Allergy    seasonal /rag weed   Anxiety    Arthritis    hands feet- "aching all over from Tamoxifen use.   Breast cancer [facility])    right breast-surgery 2013, chemo, radiation 11'13 completed.- Mr Magrinat LOV 12'16   Cancer [facility])    Cataract    no surgery yet   Depression 05/03/2013   DES exposure in utero    GERD (gastroesophageal reflux disease)    Headache(784.0)    USES OTC MEDS- no recent problems   HSV-2 infection    H/O   Hypertension    borderline- no meds   Lymph edema    intermittent right arm- presently okay 01-24-16   Maintenance chemotherapy following disease    Personal history of radiation therapy  Seasonal allergies    uses OTC as needed   Past Surgical History:  Procedure Laterality Date   BREAST LUMPECTOMY  08/21/11   right   CESAREAN SECTION     COLONOSCOPY WITH PROPOFOL N/A 01/29/2016   Procedure: COLONOSCOPY WITH PROPOFOL;  Surgeon: Garlan Fair, MD;  [facility];  Service: Endoscopy;  Laterality: N/A;   PORT-A-CATH REMOVAL N/A 04/13/2013   Procedure: MINOR REMOVAL PORT-A-CATH;  Surgeon: Haywood Lasso, MD;  Location: [place];  Service: General;  Laterality: N/A;   PORTACATH PLACEMENT  08/21/2011   Procedure: INSERTION PORT-A-CATH;  Surgeon: Haywood Lasso, MD;  Location: [place];  Service: General;  Laterality: Left;    FAMILY HISTORY Family History  Problem Relation Age of Onset   Diabetes Paternal Grandfather    Heart disease Paternal Grandfather    Hypertension  Father    Stroke Father    Cancer Cousin 37       cervical or ovarian   Breast cancer Maternal Grandmother    Cancer Maternal Grandmother        breast   Breast cancer Maternal Aunt    Cancer Maternal Aunt        breast    SOCIAL HISTORY Social History   Tobacco Use   Smoking status: Former    Packs/day: 1.00    Types: Cigarettes    Quit date: 04/29/1971    Years since quitting: 49.6   Smokeless tobacco: Never  Vaping Use   Vaping Use: Never used  Substance Use Topics   Alcohol use: Yes    Alcohol/week: 0.0 - 1.0 standard drinks    Comment: rarely social glass wine   Drug use: No         OPHTHALMIC EXAM:  Base Eye Exam     Visual Acuity (ETDRS)       Right Left   Dist [place] 20/25 +2 20/25 -2  Tilts head to the right a little to view chart        Tonometry (Tonopen, 10:44 AM)       Right Left   Pressure 12 13         Pupils       Pupils Dark Light Shape React APD   Right PERRL 4 3 Round Brisk None   Left PERRL 4 3 Round Brisk None         Visual Fields (Counting fingers)       Left Right    Full Full         Extraocular Movement       Right Left    Full Full         Neuro/Psych     Oriented x3: Yes   Mood/Affect: Normal         Dilation     Both eyes: 1.0% Mydriacyl, 2.5% Phenylephrine @ 10:44 AM           Slit Lamp and Fundus Exam     External Exam       Right Left   External Normal Normal         Slit Lamp Exam       Right Left   Lids/Lashes Normal Normal   Conjunctiva/Sclera White and quiet White and quiet   Cornea Clear Clear   Anterior Chamber Deep and quiet Deep and quiet   Iris Round and reactive Round and reactive   Lens 1+ Nuclear sclerosis 1+ Nuclear sclerosis   Anterior  Vitreous Normal Normal         Fundus Exam       Right Left   Posterior Vitreous  Central vitreous floaters   Disc  Normal   C/D Ratio  0.35   Macula   Normal,     Vessels  Normal, no DR   Periphery  Normal, no holes  or tears            IMAGING AND PROCEDURES  Imaging and Procedures for 12/12/20  OCT, Retina - OU - Both Eyes       Right Eye Quality was good. Scan locations included subfoveal. Central Foveal Thickness: 258. Progression has been stable. Findings include normal foveal contour.   Left Eye Quality was good. Scan locations included subfoveal. Central Foveal Thickness: 258. Progression has been stable. Findings include normal foveal contour.              ASSESSMENT/PLAN:  Diabetes mellitus without complication [facility]) Patient reports excellent blood sugar control at this time  Vitreous floaters of left eye The nature of vitreous floaters was discussed with the patient as well as their frequent occurrence with development of posterior vitreous detachment. The significance of flashes and field cuts was discussed with the patient. The need for a dilated fundus examination was discussed and advice given to return immediately for new or different floaters .  More uncommonly, vitreous floaters, debris, or clouds of haze may develop with impact on visual functioning.  If the personal impact to the patient is minor, observation usually results in diminishing symptoms.  If visual impact hampers activity of daily living over a period of time, medical interventions are available to change the condition, including laser vitreolysis or more commonly, surgical intervention to remove the visual impactful floaters.     ICD-10-CM   1. Uncontrolled type 2 diabetes mellitus with hyperglycemia (HCC)  E11.65 OCT, Retina - OU - Both Eyes    2. Diabetes mellitus without complication ([place])  [id number]     3. Vitreous floaters of left eye  H43.392       1.  No retinal pathology OU, OS no new findings with vitreous floaters seemingly improved  2.  Diabetic retinopathy not present at this time with excellent blood sugar control  3.  Follow-up with Dr. Ascencion Dike as scheduled  Ophthalmic Meds  Ordered this visit:  No orders of the defined types were placed in this encounter.      Return if symptoms worsen or fail to improve, for And and follow-up with Dr. Ascencion Dike as scheduled.  There are no Patient Instructions on file for this visit.   Explained the diagnoses, plan, and follow up with the patient and they expressed understanding.  Patient expressed understanding of the importance of proper follow up care.   Clent Demark Moriah Shawley M.D. Diseases & Surgery of the Retina and Vitreous Retina & Diabetic [place] 12/12/20     Abbreviations: M myopia (nearsighted); A astigmatism; H hyperopia (farsighted); P presbyopia; Mrx spectacle prescription;  CTL contact lenses; OD right eye; OS left eye; OU both eyes  XT exotropia; ET esotropia; PEK punctate epithelial keratitis; PEE punctate epithelial erosions; DES dry eye syndrome; MGD meibomian gland dysfunction; ATs artificial tears; PFAT's preservative free artificial tears; [place] nuclear sclerotic cataract; PSC posterior subcapsular cataract; ERM epi-retinal membrane; PVD posterior vitreous detachment; RD retinal detachment; DM diabetes mellitus; DR diabetic retinopathy; NPDR non-proliferative diabetic retinopathy; PDR proliferative diabetic retinopathy; CSME clinically significant macular edema; DME diabetic  macular edema; dbh dot blot hemorrhages; CWS cotton wool spot; POAG primary open angle glaucoma; C/D cup-to-disc ratio; HVF humphrey visual field; GVF goldmann visual field; OCT optical coherence tomography; IOP intraocular pressure; BRVO Branch retinal vein occlusion; CRVO central retinal vein occlusion; CRAO central retinal artery occlusion; BRAO branch retinal artery occlusion; RT retinal tear; SB scleral buckle; PPV pars plana vitrectomy; VH Vitreous hemorrhage; PRP panretinal laser photocoagulation; IVK intravitreal kenalog; VMT vitreomacular traction; MH Macular hole;  NVD neovascularization of the disc; NVE neovascularization  elsewhere; AREDS age related eye disease study; ARMD age related macular degeneration; POAG primary open angle glaucoma; EBMD epithelial/anterior basement membrane dystrophy; ACIOL anterior chamber intraocular lens; IOL intraocular lens; PCIOL posterior chamber intraocular lens; Phaco/IOL phacoemulsification with intraocular lens placement; [place] photorefractive keratectomy; LASIK laser assisted in situ keratomileusis; HTN hypertension; DM diabetes mellitus; COPD chronic obstructive pulmonary disease

## 2020-12-12 NOTE — Assessment & Plan Note (Addendum)
Patient reports excellent blood sugar control at this time

## 2021-01-22 ENCOUNTER — Other Ambulatory Visit: Payer: Self-pay | Admitting: Family Medicine

## 2021-01-22 DIAGNOSIS — Z1231 Encounter for screening mammogram for malignant neoplasm of breast: Secondary | ICD-10-CM

## 2021-03-11 ENCOUNTER — Other Ambulatory Visit: Payer: Self-pay

## 2021-03-11 ENCOUNTER — Ambulatory Visit
Admission: RE | Admit: 2021-03-11 | Discharge: 2021-03-11 | Disposition: A | Discharge status: Home / Self Care | Payer: Medicare Other | Source: Ambulatory Visit | Attending: Family Medicine | Admitting: Family Medicine

## 2021-03-11 DIAGNOSIS — Z1231 Encounter for screening mammogram for malignant neoplasm of breast: Secondary | ICD-10-CM

## 2021-04-16 IMAGING — MG DIGITAL SCREENING BILAT W/ TOMO W/ CAD
8 series · 8 of 24 positions shown · non-contrast
Comparison: Previous exam(s).

CLINICAL DATA: Screening.

EXAM:
DIGITAL SCREENING BILATERAL MAMMOGRAM WITH TOMO AND CAD

[L CC synth-2D]
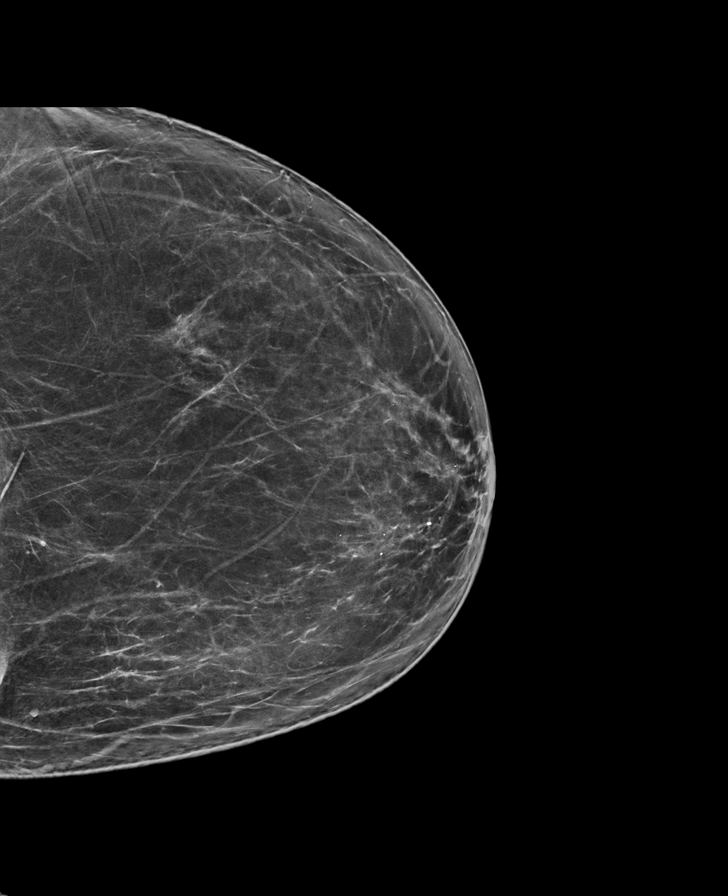

[R CC synth-2D]
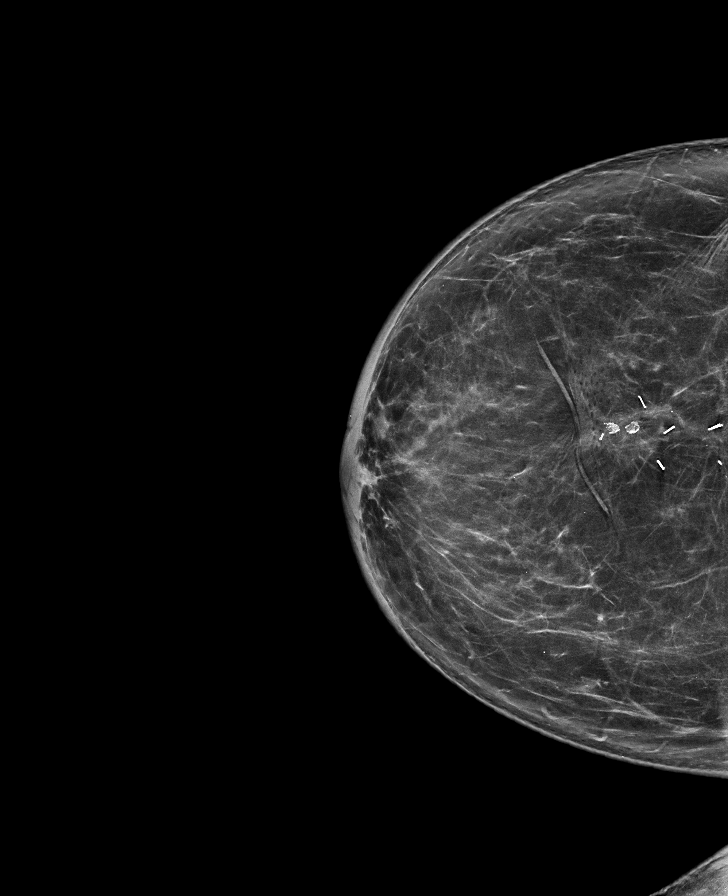

[L MLO synth-2D]
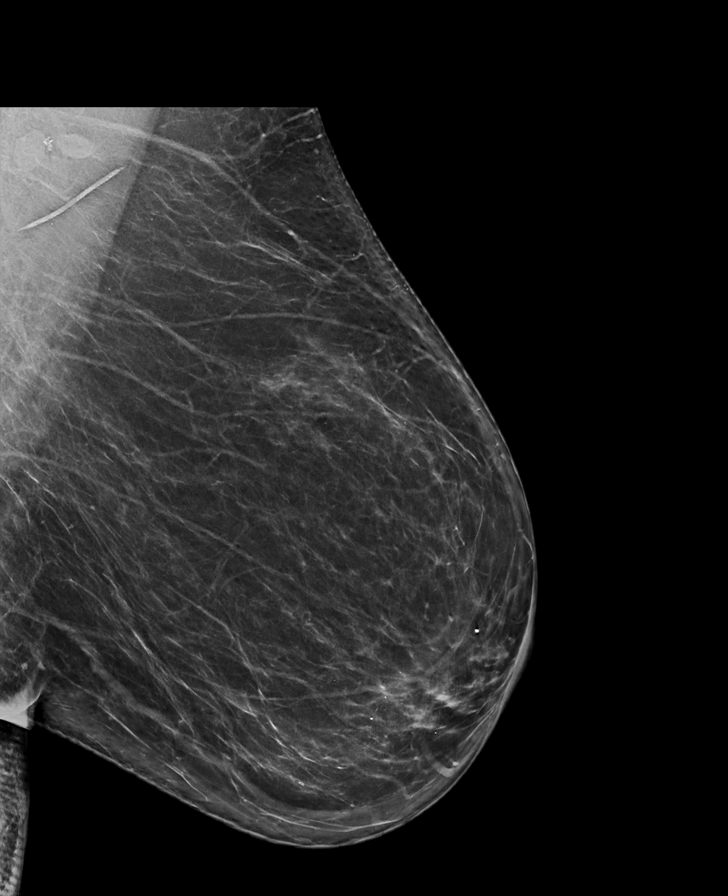

[R MLO synth-2D]
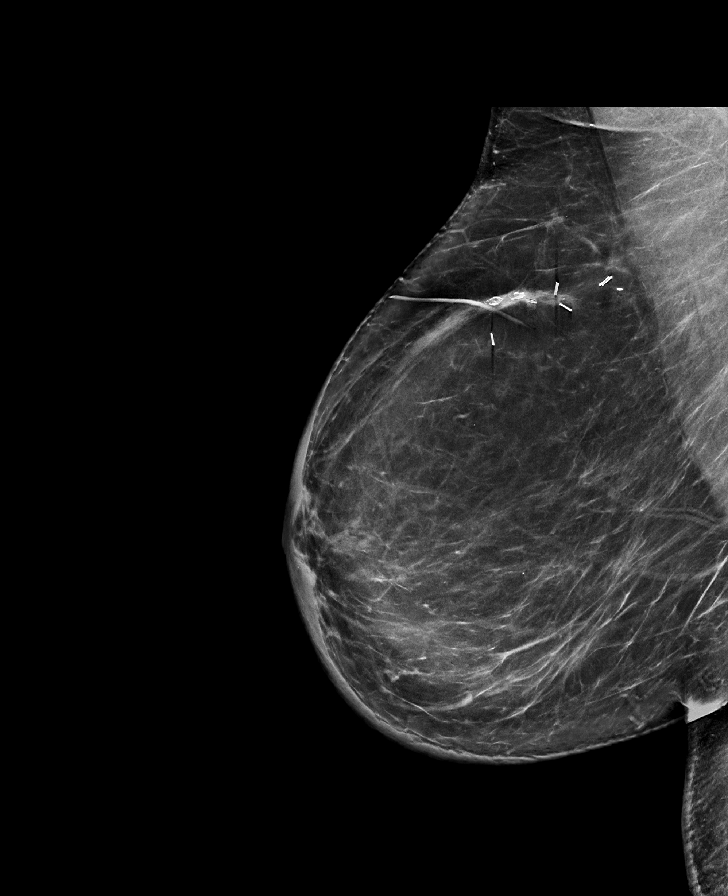

[R MLO tomo · tomo slice 44/87.0]
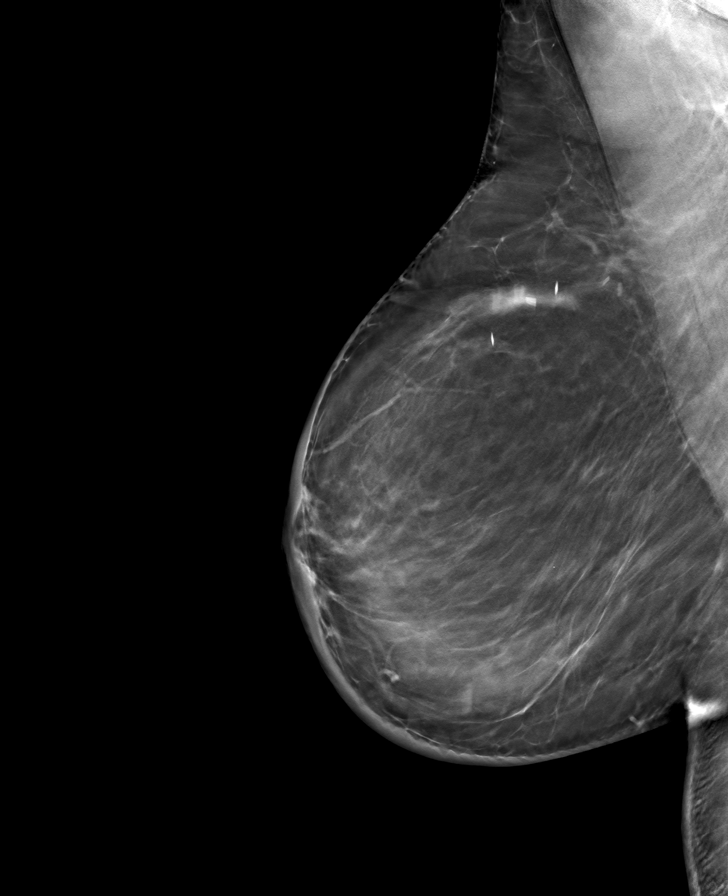

[L MLO tomo · tomo slice 40/79.0]
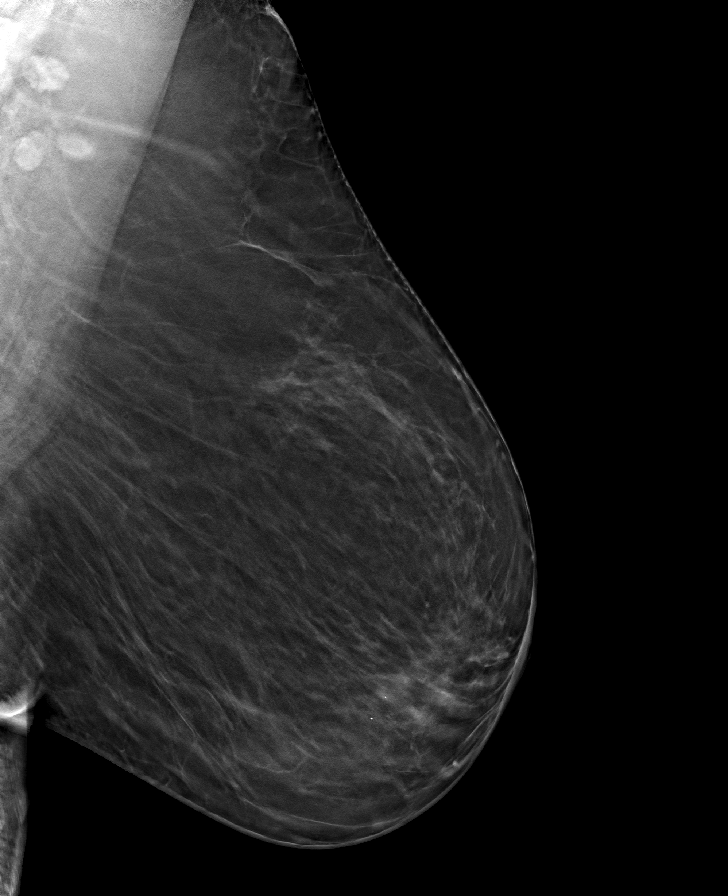

[L CC tomo · tomo slice 41/81.0]
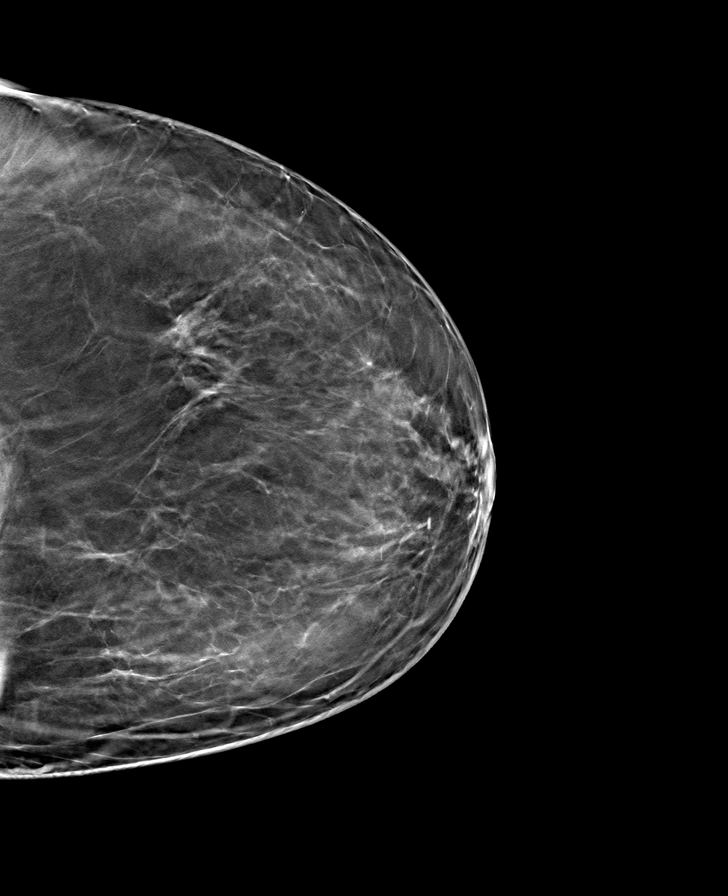

[R CC tomo · tomo slice 43/85.0]
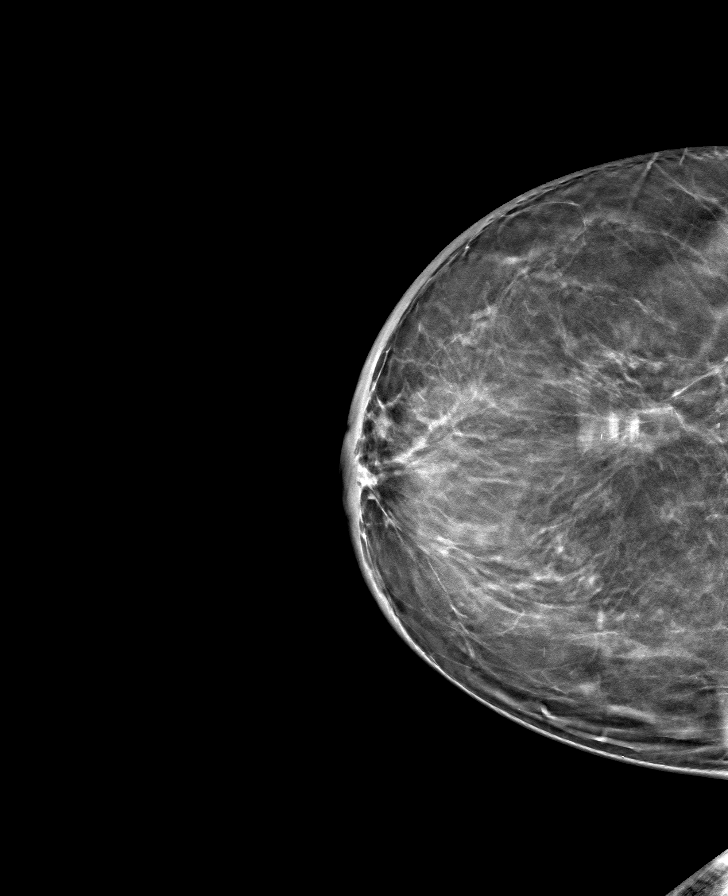

[8 of 24 positions shown; findings below may reference images not displayed]

ACR Breast Density Category b: There are scattered areas of
fibroglandular density.
FINDINGS: There are no findings suspicious for malignancy. Images were
processed with CAD.
IMPRESSION: No mammographic evidence of malignancy. A result letter of this
screening mammogram will be mailed directly to the patient.

RECOMMENDATION:
Screening mammogram in one year. (Code:[id number])

BI-RADS CATEGORY  1: Negative.

## 2021-08-15 ENCOUNTER — Other Ambulatory Visit: Payer: Self-pay | Admitting: Family Medicine

## 2021-08-15 DIAGNOSIS — R310 Gross hematuria: Secondary | ICD-10-CM

## 2022-09-04 ENCOUNTER — Other Ambulatory Visit: Payer: Self-pay | Admitting: Urology

## 2022-09-04 DIAGNOSIS — R102 Pelvic and perineal pain: Secondary | ICD-10-CM

## 2022-10-06 ENCOUNTER — Ambulatory Visit
Admission: RE | Admit: 2022-10-06 | Discharge: 2022-10-06 | Disposition: A | Discharge status: Home / Self Care | Payer: Medicare Other | Source: Ambulatory Visit | Attending: Urology | Admitting: Urology

## 2022-10-06 DIAGNOSIS — R102 Pelvic and perineal pain: Secondary | ICD-10-CM

## 2023-01-01 ENCOUNTER — Other Ambulatory Visit: Payer: Self-pay | Admitting: Family Medicine

## 2023-01-01 DIAGNOSIS — Z1231 Encounter for screening mammogram for malignant neoplasm of breast: Secondary | ICD-10-CM

## 2023-01-08 ENCOUNTER — Ambulatory Visit
Admission: RE | Admit: 2023-01-08 | Discharge: 2023-01-08 | Disposition: A | Discharge status: Home / Self Care | Payer: Medicare Other | Source: Ambulatory Visit | Attending: Family Medicine | Admitting: Family Medicine

## 2023-01-08 DIAGNOSIS — Z1231 Encounter for screening mammogram for malignant neoplasm of breast: Secondary | ICD-10-CM

## 2023-09-10 ENCOUNTER — Encounter: Payer: Self-pay | Admitting: Sports Medicine

## 2023-09-10 ENCOUNTER — Other Ambulatory Visit (INDEPENDENT_AMBULATORY_CARE_PROVIDER_SITE_OTHER): Payer: Self-pay

## 2023-09-10 ENCOUNTER — Ambulatory Visit: Admitting: Sports Medicine

## 2023-09-10 DIAGNOSIS — M79671 Pain in right foot: Secondary | ICD-10-CM | POA: Diagnosis not present

## 2023-09-10 DIAGNOSIS — M19072 Primary osteoarthritis, left ankle and foot: Secondary | ICD-10-CM

## 2023-09-10 DIAGNOSIS — M19071 Primary osteoarthritis, right ankle and foot: Secondary | ICD-10-CM

## 2023-09-10 DIAGNOSIS — M216X2 Other acquired deformities of left foot: Secondary | ICD-10-CM

## 2023-09-10 DIAGNOSIS — M79672 Pain in left foot: Secondary | ICD-10-CM

## 2023-09-10 DIAGNOSIS — M21611 Bunion of right foot: Secondary | ICD-10-CM

## 2023-09-10 MED ORDER — MELOXICAM 15 MG PO TABS: 15.0000 mg | ORAL_TABLET | Freq: Every day | ORAL | 0 refills | Status: DC

## 2023-09-10 NOTE — Progress Notes (Signed)
 Patient says that she began having pain in the tops of her feet when pregnant with her son 30 years ago. It has come and gone in the years since then, but has gotten especially bad in the last 3 years. She says that she had plantar fasciitis years ago, but had an injection and has not had pain there since. Her pain now is in the tops of the feet, and has begun to move up the front of the ankle. She says that her feet hurt constantly. Over the years, the only relief that she has gotten has come from foot rubs. 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 She purchased insoles for her shoes to wear during work and those do seem to help some. She also mentions previously walking 3 miles per day, but is unable to do that now due to pain.

## 2023-09-10 NOTE — Progress Notes (Signed)
 Rachael Weaver - 73 y.o. female MRN [id number]  Date of birth: 06/13/1950  Office Visit Note: Visit Date: 09/10/2023 PCP: Allene Ivan, MD Referred by: Shauna Del, DO  Subjective: Chief Complaint  Patient presents with   Right Foot - Pain   Left Foot - Pain   HPI: Rachael Weaver is a pleasant 73 y.o. female who presents today for chronic bilateral foot pain.  She has had pain in the midfoot and tops of her feet for many years.  She states this started about 30 years ago when she was pregnant with her son.  The pain has not been consistent but has come and went since that time.  She also has a history of plantar fasciitis years ago which did improve with injections.  Just recently, she did purchase some one half length insoles for her shoes to wear for work and this certainly does help to a degree.  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  She has had relief from foot rubs.  Occasional anti-inflammatories but does not take on a consistent basis.  Pertinent ROS were reviewed with the patient and found to be negative unless otherwise specified above in HPI.   Assessment & Plan: Visit Diagnoses:  1. Arthritis of both midfeet   2. Pain in right foot   3. Pain in left foot   4. Loss of transverse plantar arch, left   5. Bunion, right foot    Plan: Impression is chronic bilateral foot pain with advanced midfoot arthritic change with subchondral cystic change near the cuneiform to metatarsal juncture.  She also has notable transverse arch breakdown of the left foot with splaying of toes 1-2 and 2-3.  I do believe her pain is emanating from the arthritic change and lack of support in the midfoot region.  She has received partial relief with over-the-counter orthotics, but I believe we need to support her arch as well as provide some cushioning and she would benefit from custom orthotics  as well as additional metatarsal padding.  We will send a referral to the sports medicine center for this.  May begin meloxicam 15 mg to be taken x 2 weeks.  She will follow-up with me after she is ambulated in her new orthotics for a few weeks.  Her bunion is not severe and has not progressed, would not recommend any surgical treatment for this at this time.  Follow-up: Return for f/u 4-5 weeks for bilateral foot pain.   Meds & Orders:  Meds ordered this encounter  Medications   meloxicam (MOBIC) 15 MG tablet    Sig: Take 1 tablet (15 mg total) by mouth daily.    Dispense:  30 tablet    Refill:  0    Orders Placed This Encounter  Procedures   XR Foot Complete Right   XR Foot Complete Left   AMB referral to sports medicine     Procedures: No procedures performed      Clinical History: No specialty comments available.  She reports that she quit smoking about 52 years ago. Her smoking use included cigarettes. She has never used smokeless tobacco. No results for input(s): "HGBA1C", "LABURIC" in the last 8760 hours.  Objective:   Vital Signs: LMP 04/29/2003 (Approximate)   Physical Exam  Gen: Well-appearing, in no acute distress; non-toxic CV: Well-perfused. Warm.  Resp: Breathing unlabored  on room air; no wheezing. Psych: Fluid speech in conversation; appropriate affect; normal thought process  Ortho Exam - Bilateral feet: There is preserved longitudinal arch at rest, she does have mild longitudinal arch loss of the right greater than left foot with standing.  There is significant transverse arch loss of the left foot with splaying of toes 1-2 and 2-3.  There is a mild bunion deformity on the right great toe.  There is bony bossing over the area of the midfoot right > left.  There is no significant swelling or effusion about the ankle joints.  Imaging: XR Foot Complete Left Result Date: 09/10/2023 3 views of both the right and left foot including AP, lateral and oblique film  were ordered and reviewed by myself today.  X-rays demonstrate advanced arthritic change within the midfoot, most notable at the cuneiform to metatarsal junction with subchondral cystic change and osteophytosis.  There are small plantar calcaneal spurs.  Notable bunion deformity which is mild to moderate of the right foot.  XR Foot Complete Right Result Date: 09/10/2023 3 views of both the right and left foot including AP, lateral and oblique film were ordered and reviewed by myself today.  X-rays demonstrate advanced arthritic change within the midfoot, most notable at the cuneiform to metatarsal junction with subchondral cystic change and osteophytosis.  There are small plantar calcaneal spurs.  Notable bunion deformity which is mild to moderate of the right foot.   Past Medical/Family/Surgical/Social History: Medications & Allergies reviewed per EMR, new medications updated. Patient Active Problem List   Diagnosis Date Noted   Diabetes mellitus without complication (HCC) 12/12/2020   Vitreous floaters of left eye 12/12/2020   Vitreomacular traction syndrome of left eye 08/08/2020   Posterior vitreous detachment of right eye 08/08/2020   Nuclear sclerotic cataract of both eyes 08/08/2020   Elevated triglycerides with high cholesterol 10/14/2017   Elevated hemoglobin A1c 10/14/2017   Uncontrolled type 2 diabetes mellitus with hyperglycemia (HCC) 10/14/2017   Hot flashes 10/18/2014   Neuropathy 06/09/2014   Depression 05/03/2013   HTN (hypertension) 08/23/2012   Allergy    Breast cancer (HCC) 08/26/2011   Malignant neoplasm of upper-outer quadrant of right breast in female, estrogen receptor positive (HCC) 07/24/2011   Allergy history unknown 07/10/2011   Past Medical History:  Diagnosis Date   Allergy    seasonal /rag weed   Anxiety    Arthritis    hands feet- "aching all over from Tamoxifen  use.   Breast cancer [facility])    right breast-surgery 2013, chemo, radiation 11'13  completed.- Mr Magrinat LOV 12'16   Cancer [facility])    Cataract    no surgery yet   Depression 05/03/2013   DES exposure in utero    GERD (gastroesophageal reflux disease)    Headache(784.0)    USES OTC MEDS- no recent problems   HSV-2 infection    H/O   Hypertension    borderline- no meds   Lymph edema    intermittent right arm- presently okay 01-24-16   Maintenance chemotherapy following disease    Personal history of radiation therapy    Seasonal allergies    uses OTC as needed   Family History  Problem Relation Age of Onset   Diabetes Paternal Grandfather    Heart disease Paternal Grandfather    Hypertension Father    Stroke Father    Cancer Cousin 40       cervical or ovarian   Breast cancer Maternal Grandmother  Cancer Maternal Grandmother        breast   Breast cancer Maternal Aunt    Cancer Maternal Aunt        breast   Past Surgical History:  Procedure Laterality Date   BREAST LUMPECTOMY Right 08/21/2011   right   CESAREAN SECTION     COLONOSCOPY WITH PROPOFOL  N/A 01/29/2016   Procedure: COLONOSCOPY WITH PROPOFOL ;  Surgeon: Garrett Kallman, MD;  Location: [facility];  Service: Endoscopy;  Laterality: N/A;   PORT-A-CATH REMOVAL N/A 04/13/2013   Procedure: MINOR REMOVAL PORT-A-CATH;  Surgeon: Darcella Earnest, MD;  Location: [facility];  Service: General;  Laterality: N/A;   PORTACATH PLACEMENT  08/21/2011   Procedure: INSERTION PORT-A-CATH;  Surgeon: Darcella Earnest, MD;  Location: [facility];  Service: General;  Laterality: Left;   Social History   Occupational History    Comment: commercial real estate appraiser  Tobacco Use   Smoking status: Former    Current packs/day: 0.00    Types: Cigarettes    Quit date: 04/29/1971    Years since quitting: 52.4   Smokeless tobacco: Never  Vaping Use   Vaping status: Never Used  Substance and Sexual Activity   Alcohol use: Yes    Alcohol/week: 0.0 - 1.0 standard drinks of  alcohol    Comment: rarely social glass wine   Drug use: No   Sexual activity: Not Currently    Birth control/protection: None

## 2023-09-16 ENCOUNTER — Encounter: Payer: Self-pay | Admitting: Family Medicine

## 2023-09-16 ENCOUNTER — Ambulatory Visit (INDEPENDENT_AMBULATORY_CARE_PROVIDER_SITE_OTHER): Admitting: Family Medicine

## 2023-09-16 VITALS — BP 106/72 | Ht 63.0 in | Wt 170.0 lb

## 2023-09-16 DIAGNOSIS — M79671 Pain in right foot: Secondary | ICD-10-CM | POA: Diagnosis present

## 2023-09-16 NOTE — Progress Notes (Unsigned)
  [name] - 73 y.o. female MRN [id number]  Date of birth: [birth year]  PCP: Allene Ivan, MD  Subjective:  No chief complaint on file.  Foot pain  HPI: Past Medical, Surgical, Social, and Family History Reviewed & Updated per EMR.   Patient is a 73 y.o. female here for custom orthotics for long standing foot pain and known dorsal TMT joint OA.   Past Surgical History:  Procedure Laterality Date   BREAST LUMPECTOMY Right 08/21/2011   right   CESAREAN SECTION     COLONOSCOPY WITH PROPOFOL  N/A 01/29/2016   Procedure: COLONOSCOPY WITH PROPOFOL ;  Surgeon: Garrett Kallman, MD;  Location: [facility];  Service: Endoscopy;  Laterality: N/A;   PORT-A-CATH REMOVAL N/A 04/13/2013   Procedure: MINOR REMOVAL PORT-A-CATH;  Surgeon: Darcella Earnest, MD;  Location: [facility];  Service: General;  Laterality: N/A;   PORTACATH PLACEMENT  08/21/2011   Procedure: INSERTION PORT-A-CATH;  Surgeon: Darcella Earnest, MD;  Location: [facility];  Service: General;  Laterality: Left;    No Known Allergies      Objective:  Physical Exam: VS: BP:106/72  HR: bpm  TEMP: ( )  RESP:   HT:5\' 3"  (160 cm)   WT:170 lb (77.1 kg)  BMI:30.12  Gen: Well developed, NAD, speaks clearly, comfortable in exam room Respiratory: Normal work of breathing on room air, no respiratory distress Skin: No rashes, abrasions, or ecchymosis MSK:  The feet show some mild breakdown of th L>R long arch R>L breakdown of the transverse arch Splaying of th 1-2 toes bilat Valgus formation of the right great toe with callus formation over the medial DIP Increased loading seen on the right distal 5th MT Neuro: No sensory deficits   Assessment & Plan:   Foot pain, right Patient was fitted for a standard, cushioned, semi-rigid orthotic.  The orthotic was heated and the patient stood on the orthotic blank positioned on the orthotic stand. The patient was positioned in subtalar neutral  position and 10 degrees of ankle dorsiflexion in a weight bearing stance. After molding, a stable [employer] EVA base was applied to the orthotic blank.   The blank was ground to a stable position for weight bearing. size: 9 base: none posting: none additional orthotic padding: none Orthotics fit well and patients gait: neutral and reported increased comfort  We will happily see her back for any adjustments. She may benefit from a MT pad in the future.     Berneda Bridges MD [facility] Sports Medicine Fellow

## 2023-09-16 NOTE — Assessment & Plan Note (Signed)
 Patient was fitted for a standard, cushioned, semi-rigid orthotic.  The orthotic was heated and the patient stood on the orthotic blank positioned on the orthotic stand. The patient was positioned in subtalar neutral position and 10 degrees of ankle dorsiflexion in a weight bearing stance. After molding, a stable [employer] EVA base was applied to the orthotic blank.   The blank was ground to a stable position for weight bearing. size: 9 base: none posting: none additional orthotic padding: none Orthotics fit well and patients gait: neutral and reported increased comfort  We will happily see her back for any adjustments. She may benefit from a MT pad in the future.

## 2023-10-08 ENCOUNTER — Encounter: Payer: Self-pay | Admitting: Sports Medicine

## 2023-10-08 ENCOUNTER — Ambulatory Visit: Admitting: Sports Medicine

## 2023-10-08 DIAGNOSIS — M79672 Pain in left foot: Secondary | ICD-10-CM

## 2023-10-08 DIAGNOSIS — M7741 Metatarsalgia, right foot: Secondary | ICD-10-CM

## 2023-10-08 DIAGNOSIS — M79671 Pain in right foot: Secondary | ICD-10-CM

## 2023-10-08 DIAGNOSIS — M19071 Primary osteoarthritis, right ankle and foot: Secondary | ICD-10-CM | POA: Diagnosis not present

## 2023-10-08 DIAGNOSIS — M19072 Primary osteoarthritis, left ankle and foot: Secondary | ICD-10-CM

## 2023-10-08 DIAGNOSIS — G8929 Other chronic pain: Secondary | ICD-10-CM

## 2023-10-08 NOTE — Progress Notes (Signed)
 Rachael Weaver - 73 y.o. female MRN [id number]  Date of birth: [birth year]  Office Visit Note: Visit Date: 10/08/2023 PCP: Allene Ivan, MD Referred by: Allene Ivan, MD  Subjective: Chief Complaint  Patient presents with   Right Foot - Follow-up   Left Foot - Follow-up   HPI: Rachael Weaver is a pleasant 73 y.o. female who presents today for f/u of bilateral foot pain.  [name] presents for follow-up of bilateral foot pain with advanced midfoot OA.  Since her last visit, she was able to have customized orthotics made.  This has helped some, although they talked about possibly putting metatarsal pads which they did not at that visit.  The left foot certainly feels better but she still continues with right foot pain over the dorsum and at times will radiate on the lateral sides.  When she is not wearing her shoes, she has more pain around the house.  She did not notice a big difference with meloxicam  15 mg.  Pertinent ROS were reviewed with the patient and found to be negative unless otherwise specified above in HPI.   Assessment & Plan: Visit Diagnoses:  1. Arthritis of both midfeet   2. Chronic pain in left foot   3. Chronic pain in right foot   4. Metatarsalgia, right foot    Plan: Impression is chronic bilateral foot pain with advanced midfoot arthritis with subchondral cystic change at the cuneiform-metatarsal juncture.  And was able to be fitted with orthotics which have been helpful, but I do think she needs metatarsal padding to help support and offload the metatarsals and her midfoot, these were placed into her orthotics today.  She will wear these over the next few weeks to see what sort of relief she receives.  Also gave her information for arch straps which you may wear when barefoot or when not wearing the shoes to help support her arch and offload the midfoot.  Meloxicam  15mg  was not helpful, we will discontinue this going forward.  She may continue with massage,  may also use over-the-counter anti-inflammatories or Tylenol  as needed.  Discussed she could return to [facility] to adjust the orthotics if she is not finding relief.  I would like to see her back in about 1 month with today's changes to see what sort of relief she receives.  We did discuss the role for possible ultrasound-guided corticosteroid injection in the future if not significantly improving.  Follow-up in 1 month.  Follow-up: Return in about 1 month (around 11/07/2023) for Bilateral feet (30-mins for poss US -inj).   Meds & Orders: No orders of the defined types were placed in this encounter.  No orders of the defined types were placed in this encounter.    Procedures: No procedures performed      Clinical History: No specialty comments available.  She reports that she quit smoking about 52 years ago. Her smoking use included cigarettes. She has never used smokeless tobacco. No results for input(s): HGBA1C, LABURIC in the last 8760 hours.  Objective:   Vital Signs: LMP 04/29/2003 (Approximate)   Physical Exam  Gen: Well-appearing, in no acute distress; non-toxic CV: Well-perfused. Warm.  Resp: Breathing unlabored on room air; no wheezing. Psych: Fluid speech in conversation; appropriate affect; normal thought process  Ortho Exam - Bilateral feet: There is mild loss of longitudinal arch on the right and left foot.  There is bony bossing on the right midfoot greater than the left side with notable transverse  arch left bilaterally.  There is pain palpating over the metatarsals on the plantar aspect.  There is a mild bunion deformity on the right toe.  There is no swelling or effusion about the ankle joint.  Imaging: No results found.  Past Medical/Family/Surgical/Social History: Medications & Allergies reviewed per EMR, new medications updated. Patient Active Problem List   Diagnosis Date Noted   Foot pain, right 09/16/2023   Diabetes mellitus without complication (HCC)  12/12/2020   Vitreous floaters of left eye 12/12/2020   Vitreomacular traction syndrome of left eye 08/08/2020   Posterior vitreous detachment of right eye 08/08/2020   Nuclear sclerotic cataract of both eyes 08/08/2020   Elevated triglycerides with high cholesterol 10/14/2017   Elevated hemoglobin A1c 10/14/2017   Uncontrolled type 2 diabetes mellitus with hyperglycemia (HCC) 10/14/2017   Hot flashes 10/18/2014   Neuropathy 06/09/2014   Depression 05/03/2013   HTN (hypertension) 08/23/2012   Allergy    Breast cancer (HCC) 08/26/2011   Malignant neoplasm of upper-outer quadrant of right breast in female, estrogen receptor positive (HCC) 07/24/2011   Allergy history unknown 07/10/2011   Past Medical History:  Diagnosis Date   Allergy    seasonal /rag weed   Anxiety    Arthritis    hands feet- aching all over from Tamoxifen  use.   Breast cancer [facility])    right breast-surgery 2013, chemo, radiation 11'13 completed.- Mr Magrinat LOV 12'16   Cancer [facility])    Cataract    no surgery yet   Depression 05/03/2013   DES exposure in utero    GERD (gastroesophageal reflux disease)    Headache(784.0)    USES OTC MEDS- no recent problems   HSV-2 infection    H/O   Hypertension    borderline- no meds   Lymph edema    intermittent right arm- presently okay 01-24-16   Maintenance chemotherapy following disease    Personal history of radiation therapy    Seasonal allergies    uses OTC as needed   Family History  Problem Relation Age of Onset   Diabetes Paternal Grandfather    Heart disease Paternal Grandfather    Hypertension Father    Stroke Father    Cancer Cousin 40       cervical or ovarian   Breast cancer Maternal Grandmother    Cancer Maternal Grandmother        breast   Breast cancer Maternal Aunt    Cancer Maternal Aunt        breast   Past Surgical History:  Procedure Laterality Date   BREAST LUMPECTOMY Right 08/21/2011   right   CESAREAN SECTION     COLONOSCOPY  WITH PROPOFOL  N/A 01/29/2016   Procedure: COLONOSCOPY WITH PROPOFOL ;  Surgeon: Garrett Kallman, MD;  Location: [facility];  Service: Endoscopy;  Laterality: N/A;   PORT-A-CATH REMOVAL N/A 04/13/2013   Procedure: MINOR REMOVAL PORT-A-CATH;  Surgeon: Darcella Earnest, MD;  Location: [facility];  Service: General;  Laterality: N/A;   PORTACATH PLACEMENT  08/21/2011   Procedure: INSERTION PORT-A-CATH;  Surgeon: Darcella Earnest, MD;  Location: [facility];  Service: General;  Laterality: Left;   Social History   Occupational History    Comment: commercial real estate appraiser  Tobacco Use   Smoking status: Former    Current packs/day: 0.00    Types: Cigarettes    Quit date: 04/29/1971    Years since quitting: 52.4   Smokeless tobacco: Never  Vaping Use  Vaping status: Never Used  Substance and Sexual Activity   Alcohol use: Yes    Alcohol/week: 0.0 - 1.0 standard drinks of alcohol    Comment: rarely social glass wine   Drug use: No   Sexual activity: Not Currently    Birth control/protection: None

## 2023-10-08 NOTE — Progress Notes (Signed)
 Patient says that she is somewhat better, but not significantly. She says that the insoles do help her initial pain, although she feels a bit pitched forward, and may want to try metatarsal pads. When she wears the insoles, she has new pain on the sides of the feet and up the shins. She says that she works from home and does not wear shoes during that time, so her pain is worse when she does not have shoes. She did not have any noticeable relief with the 2-week course of Meloxicam .

## 2023-11-09 ENCOUNTER — Encounter: Payer: Self-pay | Admitting: Sports Medicine

## 2023-11-09 ENCOUNTER — Other Ambulatory Visit: Payer: Self-pay

## 2023-11-09 ENCOUNTER — Ambulatory Visit (INDEPENDENT_AMBULATORY_CARE_PROVIDER_SITE_OTHER): Admitting: Sports Medicine

## 2023-11-09 DIAGNOSIS — M79671 Pain in right foot: Secondary | ICD-10-CM | POA: Diagnosis not present

## 2023-11-09 DIAGNOSIS — M19072 Primary osteoarthritis, left ankle and foot: Secondary | ICD-10-CM

## 2023-11-09 DIAGNOSIS — M19071 Primary osteoarthritis, right ankle and foot: Secondary | ICD-10-CM

## 2023-11-09 DIAGNOSIS — E1165 Type 2 diabetes mellitus with hyperglycemia: Secondary | ICD-10-CM | POA: Diagnosis not present

## 2023-11-09 DIAGNOSIS — M7741 Metatarsalgia, right foot: Secondary | ICD-10-CM | POA: Diagnosis not present

## 2023-11-09 DIAGNOSIS — G8929 Other chronic pain: Secondary | ICD-10-CM | POA: Diagnosis not present

## 2023-11-09 MED ORDER — BUPIVACAINE HCL 0.25 % IJ SOLN
1.0000 mL | INTRAMUSCULAR | Status: AC | PRN
  Administered 2023-11-09: 1 mL via INTRA_ARTICULAR

## 2023-11-09 MED ORDER — BETAMETHASONE SOD PHOS & ACET 6 (3-3) MG/ML IJ SUSP
6.0000 mg | INTRAMUSCULAR | Status: AC | PRN
  Administered 2023-11-09: 6 mg via INTRA_ARTICULAR

## 2023-11-09 NOTE — Progress Notes (Signed)
 Rachael Weaver - 73 y.o. female MRN [id number]  Date of birth: April 24, 1951  Office Visit Note: Visit Date: 11/09/2023 PCP: Rachael Darice CROME, MD Referred by: Rachael Darice CROME, MD  Subjective: Chief Complaint  Patient presents with   Right Foot - Follow-up   Left Foot - Follow-up   HPI: Rachael Weaver is a pleasant 73 y.o. female who presents today for follow-up of chronic bilateral foot pain with advanced midfoot OA.  She has been ambulating in her custom orthotics.  Last visit we did add a metatarsal pad which did help relieve her pain to an extent - this certainly helped. She feels about 50% relieved on the left side. She still has found these helpful but her right side is more significant than her left.  She is interested in trialing an injection.  She is a type-II diabetic, her last A1c was in the mid 7's about 1-2 months ago (checked with PCP).  Years ago she was uncontrolled with A1c's in the nines, she was able to bring this down into the 5-6 range with diet and lifestyle intervention.  She is managed currently on Jardiance 25 mg daily as well as metformin  500-1000mg  daily.  Pertinent ROS were reviewed with the patient and found to be negative unless otherwise specified above in HPI.   Assessment & Plan: Visit Diagnoses:  1. Arthritis of both midfeet   2. Metatarsalgia, right foot   3. Chronic pain in right foot   4. Uncontrolled type 2 diabetes mellitus with hyperglycemia (HCC)    Plan: Impression is chronic bilateral foot pain with advanced midfoot arthritis of the foot/ankle, right > left.  She has made improvements with customized orthotics as well as metatarsal padding to help support both her longitudinal and transverse arches.  This has helped improve her left foot pain which is manageable, however her right foot still causes her significant discomfort.  Through shared decision making, we did proceed with ultrasound-guided right ankle/foot midfoot corticosteroid  injection, patient tolerated well.  Advised on postinjection protocol.  She does have type 2 diabetes but is fairly well-controlled, she will check for any transient glucose rise and will continue her Jardiance 25 mg daily as well as her metformin  [id number] mg daily.  Continue routine follow-up with PCP for this specifically.  She will wear her orthotics, also recommended wearing her arch straps when barefoot or with sandals.   She was discussing having pain at the base of the right thumb.  She will make an appointment at her leisure to evaluate this and start with x-rays.  Follow-up: Return for Make appt for R-thumb (will obtain x-rays).   Meds & Orders: No orders of the defined types were placed in this encounter.   Orders Placed This Encounter  Procedures   Medium Joint Inj   US  Guided Needle Placement - No Linked Charges     Procedures: Medium Joint Inj: R ankle on 11/09/2023 9:37 AM Indications: pain Details: 25 G 1.5 in needle, ultrasound-guided dorsal approach Medications: 1 mL bupivacaine  0.25 %; 6 mg betamethasone  acetate-betamethasone  sodium phosphate  6 (3-3) MG/ML  *US -guided ankle/foot, midfoot, injection within the tarsometatarsal joint. Procedurally successful injection. No immediate complications. Band-aid applied. Procedure, treatment alternatives, risks and benefits explained, specific risks discussed. Consent was given by the patient. Patient was prepped and draped in the usual sterile fashion.          Clinical History: No specialty comments available.  She reports that she quit smoking about 52 years  ago. Her smoking use included cigarettes. She has never used smokeless tobacco. No results for input(s): HGBA1C, LABURIC in the last 8760 hours.  Objective:   Vital Signs: LMP 04/29/2003 (Approximate)   Physical Exam  Gen: Well-appearing, in no acute distress; non-toxic CV: Well-perfused. Warm.  Resp: Breathing unlabored on room air; no wheezing. Psych: Fluid  speech in conversation; appropriate affect; normal thought process  Ortho Exam - Bilateral feet: There is mild to moderate loss of the longitudinal arch of both feet.  The right foot has more significant bony bossing with arthritic change in the midfoot compared to the left side.  There is loss of transverse arch bilaterally which does correct to a degree in her orthotics with support.  There is no swelling or effusion about the ankle joint.  Imaging:  09/10/23:  XR Foot Complete Left 3 views of both the right and left foot including AP, lateral and oblique  film were ordered and reviewed by myself today.  X-rays demonstrate  advanced arthritic change within the midfoot, most notable at the  cuneiform to metatarsal junction with subchondral cystic change and  osteophytosis.  There are small plantar calcaneal spurs.  Notable bunion  deformity which is mild to moderate of the right foot. XR Foot Complete Right 3 views of both the right and left foot including AP, lateral and oblique  film were ordered and reviewed by myself today.  X-rays demonstrate  advanced arthritic change within the midfoot, most notable at the  cuneiform to metatarsal junction with subchondral cystic change and  osteophytosis.  There are small plantar calcaneal spurs.  Notable bunion  deformity which is mild to moderate of the right foot.  Past Medical/Family/Surgical/Social History: Medications & Allergies reviewed per EMR, new medications updated. Patient Active Problem List   Diagnosis Date Noted   Foot pain, right 09/16/2023   Diabetes mellitus without complication (HCC) 12/12/2020   Vitreous floaters of left eye 12/12/2020   Vitreomacular traction syndrome of left eye 08/08/2020   Posterior vitreous detachment of right eye 08/08/2020   Nuclear sclerotic cataract of both eyes 08/08/2020   Elevated triglycerides with high cholesterol 10/14/2017   Elevated hemoglobin A1c 10/14/2017   Uncontrolled type 2  diabetes mellitus with hyperglycemia (HCC) 10/14/2017   Hot flashes 10/18/2014   Neuropathy 06/09/2014   Depression 05/03/2013   HTN (hypertension) 08/23/2012   Allergy    Breast cancer (HCC) 08/26/2011   Malignant neoplasm of upper-outer quadrant of right breast in female, estrogen receptor positive (HCC) 07/24/2011   Allergy history unknown 07/10/2011   Past Medical History:  Diagnosis Date   Allergy    seasonal /rag weed   Anxiety    Arthritis    hands feet- aching all over from Tamoxifen  use.   Breast cancer [facility])    right breast-surgery 2013, chemo, radiation 11'13 completed.- Mr Magrinat LOV 12'16   Cancer [facility])    Cataract    no surgery yet   Depression 05/03/2013   DES exposure in utero    GERD (gastroesophageal reflux disease)    Headache(784.0)    USES OTC MEDS- no recent problems   HSV-2 infection    H/O   Hypertension    borderline- no meds   Lymph edema    intermittent right arm- presently okay 01-24-16   Maintenance chemotherapy following disease    Personal history of radiation therapy    Seasonal allergies    uses OTC as needed   Family History  Problem Relation Age of  Onset   Diabetes Paternal Grandfather    Heart disease Paternal Grandfather    Hypertension Father    Stroke Father    Cancer Cousin 40       cervical or ovarian   Breast cancer Maternal Grandmother    Cancer Maternal Grandmother        breast   Breast cancer Maternal Aunt    Cancer Maternal Aunt        breast   Past Surgical History:  Procedure Laterality Date   BREAST LUMPECTOMY Right 08/21/2011   right   CESAREAN SECTION     COLONOSCOPY WITH PROPOFOL  N/A 01/29/2016   Procedure: COLONOSCOPY WITH PROPOFOL ;  Surgeon: Gladis MARLA Louder, MD;  Location: [facility];  Service: Endoscopy;  Laterality: N/A;   PORT-A-CATH REMOVAL N/A 04/13/2013   Procedure: MINOR REMOVAL PORT-A-CATH;  Surgeon: Sherlean JINNY Laughter, MD;  Location: [facility];  Service: General;   Laterality: N/A;   PORTACATH PLACEMENT  08/21/2011   Procedure: INSERTION PORT-A-CATH;  Surgeon: Sherlean JINNY Laughter, MD;  Location: [facility];  Service: General;  Laterality: Left;   Social History   Occupational History    Comment: commercial real estate appraiser  Tobacco Use   Smoking status: Former    Current packs/day: 0.00    Types: Cigarettes    Quit date: 04/29/1971    Years since quitting: 52.5   Smokeless tobacco: Never  Vaping Use   Vaping status: Never Used  Substance and Sexual Activity   Alcohol use: Yes    Alcohol/week: 0.0 - 1.0 standard drinks of alcohol    Comment: rarely social glass wine   Drug use: No   Sexual activity: Not Currently    Birth control/protection: None

## 2023-11-09 NOTE — Progress Notes (Signed)
 Patient says that her feet are still painful. She says that the left foot is maybe 50% improved overall, but the right foot is still more bothersome. She says that the feet are especially painful along the side and up the side of the ankle. She would like to try injections today, as previously discussed. She says that the metatarsal padding did help.

## 2023-11-25 ENCOUNTER — Ambulatory Visit: Admitting: Sports Medicine

## 2024-02-02 ENCOUNTER — Other Ambulatory Visit: Payer: Self-pay | Admitting: Family Medicine

## 2024-02-02 DIAGNOSIS — Z1231 Encounter for screening mammogram for malignant neoplasm of breast: Secondary | ICD-10-CM

## 2024-02-23 ENCOUNTER — Ambulatory Visit
Admission: RE | Admit: 2024-02-23 | Discharge: 2024-02-23 | Disposition: A | Discharge status: Home / Self Care | Source: Ambulatory Visit | Attending: Family Medicine | Admitting: Family Medicine

## 2024-02-23 DIAGNOSIS — Z1231 Encounter for screening mammogram for malignant neoplasm of breast: Secondary | ICD-10-CM

## 2024-02-29 ENCOUNTER — Encounter: Payer: Self-pay | Admitting: Radiology

## 2024-05-04 ENCOUNTER — Emergency Department (HOSPITAL_BASED_OUTPATIENT_CLINIC_OR_DEPARTMENT_OTHER)

## 2024-05-04 ENCOUNTER — Observation Stay (HOSPITAL_BASED_OUTPATIENT_CLINIC_OR_DEPARTMENT_OTHER)
Admission: EM | Admit: 2024-05-04 | Discharge: 2024-05-06 | Disposition: A | Discharge status: Home / Self Care | Attending: Emergency Medicine | Admitting: Emergency Medicine

## 2024-05-04 ENCOUNTER — Encounter (HOSPITAL_BASED_OUTPATIENT_CLINIC_OR_DEPARTMENT_OTHER): Payer: Self-pay

## 2024-05-04 DIAGNOSIS — R2689 Other abnormalities of gait and mobility: Secondary | ICD-10-CM | POA: Diagnosis not present

## 2024-05-04 DIAGNOSIS — K219 Gastro-esophageal reflux disease without esophagitis: Secondary | ICD-10-CM | POA: Diagnosis not present

## 2024-05-04 DIAGNOSIS — Z87891 Personal history of nicotine dependence: Secondary | ICD-10-CM | POA: Insufficient documentation

## 2024-05-04 DIAGNOSIS — Z853 Personal history of malignant neoplasm of breast: Secondary | ICD-10-CM | POA: Insufficient documentation

## 2024-05-04 DIAGNOSIS — R079 Chest pain, unspecified: Secondary | ICD-10-CM | POA: Diagnosis present

## 2024-05-04 DIAGNOSIS — F32A Depression, unspecified: Secondary | ICD-10-CM | POA: Diagnosis not present

## 2024-05-04 DIAGNOSIS — Z79899 Other long term (current) drug therapy: Secondary | ICD-10-CM | POA: Insufficient documentation

## 2024-05-04 DIAGNOSIS — S0990XA Unspecified injury of head, initial encounter: Secondary | ICD-10-CM | POA: Diagnosis not present

## 2024-05-04 DIAGNOSIS — S2231XA Fracture of one rib, right side, initial encounter for closed fracture: Secondary | ICD-10-CM | POA: Diagnosis present

## 2024-05-04 DIAGNOSIS — S2222XA Fracture of body of sternum, initial encounter for closed fracture: Secondary | ICD-10-CM | POA: Diagnosis not present

## 2024-05-04 DIAGNOSIS — G629 Polyneuropathy, unspecified: Secondary | ICD-10-CM | POA: Insufficient documentation

## 2024-05-04 DIAGNOSIS — I10 Essential (primary) hypertension: Secondary | ICD-10-CM | POA: Diagnosis not present

## 2024-05-04 DIAGNOSIS — S2220XA Unspecified fracture of sternum, initial encounter for closed fracture: Secondary | ICD-10-CM | POA: Diagnosis present

## 2024-05-04 DIAGNOSIS — Z7982 Long term (current) use of aspirin: Secondary | ICD-10-CM | POA: Diagnosis not present

## 2024-05-04 DIAGNOSIS — Z7984 Long term (current) use of oral hypoglycemic drugs: Secondary | ICD-10-CM | POA: Insufficient documentation

## 2024-05-04 DIAGNOSIS — E119 Type 2 diabetes mellitus without complications: Secondary | ICD-10-CM | POA: Insufficient documentation

## 2024-05-04 DIAGNOSIS — C50919 Malignant neoplasm of unspecified site of unspecified female breast: Secondary | ICD-10-CM | POA: Diagnosis present

## 2024-05-04 DIAGNOSIS — E1165 Type 2 diabetes mellitus with hyperglycemia: Secondary | ICD-10-CM

## 2024-05-04 MED ORDER — FENTANYL CITRATE (PF) 50 MCG/ML IJ SOSY
50.0000 ug | PREFILLED_SYRINGE | Freq: Once | INTRAMUSCULAR | Status: AC
  Administered 2024-05-05: 50 ug via INTRAVENOUS
  Filled 2024-05-04: qty 1
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
## 2024-05-05 ENCOUNTER — Emergency Department (HOSPITAL_BASED_OUTPATIENT_CLINIC_OR_DEPARTMENT_OTHER)

## 2024-05-05 ENCOUNTER — Other Ambulatory Visit: Payer: Self-pay

## 2024-05-05 DIAGNOSIS — S2220XA Unspecified fracture of sternum, initial encounter for closed fracture: Secondary | ICD-10-CM | POA: Diagnosis present

## 2024-05-05 DIAGNOSIS — I959 Hypotension, unspecified: Secondary | ICD-10-CM | POA: Diagnosis not present

## 2024-05-05 DIAGNOSIS — R079 Chest pain, unspecified: Secondary | ICD-10-CM | POA: Diagnosis not present

## 2024-05-05 DIAGNOSIS — S2222XA Fracture of body of sternum, initial encounter for closed fracture: Secondary | ICD-10-CM | POA: Diagnosis present

## 2024-05-05 DIAGNOSIS — Z743 Need for continuous supervision: Secondary | ICD-10-CM | POA: Diagnosis not present

## 2024-05-05 DIAGNOSIS — S2231XA Fracture of one rib, right side, initial encounter for closed fracture: Secondary | ICD-10-CM | POA: Diagnosis present

## 2024-05-05 LAB — CBC WITH DIFFERENTIAL/PLATELET
Abs Immature Granulocytes: 0.06 K/uL (ref 0.00–0.07)
Basophils Absolute: 0 K/uL (ref 0.0–0.1)
Basophils Relative: 0 %
Eosinophils Absolute: 0 K/uL (ref 0.0–0.5)
Eosinophils Relative: 0 %
HCT: 43.4 % (ref 36.0–46.0)
Hemoglobin: 14.6 g/dL (ref 12.0–15.0)
Immature Granulocytes: 1 %
Lymphocytes Relative: 23 %
Lymphs Abs: 2.7 K/uL (ref 0.7–4.0)
MCH: 30.1 pg (ref 26.0–34.0)
MCHC: 33.6 g/dL (ref 30.0–36.0)
MCV: 89.5 fL (ref 80.0–100.0)
Monocytes Absolute: 0.7 K/uL (ref 0.1–1.0)
Monocytes Relative: 6 %
Neutro Abs: 8 K/uL — ABNORMAL HIGH (ref 1.7–7.7)
Neutrophils Relative %: 70 %
Platelets: 260 K/uL (ref 150–400)
RBC: 4.85 MIL/uL (ref 3.87–5.11)
RDW: 13.3 % (ref 11.5–15.5)
WBC: 11.5 K/uL — ABNORMAL HIGH (ref 4.0–10.5)
nRBC: 0 % (ref 0.0–0.2)

## 2024-05-05 LAB — COMPREHENSIVE METABOLIC PANEL WITH GFR
ALT: 39 U/L (ref 0–44)
AST: 43 U/L — ABNORMAL HIGH (ref 15–41)
Albumin: 4.5 g/dL (ref 3.5–5.0)
Alkaline Phosphatase: 117 U/L (ref 38–126)
Anion gap: 15 (ref 5–15)
BUN: 15 mg/dL (ref 8–23)
CO2: 23 mmol/L (ref 22–32)
Calcium: 9.8 mg/dL (ref 8.9–10.3)
Chloride: 104 mmol/L (ref 98–111)
Creatinine, Ser: 0.9 mg/dL (ref 0.44–1.00)
GFR, Estimated: >60 mL/min
Glucose, Bld: 248 mg/dL — ABNORMAL HIGH (ref 70–99)
Potassium: 4.2 mmol/L (ref 3.5–5.1)
Sodium: 142 mmol/L (ref 135–145)
Total Bilirubin: 0.2 mg/dL (ref 0.0–1.2)
Total Protein: 6.7 g/dL (ref 6.5–8.1)

## 2024-05-05 LAB — PHOSPHORUS: Phosphorus: 4.5 mg/dL (ref 2.5–4.6)

## 2024-05-05 LAB — GLUCOSE, CAPILLARY
Glucose-Capillary: 139 mg/dL — ABNORMAL HIGH (ref 70–99)
Glucose-Capillary: 168 mg/dL — ABNORMAL HIGH (ref 70–99)

## 2024-05-05 LAB — PROTIME-INR
INR: 1 (ref 0.8–1.2)
Prothrombin Time: 13.5 s (ref 11.4–15.2)

## 2024-05-05 LAB — MAGNESIUM: Magnesium: 1.9 mg/dL (ref 1.7–2.4)

## 2024-05-05 LAB — TROPONIN T, HIGH SENSITIVITY
Troponin T High Sensitivity: <15 ng/L (ref 0–19)
Troponin T High Sensitivity: <15 ng/L (ref 0–19)

## 2024-05-05 LAB — LIPASE, BLOOD: Lipase: 49 U/L (ref 11–51)

## 2024-05-05 LAB — CBG MONITORING, ED
Glucose-Capillary: 166 mg/dL — ABNORMAL HIGH (ref 70–99)
Glucose-Capillary: 229 mg/dL — ABNORMAL HIGH (ref 70–99)

## 2024-05-05 MED ORDER — ALBUTEROL SULFATE (2.5 MG/3ML) 0.083% IN NEBU
2.5000 mg | INHALATION_SOLUTION | Freq: Four times a day (QID) | RESPIRATORY_TRACT | Status: DC
  Administered 2024-05-05: 2.5 mg via RESPIRATORY_TRACT
  Filled 2024-05-05: qty 3

## 2024-05-05 MED ORDER — ONDANSETRON HCL 4 MG PO TABS: 4.0000 mg | ORAL_TABLET | Freq: Four times a day (QID) | ORAL | Status: DC | PRN

## 2024-05-05 MED ORDER — TRAZODONE HCL 50 MG PO TABS
25.0000 mg | ORAL_TABLET | Freq: Every evening | ORAL | Status: DC | PRN
  Filled 2024-05-05: qty 1

## 2024-05-05 MED ORDER — INSULIN ASPART 100 UNIT/ML IJ SOLN
0.0000 [IU] | Freq: Three times a day (TID) | INTRAMUSCULAR | Status: DC
  Administered 2024-05-06: 2 [IU] via SUBCUTANEOUS
  Administered 2024-05-06: 8 [IU] via SUBCUTANEOUS
  Filled 2024-05-05: qty 8
  Filled 2024-05-05: qty 2

## 2024-05-05 MED ORDER — OXYCODONE HCL 5 MG PO TABS
5.0000 mg | ORAL_TABLET | ORAL | Status: DC | PRN
  Administered 2024-05-05: 5 mg via ORAL
  Filled 2024-05-05: qty 1

## 2024-05-05 MED ORDER — PANTOPRAZOLE SODIUM 40 MG PO TBEC
40.0000 mg | DELAYED_RELEASE_TABLET | Freq: Every day | ORAL | Status: DC
  Administered 2024-05-05 – 2024-05-06 (×2): 40 mg via ORAL
  Filled 2024-05-05 (×2): qty 1

## 2024-05-05 MED ORDER — ACETAMINOPHEN 325 MG PO TABS
650.0000 mg | ORAL_TABLET | Freq: Four times a day (QID) | ORAL | Status: DC | PRN
  Administered 2024-05-05 (×2): 650 mg via ORAL
  Filled 2024-05-05 (×2): qty 2

## 2024-05-05 MED ORDER — SENNOSIDES-DOCUSATE SODIUM 8.6-50 MG PO TABS: 1.0000 | ORAL_TABLET | Freq: Every evening | ORAL | Status: DC | PRN

## 2024-05-05 MED ORDER — SODIUM CHLORIDE 0.9% FLUSH
3.0000 mL | Freq: Two times a day (BID) | INTRAVENOUS | Status: DC
  Administered 2024-05-05 – 2024-05-06 (×2): 3 mL via INTRAVENOUS

## 2024-05-05 MED ORDER — SENNOSIDES-DOCUSATE SODIUM 8.6-50 MG PO TABS
1.0000 | ORAL_TABLET | Freq: Every day | ORAL | Status: DC
  Administered 2024-05-05: 1 via ORAL
  Filled 2024-05-05: qty 1

## 2024-05-05 MED ORDER — HYDROMORPHONE HCL 1 MG/ML IJ SOLN: 0.5000 mg | INTRAMUSCULAR | Status: DC | PRN

## 2024-05-05 MED ORDER — ALBUTEROL SULFATE HFA 108 (90 BASE) MCG/ACT IN AERS: 2.0000 | INHALATION_SPRAY | Freq: Four times a day (QID) | RESPIRATORY_TRACT | Status: DC

## 2024-05-05 MED ORDER — BISACODYL 5 MG PO TBEC: 5.0000 mg | DELAYED_RELEASE_TABLET | Freq: Every day | ORAL | Status: DC | PRN

## 2024-05-05 MED ORDER — OXYCODONE HCL 5 MG PO TABS
5.0000 mg | ORAL_TABLET | Freq: Four times a day (QID) | ORAL | Status: DC | PRN
  Administered 2024-05-05: 5 mg via ORAL
  Filled 2024-05-05: qty 1

## 2024-05-05 MED ORDER — METHOCARBAMOL 500 MG PO TABS
500.0000 mg | ORAL_TABLET | Freq: Four times a day (QID) | ORAL | Status: DC | PRN
  Administered 2024-05-05 (×3): 500 mg via ORAL
  Filled 2024-05-05 (×3): qty 1

## 2024-05-05 MED ORDER — FLEET ENEMA RE ENEM: 1.0000 | ENEMA | Freq: Once | RECTAL | Status: DC | PRN

## 2024-05-05 MED ORDER — IPRATROPIUM BROMIDE 0.02 % IN SOLN: 0.5000 mg | Freq: Four times a day (QID) | RESPIRATORY_TRACT | Status: DC | PRN

## 2024-05-05 MED ORDER — ACETAMINOPHEN 325 MG PO TABS: 650.0000 mg | ORAL_TABLET | Freq: Four times a day (QID) | ORAL | Status: DC | PRN

## 2024-05-05 MED ORDER — HYDROMORPHONE HCL 1 MG/ML IJ SOLN: 1.0000 mg | INTRAMUSCULAR | Status: DC | PRN

## 2024-05-05 MED ORDER — ONDANSETRON HCL 4 MG/2ML IJ SOLN: 4.0000 mg | Freq: Four times a day (QID) | INTRAMUSCULAR | Status: DC | PRN

## 2024-05-05 MED ORDER — ONDANSETRON HCL 4 MG/2ML IJ SOLN
4.0000 mg | Freq: Once | INTRAMUSCULAR | Status: AC
  Administered 2024-05-05: 4 mg via INTRAVENOUS
  Filled 2024-05-05: qty 2

## 2024-05-05 MED ORDER — FENTANYL CITRATE (PF) 50 MCG/ML IJ SOSY
50.0000 ug | PREFILLED_SYRINGE | Freq: Once | INTRAMUSCULAR | Status: AC
  Administered 2024-05-05: 50 ug via INTRAVENOUS
  Filled 2024-05-05: qty 1

## 2024-05-05 MED ORDER — HYDRALAZINE HCL 20 MG/ML IJ SOLN: 10.0000 mg | INTRAMUSCULAR | Status: DC | PRN

## 2024-05-05 MED ORDER — IBUPROFEN 600 MG PO TABS
600.0000 mg | ORAL_TABLET | Freq: Three times a day (TID) | ORAL | Status: DC
  Administered 2024-05-05 – 2024-05-06 (×2): 600 mg via ORAL
  Filled 2024-05-05 (×2): qty 1

## 2024-05-05 MED ORDER — SODIUM CHLORIDE 0.9% FLUSH
3.0000 mL | Freq: Two times a day (BID) | INTRAVENOUS | Status: DC
  Administered 2024-05-05 – 2024-05-06 (×3): 3 mL via INTRAVENOUS

## 2024-05-05 MED ORDER — HEPARIN SODIUM (PORCINE) 5000 UNIT/ML IJ SOLN: 5000.0000 [IU] | Freq: Three times a day (TID) | INTRAMUSCULAR | Status: DC

## 2024-05-05 MED ORDER — ACETAMINOPHEN 650 MG RE SUPP: 650.0000 mg | Freq: Four times a day (QID) | RECTAL | Status: DC | PRN

## 2024-05-05 MED ORDER — IOHEXOL 300 MG/ML  SOLN
100.0000 mL | Freq: Once | INTRAMUSCULAR | Status: AC | PRN
  Administered 2024-05-05: 100 mL via INTRAVENOUS

## 2024-05-05 MED ORDER — GABAPENTIN 100 MG PO CAPS
100.0000 mg | ORAL_CAPSULE | Freq: Three times a day (TID) | ORAL | Status: DC
  Administered 2024-05-05 – 2024-05-06 (×2): 100 mg via ORAL
  Filled 2024-05-05 (×3): qty 1

## 2024-05-05 MED ORDER — ACETAMINOPHEN 500 MG PO TABS
1000.0000 mg | ORAL_TABLET | Freq: Three times a day (TID) | ORAL | Status: DC
  Administered 2024-05-05 – 2024-05-06 (×3): 1000 mg via ORAL
  Filled 2024-05-05 (×3): qty 2

## 2024-05-05 MED ORDER — SODIUM CHLORIDE 0.9 % IV SOLN: INTRAVENOUS | Status: AC

## 2024-05-05 MED ORDER — INSULIN ASPART 100 UNIT/ML IJ SOLN: 0.0000 [IU] | Freq: Every day | INTRAMUSCULAR | Status: DC

## 2024-05-05 NOTE — Assessment & Plan Note (Signed)
 Mood stable, currently on not any medications

## 2024-05-05 NOTE — Assessment & Plan Note (Signed)
-   Blood pressure stable, initially was hypotensive, with pain medication anticipating blood pressures running low -Holding home BP medication of Olmesartan   - Will utilize as needed hydralazine

## 2024-05-05 NOTE — Assessment & Plan Note (Signed)
-   Continuing with as needed analgesics -Encouraging incentive spirometer and flutter valve -Appreciate trauma surgery evaluation

## 2024-05-05 NOTE — ED Notes (Signed)
 Spoke with Zachary at [employer] [facility] for transport @11 :12.

## 2024-05-05 NOTE — Assessment & Plan Note (Signed)
 Last A1c 7.5 -Holding home medication of metformin  -Checking CBG q. ACHS and covering with sliding scale insulin  -

## 2024-05-05 NOTE — Assessment & Plan Note (Deleted)
 History of breast cancer, status post treatment in 2013 Currently in remission

## 2024-05-05 NOTE — Assessment & Plan Note (Signed)
-   No complaint at this time, will consider adding gabapentin  or Lyrica If worsening neuropathy
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
## 2024-05-05 NOTE — Progress Notes (Signed)
 Patient given incentive spirometer and received education with successful return demonstration.
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
## 2024-05-05 NOTE — Assessment & Plan Note (Addendum)
 Subtle fracture,  Based on CT chest: minimally displaced mid-sternal fracture.   -Appreciate trauma team evaluation and recommendation -Continue scheduled and as needed analgesics
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
## 2024-05-05 NOTE — Assessment & Plan Note (Signed)
 History of breast cancer, status post treatment in 2013 Currently in remission
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
## 2024-05-06 ENCOUNTER — Other Ambulatory Visit (HOSPITAL_COMMUNITY): Payer: Self-pay

## 2024-05-06 DIAGNOSIS — S2222XA Fracture of body of sternum, initial encounter for closed fracture: Secondary | ICD-10-CM | POA: Diagnosis not present

## 2024-05-06 LAB — GLUCOSE, CAPILLARY
Glucose-Capillary: 148 mg/dL — ABNORMAL HIGH (ref 70–99)
Glucose-Capillary: 253 mg/dL — ABNORMAL HIGH (ref 70–99)

## 2024-05-06 LAB — HEMOGLOBIN A1C
Hgb A1c MFr Bld: 6.8 % — ABNORMAL HIGH (ref 4.8–5.6)
Mean Plasma Glucose: 148.46 mg/dL

## 2024-05-06 MED ORDER — SENNOSIDES-DOCUSATE SODIUM 8.6-50 MG PO TABS
1.0000 | ORAL_TABLET | Freq: Every evening | ORAL | 0 refills | Status: AC | PRN
  Filled 2024-05-06: qty 15, 15d supply, fill #0

## 2024-05-06 MED ORDER — METHOCARBAMOL 500 MG PO TABS
500.0000 mg | ORAL_TABLET | Freq: Four times a day (QID) | ORAL | 0 refills | Status: AC | PRN
  Filled 2024-05-06: qty 40, 10d supply, fill #0

## 2024-05-06 MED ORDER — ACETAMINOPHEN 500 MG PO TABS: 1000.0000 mg | ORAL_TABLET | Freq: Four times a day (QID) | ORAL | Status: AC | PRN

## 2024-05-06 MED ORDER — ACETAMINOPHEN 500 MG PO TABS: 1000.0000 mg | ORAL_TABLET | Freq: Four times a day (QID) | ORAL | Status: DC | PRN

## 2024-05-06 MED ORDER — IBUPROFEN 200 MG PO TABS: 600.0000 mg | ORAL_TABLET | Freq: Three times a day (TID) | ORAL | Status: DC | PRN

## 2024-05-06 MED ORDER — IBUPROFEN 200 MG PO TABS: 600.0000 mg | ORAL_TABLET | Freq: Three times a day (TID) | ORAL | Status: AC | PRN

## 2024-05-06 MED ORDER — OXYCODONE HCL 5 MG PO TABS
5.0000 mg | ORAL_TABLET | ORAL | 0 refills | Status: AC | PRN
  Filled 2024-05-06: qty 20, 4d supply, fill #0
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
## 2024-05-06 NOTE — TOC CAGE-AID Note (Signed)
 Transition of Care [facility]) - CAGE-AID Screening   Patient Details  Name: Rachael Weaver MRN: [id number] Date of Birth: 1950/08/04  Transition of Care [facility]) CM/SW Contact:    LEBRON ROCKIE ORN, RN Phone Number: [phone] 05/06/2024, 1:38 PM   Clinical Narrative: Pt denies illicit drug use and reports current occasional alcohol use. No resources needed.     CAGE-AID Screening:    Have You Ever Felt You Ought to Cut Down on Your Drinking or Drug Use?: No Have People Annoyed You By Critizing Your Drinking Or Drug Use?: No Have You Felt Bad Or Guilty About Your Drinking Or Drug Use?: No Have You Ever Had a Drink or Used Drugs First Thing In The Morning to Steady Your Nerves or to Get Rid of a Hangover?: No CAGE-AID Score: 0  Substance Abuse Education Offered: No
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
## 2024-05-06 NOTE — Care Management Obs Status (Signed)
 MEDICARE OBSERVATION STATUS NOTIFICATION   Patient Details  Name: Rachael Weaver MRN: [id number] Date of Birth: 19-Sep-1950   Medicare Observation Status Notification Given:  Yes    [name], [name] 05/06/2024, 12:11 PM

## 2024-05-06 NOTE — Discharge Instructions (Signed)
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
## 2024-05-06 NOTE — Progress Notes (Signed)
 Patient has been discharged per MD order,  IV removed, tolerated well. AVS instructions reviewed with pt, verbalized understanding. TOC medications will be picked up on the way out. Pt will go to d/c lounge to await pick up.

## 2024-05-06 NOTE — Plan of Care (Signed)
" °  Problem: Education: Goal: Ability to describe self-care measures that may prevent or decrease complications (Diabetes Survival Skills Education) will improve Outcome: Progressing   Problem: Coping: Goal: Ability to adjust to condition or change in health will improve Outcome: Progressing   Problem: Activity: Goal: Risk for activity intolerance will decrease Outcome: Progressing   Problem: Pain Managment: Goal: General experience of comfort will improve and/or be controlled Outcome: Progressing   "

## 2024-05-19 ENCOUNTER — Other Ambulatory Visit: Payer: Self-pay

## 2024-05-19 ENCOUNTER — Encounter: Payer: Self-pay | Admitting: Sports Medicine

## 2024-05-19 ENCOUNTER — Ambulatory Visit: Admitting: Sports Medicine

## 2024-05-19 DIAGNOSIS — S2231XA Fracture of one rib, right side, initial encounter for closed fracture: Secondary | ICD-10-CM

## 2024-05-19 DIAGNOSIS — S2220XA Unspecified fracture of sternum, initial encounter for closed fracture: Secondary | ICD-10-CM

## 2024-05-19 DIAGNOSIS — R0789 Other chest pain: Secondary | ICD-10-CM | POA: Diagnosis not present

## 2024-05-19 DIAGNOSIS — M25511 Pain in right shoulder: Secondary | ICD-10-CM
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
## 2024-06-20 ENCOUNTER — Ambulatory Visit: Admitting: Sports Medicine
